# Patient Record
Sex: Male | Born: 1935
Health system: Southern US, Community
[De-identification: ages and names within clinical notes are randomized; demographics above are authoritative.]

## PROBLEM LIST (undated history)

## (undated) DIAGNOSIS — I2510 Atherosclerotic heart disease of native coronary artery without angina pectoris: Secondary | ICD-10-CM

## (undated) DIAGNOSIS — I639 Cerebral infarction, unspecified: Secondary | ICD-10-CM

## (undated) DIAGNOSIS — C61 Malignant neoplasm of prostate: Secondary | ICD-10-CM

## (undated) DIAGNOSIS — C801 Malignant (primary) neoplasm, unspecified: Secondary | ICD-10-CM

## (undated) DIAGNOSIS — I309 Acute pericarditis, unspecified: Secondary | ICD-10-CM

## (undated) DIAGNOSIS — I729 Aneurysm of unspecified site: Secondary | ICD-10-CM

## (undated) DIAGNOSIS — I10 Essential (primary) hypertension: Secondary | ICD-10-CM

## (undated) DIAGNOSIS — E7800 Pure hypercholesterolemia, unspecified: Secondary | ICD-10-CM

## (undated) DIAGNOSIS — I4821 Permanent atrial fibrillation: Secondary | ICD-10-CM

## (undated) DIAGNOSIS — I340 Nonrheumatic mitral (valve) insufficiency: Secondary | ICD-10-CM

## (undated) DIAGNOSIS — R7303 Prediabetes: Secondary | ICD-10-CM

## (undated) HISTORY — DX: Acute pericarditis, unspecified: I30.9

## (undated) HISTORY — DX: Nonrheumatic mitral (valve) insufficiency: I34.0

## (undated) HISTORY — DX: Cerebral infarction, unspecified: I63.9

## (undated) HISTORY — DX: Aneurysm of unspecified site: I72.9

## (undated) HISTORY — PX: SKIN BIOPSY: SHX1

## (undated) HISTORY — DX: Permanent atrial fibrillation: I48.21

## (undated) HISTORY — DX: Malignant neoplasm of prostate: C61

## (undated) HISTORY — DX: Atherosclerotic heart disease of native coronary artery without angina pectoris: I25.10

## (undated) HISTORY — PX: PROSTATE SURGERY: SHX751

---

## 2005-09-18 ENCOUNTER — Ambulatory Visit: Payer: Self-pay | Admitting: Gastroenterology

## 2005-11-09 ENCOUNTER — Ambulatory Visit: Payer: Self-pay | Admitting: Urology

## 2005-11-22 ENCOUNTER — Ambulatory Visit: Payer: Self-pay | Admitting: Radiation Oncology

## 2005-12-06 ENCOUNTER — Ambulatory Visit: Payer: Self-pay | Admitting: Radiation Oncology

## 2005-12-23 ENCOUNTER — Ambulatory Visit: Payer: Self-pay | Admitting: Radiation Oncology

## 2006-01-23 ENCOUNTER — Ambulatory Visit: Payer: Self-pay | Admitting: Radiation Oncology

## 2006-02-23 ENCOUNTER — Ambulatory Visit: Payer: Self-pay | Admitting: Radiation Oncology

## 2006-06-13 ENCOUNTER — Ambulatory Visit: Payer: Self-pay | Admitting: Radiation Oncology

## 2006-06-25 ENCOUNTER — Ambulatory Visit: Payer: Self-pay | Admitting: Radiation Oncology

## 2006-12-12 ENCOUNTER — Ambulatory Visit: Payer: Self-pay | Admitting: Radiation Oncology

## 2006-12-24 ENCOUNTER — Ambulatory Visit: Payer: Self-pay | Admitting: Radiation Oncology

## 2007-05-21 IMAGING — NM NUCLEAR MEDICINE WHOLE BODY BONE SCINTIGRAPHY
2 series · 6 of 6 positions shown · non-contrast
Comparison: none

REASON FOR EXAM: Prostate cancer
COMMENTS:

PROCEDURE:     NM  - NM BONE WB 3 HR [DATE]  [DATE]
RESULT:       [DATE] mCi Tc 99m MDP was administered.  No bony lesions are
identified.  Bilateral renal function excretion is present.

[Series 1: bone statics · 2.40mm/px · 2 acquisitions, 4 frames shown]
[im 1/2]
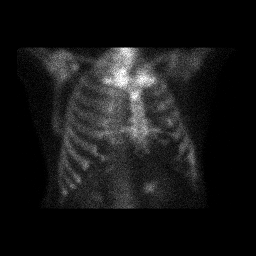
[im 1/2]
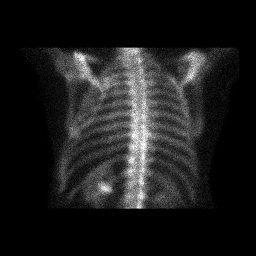
[im 2/2]
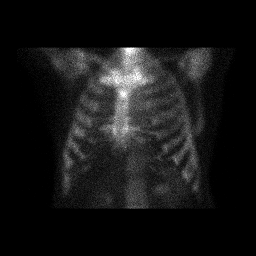
[im 2/2]
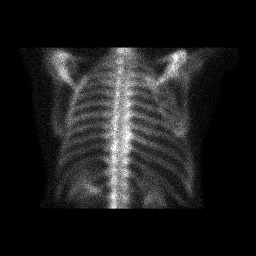

[Series 1: 3 hr wholebody · 2.40mm/px · 2 of 2 frames shown]
[frame 1/2]
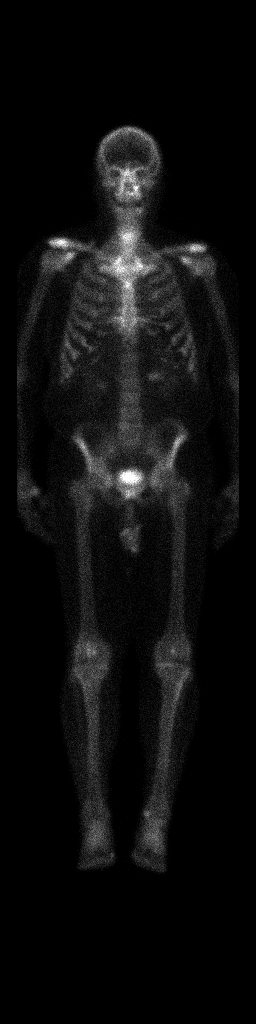
[frame 2/2]
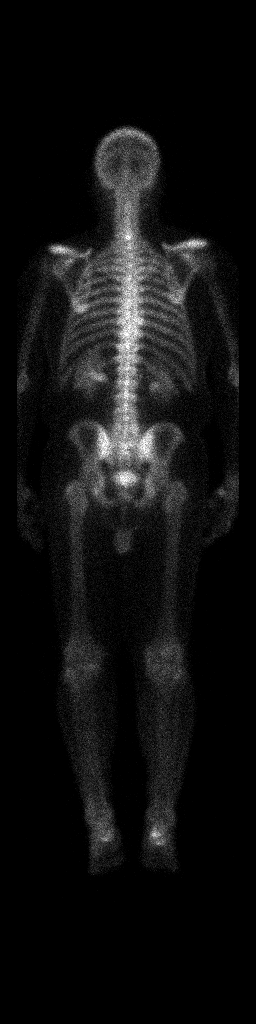

[6 of 6 positions shown; findings below may reference images not displayed]

IMPRESSION: Normal exam.  No focal abnormality is identified.
Specifically, there is no evidence of metastatic disease.

## 2007-06-12 ENCOUNTER — Ambulatory Visit: Payer: Self-pay | Admitting: Radiation Oncology

## 2007-06-17 IMAGING — CT CT GUIDANCE PLACEMENT RAD THERAPY FIELDS
1 series · 16 of 32 positions shown, 20 images · non-contrast
Comparison: none

[Series 2: tx planning · axial · 0.98mm/px · z∈[-80,+160]mm · 16 of 107 slices shown, 20 images]
[im 7/107  soft-tissue]
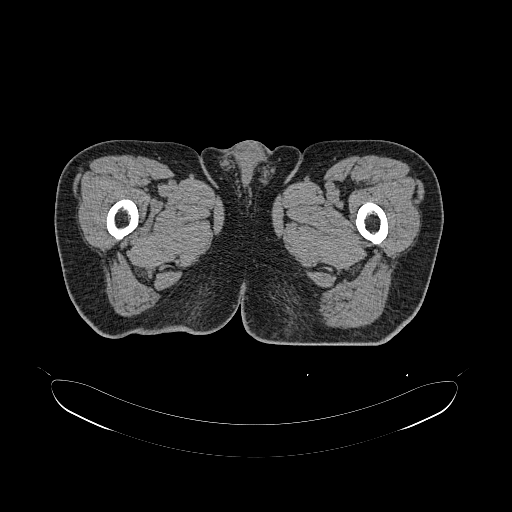
[im 7/107  bone]
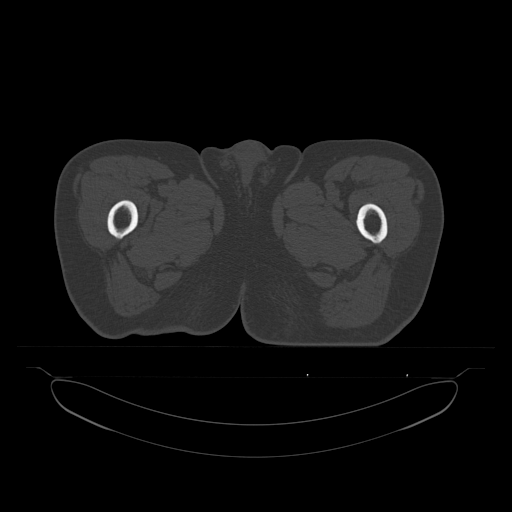
[im 14/107  soft-tissue]
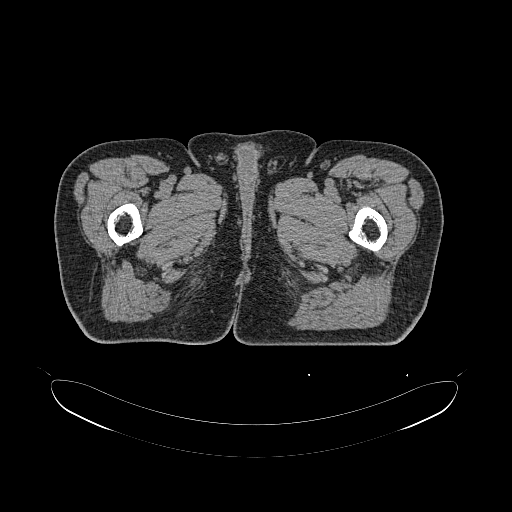
[im 21/107  soft-tissue]
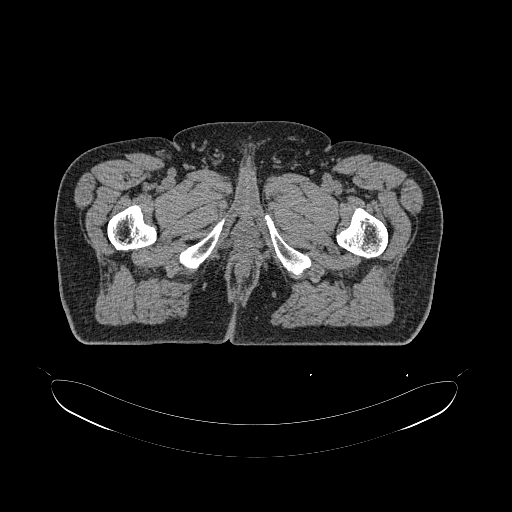
[im 28/107  soft-tissue]
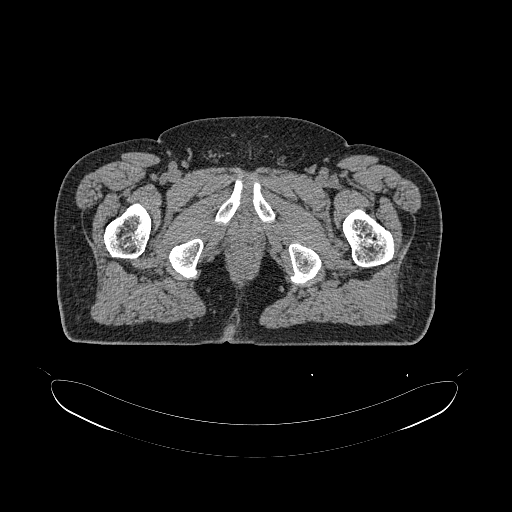
[im 35/107  soft-tissue]
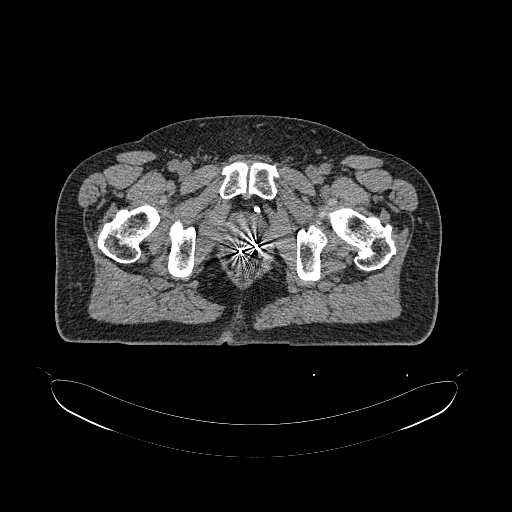
[im 42/107  soft-tissue]
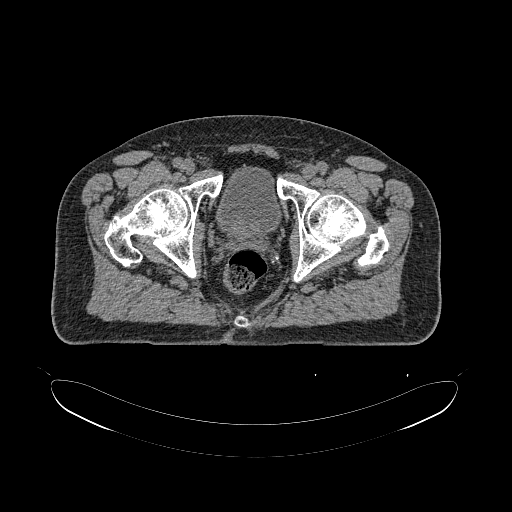
[im 48/107  soft-tissue]
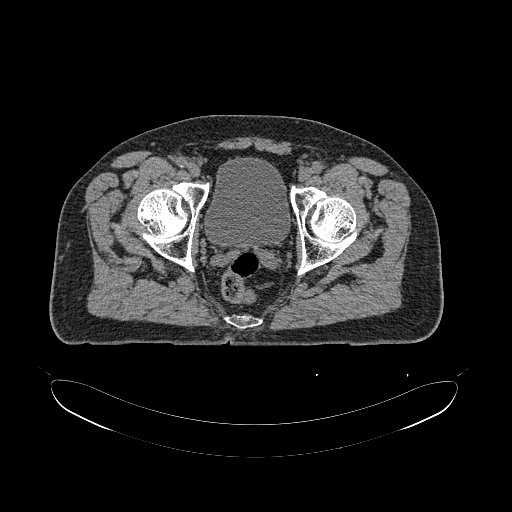
[im 59/107  soft-tissue]
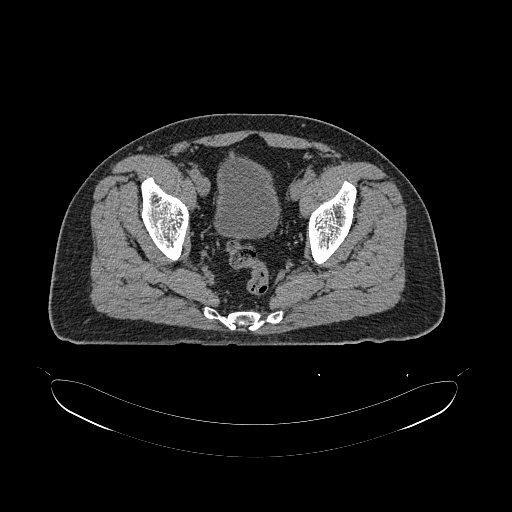
[im 65/107  soft-tissue]
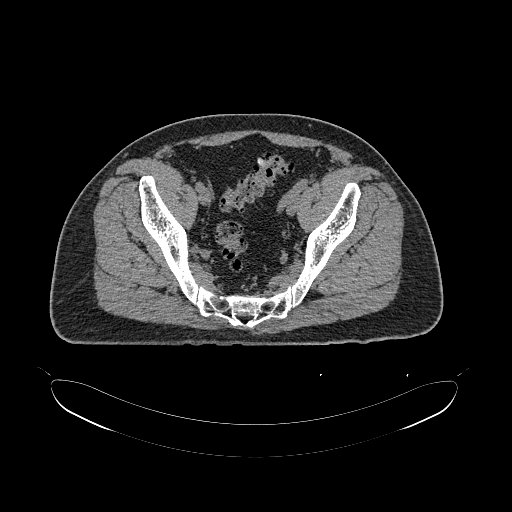
[im 65/107  bone]
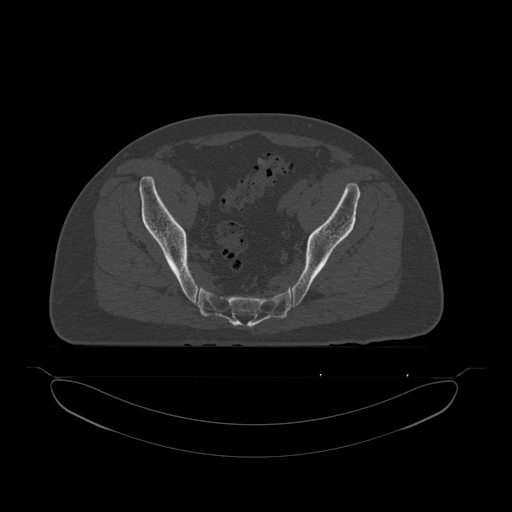
[im 72/107  soft-tissue]
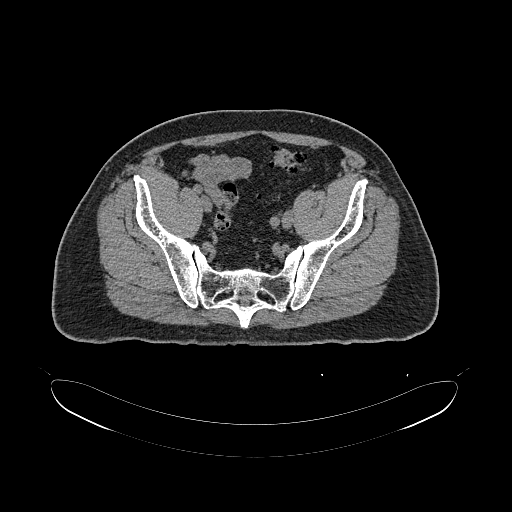
[im 79/107  soft-tissue]
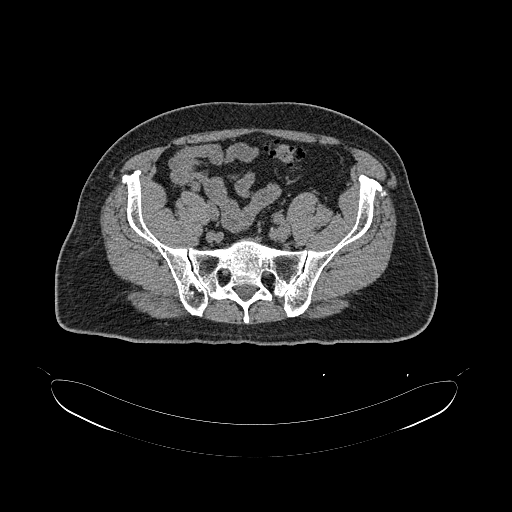
[im 86/107  soft-tissue]
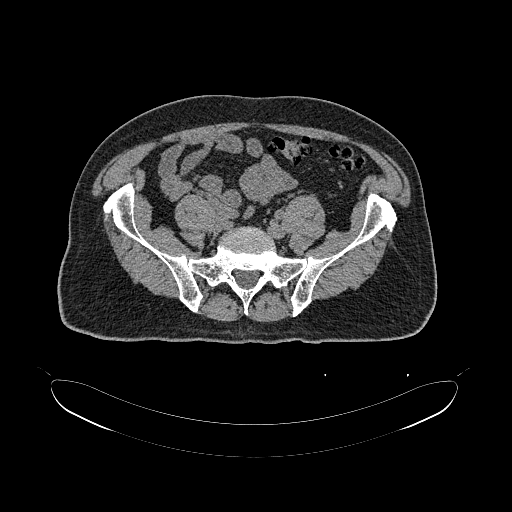
[im 93/107  soft-tissue]
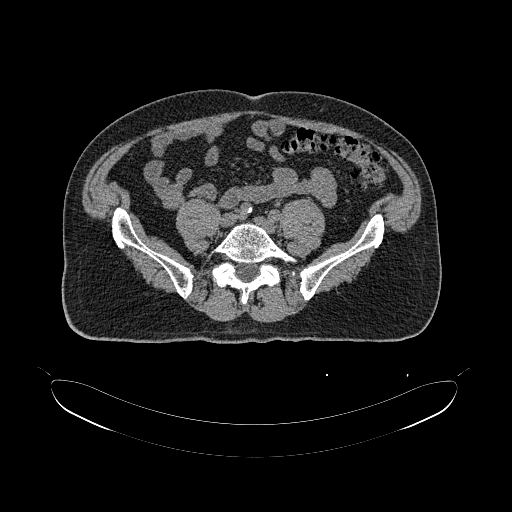
[im 93/107  lung]
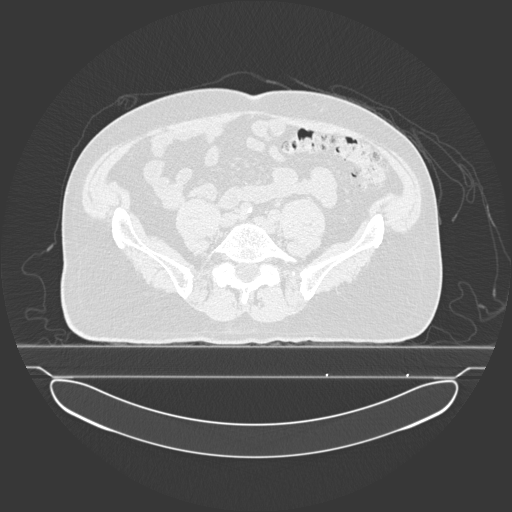
[im 96/107  lung]
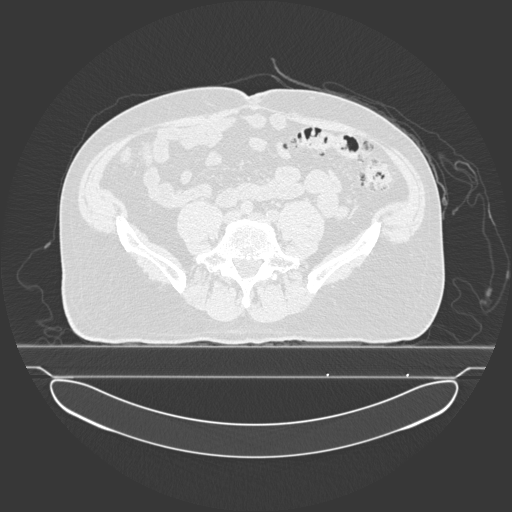
[im 100/107  soft-tissue]
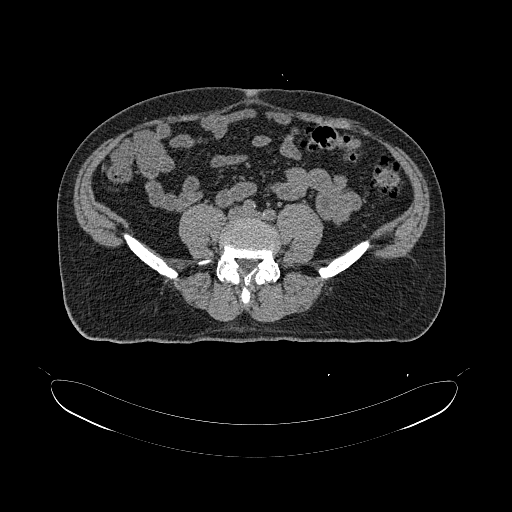
[im 100/107  lung]
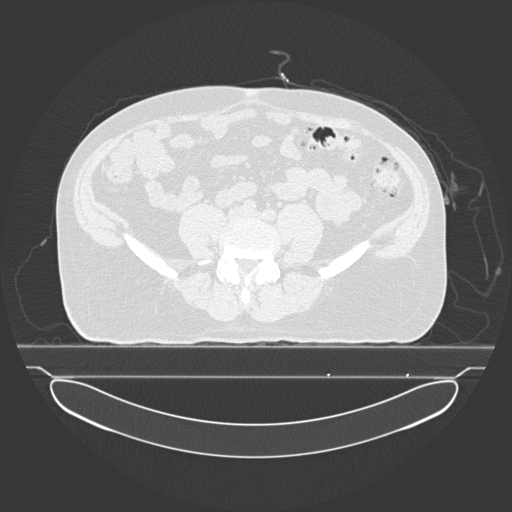
[im 103/107  lung]
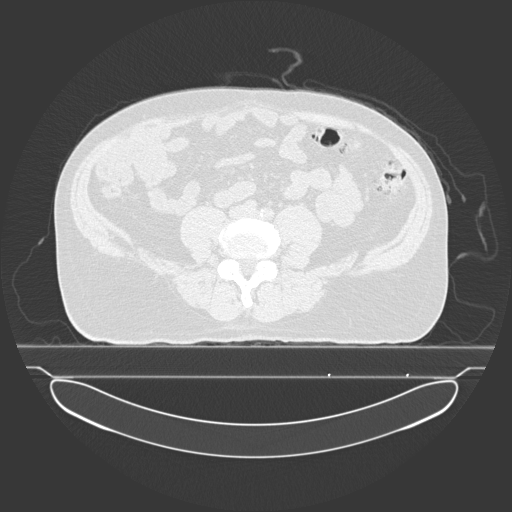

[16 of 32 positions shown; findings below may reference images not displayed]

IMAGES IMPORTED FROM THE [employer] WORKFLOW SYSTEM
NO DICTATION FOR STUDY

## 2007-06-26 ENCOUNTER — Ambulatory Visit: Payer: Self-pay | Admitting: Radiation Oncology

## 2007-11-24 ENCOUNTER — Ambulatory Visit: Payer: Self-pay | Admitting: Radiation Oncology

## 2008-06-25 ENCOUNTER — Ambulatory Visit: Payer: Self-pay | Admitting: Radiation Oncology

## 2008-07-02 ENCOUNTER — Ambulatory Visit: Payer: Self-pay | Admitting: Radiation Oncology

## 2008-07-26 ENCOUNTER — Ambulatory Visit: Payer: Self-pay | Admitting: Radiation Oncology

## 2009-04-27 ENCOUNTER — Ambulatory Visit: Payer: Self-pay | Admitting: Ophthalmology

## 2009-06-25 ENCOUNTER — Ambulatory Visit: Payer: Self-pay | Admitting: Radiation Oncology

## 2009-07-01 ENCOUNTER — Ambulatory Visit: Payer: Self-pay | Admitting: Radiation Oncology

## 2009-07-26 ENCOUNTER — Ambulatory Visit: Payer: Self-pay | Admitting: Radiation Oncology

## 2010-06-30 ENCOUNTER — Ambulatory Visit: Payer: Self-pay | Admitting: Radiation Oncology

## 2010-07-01 LAB — PSA

## 2010-07-26 ENCOUNTER — Ambulatory Visit: Payer: Self-pay | Admitting: Radiation Oncology

## 2012-07-09 DIAGNOSIS — N529 Male erectile dysfunction, unspecified: Secondary | ICD-10-CM | POA: Insufficient documentation

## 2012-07-14 DIAGNOSIS — Z8546 Personal history of malignant neoplasm of prostate: Secondary | ICD-10-CM | POA: Insufficient documentation

## 2014-04-21 DIAGNOSIS — R7303 Prediabetes: Secondary | ICD-10-CM | POA: Insufficient documentation

## 2014-04-21 DIAGNOSIS — E785 Hyperlipidemia, unspecified: Secondary | ICD-10-CM | POA: Insufficient documentation

## 2014-04-21 DIAGNOSIS — I10 Essential (primary) hypertension: Secondary | ICD-10-CM | POA: Insufficient documentation

## 2015-08-08 DIAGNOSIS — R3129 Other microscopic hematuria: Secondary | ICD-10-CM | POA: Diagnosis not present

## 2015-08-08 DIAGNOSIS — Z8546 Personal history of malignant neoplasm of prostate: Secondary | ICD-10-CM | POA: Diagnosis not present

## 2015-08-29 DIAGNOSIS — D485 Neoplasm of uncertain behavior of skin: Secondary | ICD-10-CM | POA: Diagnosis not present

## 2015-08-29 DIAGNOSIS — Z85828 Personal history of other malignant neoplasm of skin: Secondary | ICD-10-CM | POA: Diagnosis not present

## 2015-08-29 DIAGNOSIS — Z872 Personal history of diseases of the skin and subcutaneous tissue: Secondary | ICD-10-CM | POA: Diagnosis not present

## 2015-08-29 DIAGNOSIS — Z08 Encounter for follow-up examination after completed treatment for malignant neoplasm: Secondary | ICD-10-CM | POA: Diagnosis not present

## 2015-08-29 DIAGNOSIS — C44212 Basal cell carcinoma of skin of right ear and external auricular canal: Secondary | ICD-10-CM | POA: Diagnosis not present

## 2015-08-29 DIAGNOSIS — L570 Actinic keratosis: Secondary | ICD-10-CM | POA: Diagnosis not present

## 2015-08-29 DIAGNOSIS — Z1283 Encounter for screening for malignant neoplasm of skin: Secondary | ICD-10-CM | POA: Diagnosis not present

## 2015-09-06 DIAGNOSIS — Z8546 Personal history of malignant neoplasm of prostate: Secondary | ICD-10-CM | POA: Diagnosis not present

## 2015-09-06 DIAGNOSIS — N281 Cyst of kidney, acquired: Secondary | ICD-10-CM | POA: Diagnosis not present

## 2015-09-06 DIAGNOSIS — R3129 Other microscopic hematuria: Secondary | ICD-10-CM | POA: Diagnosis not present

## 2015-09-06 DIAGNOSIS — N35919 Unspecified urethral stricture, male, unspecified site: Secondary | ICD-10-CM | POA: Insufficient documentation

## 2015-09-06 DIAGNOSIS — R319 Hematuria, unspecified: Secondary | ICD-10-CM | POA: Diagnosis not present

## 2015-09-28 DIAGNOSIS — N509 Disorder of male genital organs, unspecified: Secondary | ICD-10-CM | POA: Diagnosis not present

## 2015-09-28 DIAGNOSIS — Z01818 Encounter for other preprocedural examination: Secondary | ICD-10-CM | POA: Diagnosis not present

## 2015-09-28 DIAGNOSIS — R319 Hematuria, unspecified: Secondary | ICD-10-CM | POA: Diagnosis not present

## 2015-10-05 DIAGNOSIS — R319 Hematuria, unspecified: Secondary | ICD-10-CM | POA: Diagnosis not present

## 2015-10-05 DIAGNOSIS — N359 Urethral stricture, unspecified: Secondary | ICD-10-CM | POA: Diagnosis not present

## 2015-10-05 DIAGNOSIS — N4889 Other specified disorders of penis: Secondary | ICD-10-CM | POA: Diagnosis not present

## 2015-10-05 DIAGNOSIS — I10 Essential (primary) hypertension: Secondary | ICD-10-CM | POA: Diagnosis not present

## 2015-10-05 DIAGNOSIS — N489 Disorder of penis, unspecified: Secondary | ICD-10-CM | POA: Diagnosis not present

## 2015-10-05 DIAGNOSIS — N358 Other urethral stricture: Secondary | ICD-10-CM | POA: Diagnosis not present

## 2015-10-05 DIAGNOSIS — R001 Bradycardia, unspecified: Secondary | ICD-10-CM | POA: Diagnosis not present

## 2015-10-05 DIAGNOSIS — R3121 Asymptomatic microscopic hematuria: Secondary | ICD-10-CM | POA: Diagnosis not present

## 2015-10-05 DIAGNOSIS — N4829 Other inflammatory disorders of penis: Secondary | ICD-10-CM | POA: Diagnosis not present

## 2015-10-05 DIAGNOSIS — Z880 Allergy status to penicillin: Secondary | ICD-10-CM | POA: Diagnosis not present

## 2015-10-05 DIAGNOSIS — E785 Hyperlipidemia, unspecified: Secondary | ICD-10-CM | POA: Diagnosis not present

## 2015-10-12 DIAGNOSIS — C44212 Basal cell carcinoma of skin of right ear and external auricular canal: Secondary | ICD-10-CM | POA: Diagnosis not present

## 2015-10-20 DIAGNOSIS — R319 Hematuria, unspecified: Secondary | ICD-10-CM | POA: Diagnosis not present

## 2015-10-20 DIAGNOSIS — Z9889 Other specified postprocedural states: Secondary | ICD-10-CM | POA: Diagnosis not present

## 2015-11-03 ENCOUNTER — Ambulatory Visit
Admission: EM | Admit: 2015-11-03 | Discharge: 2015-11-03 | Disposition: A | Discharge status: Home / Self Care | Payer: PPO | Attending: Family Medicine | Admitting: Family Medicine

## 2015-11-03 ENCOUNTER — Encounter: Payer: Self-pay | Admitting: Gynecology

## 2015-11-03 DIAGNOSIS — L089 Local infection of the skin and subcutaneous tissue, unspecified: Secondary | ICD-10-CM

## 2015-11-03 DIAGNOSIS — S90561A Insect bite (nonvenomous), right ankle, initial encounter: Secondary | ICD-10-CM | POA: Diagnosis not present

## 2015-11-03 DIAGNOSIS — W57XXXA Bitten or stung by nonvenomous insect and other nonvenomous arthropods, initial encounter: Principal | ICD-10-CM

## 2015-11-03 HISTORY — DX: Malignant (primary) neoplasm, unspecified: C80.1

## 2015-11-03 HISTORY — DX: Pure hypercholesterolemia, unspecified: E78.00

## 2015-11-03 HISTORY — DX: Essential (primary) hypertension: I10

## 2015-11-03 HISTORY — DX: Prediabetes: R73.03

## 2015-11-03 MED ORDER — DOXYCYCLINE HYCLATE 100 MG PO CAPS: 100.0000 mg | ORAL_CAPSULE | Freq: Two times a day (BID) | ORAL | Status: DC

## 2015-11-03 MED ORDER — MUPIROCIN 2 % EX OINT: 1.0000 "application " | TOPICAL_OINTMENT | Freq: Three times a day (TID) | CUTANEOUS | Status: DC

## 2015-11-03 NOTE — Discharge Instructions (Signed)
Tick Bite Information Ticks are insects that attach themselves to the skin and draw blood for food. There are various types of ticks. Common types include wood ticks and deer ticks. Most ticks live in shrubs and grassy areas. Ticks can climb onto your body when you make contact with leaves or grass where the tick is waiting. The most common places on the body for ticks to attach themselves are the scalp, neck, armpits, waist, and groin. Most tick bites are harmless, but sometimes ticks carry germs that cause diseases. These germs can be spread to a person during the tick's feeding process. The chance of a disease spreading through a tick bite depends on:   The type of tick.  Time of year.   How long the tick is attached.   Geographic location.  HOW CAN YOU PREVENT TICK BITES? Take these steps to help prevent tick bites when you are outdoors:  Wear protective clothing. Long sleeves and long pants are best.   Wear white clothes so you can see ticks more easily.  Tuck your pant legs into your socks.   If walking on a trail, stay in the middle of the trail to avoid brushing against bushes.  Avoid walking through areas with long grass.  Put insect repellent on all exposed skin and along boot tops, pant legs, and sleeve cuffs.   Check clothing, hair, and skin repeatedly and before going inside.   Brush off any ticks that are not attached.  Take a shower or bath as soon as possible after being outdoors.  WHAT IS THE PROPER WAY TO REMOVE A TICK? Ticks should be removed as soon as possible to help prevent diseases caused by tick bites. 1. If latex gloves are available, put them on before trying to remove a tick.  2. Using fine-point tweezers, grasp the tick as close to the skin as possible. You may also use curved forceps or a tick removal tool. Grasp the tick as close to its head as possible. Avoid grasping the tick on its body. 3. Pull gently with steady upward pressure until  the tick lets go. Do not twist the tick or jerk it suddenly. This may break off the tick's head or mouth parts. 4. Do not squeeze or crush the tick's body. This could force disease-carrying fluids from the tick into your body.  5. After the tick is removed, wash the bite area and your hands with soap and water or other disinfectant such as alcohol. 6. Apply a small amount of antiseptic cream or ointment to the bite site.  7. Wash and disinfect any instruments that were used.  Do not try to remove a tick by applying a hot match, petroleum jelly, or fingernail polish to the tick. These methods do not work and may increase the chances of disease being spread from the tick bite.  WHEN SHOULD YOU SEEK MEDICAL CARE? Contact your health care provider if you are unable to remove a tick from your skin or if a part of the tick breaks off and is stuck in the skin.  After a tick bite, you need to be aware of signs and symptoms that could be related to diseases spread by ticks. Contact your health care provider if you develop any of the following in the days or weeks after the tick bite:  Unexplained fever.  Rash. A circular rash that appears days or weeks after the tick bite may indicate the possibility of Lyme disease. The rash may resemble   a target with a bull's-eye and may occur at a different part of your body than the tick bite.  Redness and swelling in the area of the tick bite.   Tender, swollen lymph glands.   Diarrhea.   Weight loss.   Cough.   Fatigue.   Muscle, joint, or bone pain.   Abdominal pain.   Headache.   Lethargy or a change in your level of consciousness.  Difficulty walking or moving your legs.   Numbness in the legs.   Paralysis.  Shortness of breath.   Confusion.   Repeated vomiting.    This information is not intended to replace advice given to you by your health care provider. Make sure you discuss any questions you have with your health  care provider.   Document Released: 06/08/2000 Document Revised: 07/02/2014 Document Reviewed: 11/19/2012 Elsevier Interactive Patient Education 2016 [employer]  

## 2015-11-03 NOTE — ED Provider Notes (Signed)
CSN: [id number]     Arrival date & time 11/03/15  [id number] History   First MD Initiated Contact with Patient 11/03/15 0945     Chief Complaint  Patient presents with  . Insect Bite   (Consider location/radiation/quality/duration/timing/severity/associated sxs/prior Treatment) HPI  82 year old male presents with a rash that is blanchable on the medial surface of his left distal lower leg just above the ankle. The patient states that he played in a golfing tournament on Friday and Saturday of last week had a couple of shots in the woods. He also spends a lot of time in the woods around his house and around a bird feeder that has high grass. He states he found the tick which was very small about 4 days ago and removed it. Shortly afterwards he began to have the blanchable rash and a red welt were the tick was removed. States that he is almost certain that the tick was in place for at least 4 days. He's had no fever is afebrile today at 97.6. Did not use a bug spray states that he will be doing that from now on.   Past Medical History  Diagnosis Date  . Hypertension   . Hypercholesteremia   . Pre-diabetes   . Cancer [facility])     prostate   Past Surgical History  Procedure Laterality Date  . Prostate surgery    . Skin biopsy      basel cell   No family history on file. Social History  Substance Use Topics  . Smoking status: Never Smoker   . Smokeless tobacco: None  . Alcohol Use: Yes    Review of Systems  Constitutional: Negative for fever, chills, activity change and fatigue.  Skin: Positive for color change, rash and wound.  All other systems reviewed and are negative.   Allergies  Penicillins  Home Medications   Prior to Admission medications   Medication Sig Start Date End Date Taking? Authorizing Provider  hydrochlorothiazide (HYDRODIURIL) 25 MG tablet Take 25 mg by mouth daily.   Yes Historical Provider, MD  ibuprofen (ADVIL,MOTRIN) 200 MG tablet Take 200 mg by mouth every 6  (six) hours as needed.   Yes Historical Provider, MD  lisinopril (PRINIVIL,ZESTRIL) 20 MG tablet Take 20 mg by mouth daily.   Yes Historical Provider, MD  simvastatin (ZOCOR) 20 MG tablet Take 20 mg by mouth daily.   Yes Historical Provider, MD  doxycycline (VIBRAMYCIN) 100 MG capsule Take 1 capsule (100 mg total) by mouth 2 (two) times daily. 11/03/15   Lorin Picket, PA-C  mupirocin ointment (BACTROBAN) 2 % Apply 1 application topically 3 (three) times daily. 11/03/15   Lorin Picket, PA-C   Meds Ordered and Administered this Visit  Medications - No data to display  BP 128/79 mmHg  Pulse 80  Temp(Src) 97.6 F (36.4 C) (Oral)  Resp 16  Ht 5\' 8"  (1.727 m)  Wt 210 lb (95.255 kg)  BMI 31.94 kg/m2  SpO2 98% No data found.   Physical Exam  Constitutional: He is oriented to person, place, and time. He appears well-nourished. No distress.  HENT:  Head: Normocephalic and atraumatic.  Eyes: Conjunctivae are normal. Pupils are equal, round, and reactive to light.  Neck: Normal range of motion. Neck supple.  Musculoskeletal: Normal range of motion. He exhibits edema and tenderness.  Lymphadenopathy:    He has no cervical adenopathy.  Neurological: He is alert and oriented to person, place, and time.  Skin: Skin is warm and  dry. Rash noted. He is not diaphoretic. There is erythema.  Examination of the distal right leg medially just proximal to the ankle shows a small well with surrounding blanchable erythema. There is some mild swelling present. There is no drainage from the bite is some induration below measuring approximately 1/2-3/4 of a centimeter.  Psychiatric: He has a normal mood and affect. His behavior is normal. Judgment and thought content normal.  Nursing note and vitals reviewed.   ED Course  Procedures (including critical care time)  Labs Review Labs Reviewed - No data to display  Imaging Review No results found.   Visual Acuity Review  Right Eye Distance:    Left Eye Distance:   Bilateral Distance:    Right Eye Near:   Left Eye Near:    Bilateral Near:         MDM   1. Insect bite of ankle, right, infected, initial encounter    Discharge Medication List as of 11/03/2015 10:05 AM    START taking these medications   Details  doxycycline (VIBRAMYCIN) 100 MG capsule Take 1 capsule (100 mg total) by mouth 2 (two) times daily., Starting 11/03/2015, Until Discontinued, Normal    mupirocin ointment (BACTROBAN) 2 % Apply 1 application topically 3 (three) times daily., Starting 11/03/2015, Until Discontinued, Normal      Plan: 1. Test/x-ray results and diagnosis reviewed with patient 2. rx as per orders; risks, benefits, potential side effects reviewed with patient 3. Recommend supportive treatment with Compresses and Bactroban to the area 3 times a day to 4 times a day. Because we are uncertain of the exact amount time that the tick was feeding we will place him on doxycycline prophylactically. I have cautioned him on the use of bug spray whenever he is out of doors. Is particularly of crucial when he is golfing hour in the woods. He is having any further problems or concerns he should follow-up either with Dr. Elveria Rising dermatologist or his primary care physician. 4. F/u prn if symptoms worsen or don't improve     Lorin Picket, PA-C 11/03/15 1046

## 2015-11-03 NOTE — ED Notes (Signed)
Patient stated tick bite on right lower leg x 4 days ago. Per patient remove tick , but now with redness and rash around area.

## 2015-11-16 DIAGNOSIS — E7800 Pure hypercholesterolemia, unspecified: Secondary | ICD-10-CM | POA: Diagnosis not present

## 2015-11-16 DIAGNOSIS — I10 Essential (primary) hypertension: Secondary | ICD-10-CM | POA: Diagnosis not present

## 2015-11-16 DIAGNOSIS — R7303 Prediabetes: Secondary | ICD-10-CM | POA: Diagnosis not present

## 2015-12-07 ENCOUNTER — Ambulatory Visit (INDEPENDENT_AMBULATORY_CARE_PROVIDER_SITE_OTHER): Payer: PPO | Admitting: Family Medicine

## 2015-12-07 ENCOUNTER — Encounter: Payer: Self-pay | Admitting: Family Medicine

## 2015-12-07 VITALS — BP 124/80 | HR 72 | Resp 16 | Ht 68.0 in | Wt 217.0 lb

## 2015-12-07 DIAGNOSIS — R2681 Unsteadiness on feet: Secondary | ICD-10-CM | POA: Diagnosis not present

## 2015-12-07 DIAGNOSIS — D649 Anemia, unspecified: Secondary | ICD-10-CM | POA: Insufficient documentation

## 2015-12-07 DIAGNOSIS — Z8546 Personal history of malignant neoplasm of prostate: Secondary | ICD-10-CM | POA: Diagnosis not present

## 2015-12-07 DIAGNOSIS — I10 Essential (primary) hypertension: Secondary | ICD-10-CM

## 2015-12-07 DIAGNOSIS — E669 Obesity, unspecified: Secondary | ICD-10-CM

## 2015-12-07 DIAGNOSIS — Z23 Encounter for immunization: Secondary | ICD-10-CM | POA: Diagnosis not present

## 2015-12-07 DIAGNOSIS — E785 Hyperlipidemia, unspecified: Secondary | ICD-10-CM | POA: Diagnosis not present

## 2015-12-07 DIAGNOSIS — R3129 Other microscopic hematuria: Secondary | ICD-10-CM | POA: Diagnosis not present

## 2015-12-07 DIAGNOSIS — R7303 Prediabetes: Secondary | ICD-10-CM | POA: Diagnosis not present

## 2015-12-07 MED ORDER — ZOSTER VACCINE LIVE 19400 UNT/0.65ML ~~LOC~~ SUSR: 0.6500 mL | Freq: Once | SUBCUTANEOUS | Status: DC

## 2015-12-08 LAB — VITAMIN D 25 HYDROXY (VIT D DEFICIENCY, FRACTURES): VIT D 25 HYDROXY: 22.5 ng/mL — AB (ref 30.0–100.0)

## 2015-12-08 LAB — VITAMIN B12: Vitamin B-12: 340 pg/mL (ref 211–946)

## 2015-12-09 ENCOUNTER — Other Ambulatory Visit: Payer: Self-pay | Admitting: Family Medicine

## 2015-12-09 DIAGNOSIS — Z8546 Personal history of malignant neoplasm of prostate: Secondary | ICD-10-CM | POA: Insufficient documentation

## 2015-12-09 DIAGNOSIS — E559 Vitamin D deficiency, unspecified: Secondary | ICD-10-CM

## 2015-12-09 MED ORDER — ACETAMINOPHEN 500 MG PO TABS: 500.0000 mg | ORAL_TABLET | Freq: Four times a day (QID) | ORAL | Status: AC | PRN

## 2015-12-09 MED ORDER — VITAMIN D 50 MCG (2000 UT) PO CAPS: 1.0000 | ORAL_CAPSULE | Freq: Every day | ORAL | Status: AC

## 2015-12-09 NOTE — Progress Notes (Signed)
Date:  12/07/2015   Name:  Jonathan [name]   DOB:  06-07-1934   MRN:  [id number]  PCP:  No primary care provider on file.    Chief Complaint: Establish Care   History of Present Illness:  This is a 82 y.o. male seen for initial visit. Seen [place] for tick bite last month, rx'd with doxy/Bactroban. HTN on lisinopril/HCTZ, HLD on Zocor. Hx prediabetes, chronic normocytic anemia. Hx prostate ca and persistent microscopic hematuria, followed yearly by urology. S/p cataract surgery, followed yearly by ophtho. Sees derm q63m for BCC/SCC. Father died heart dz, mother old age. Tet imm < 5 yrs ago, never had zoster or pneumo imms. Colonoscopy 8 yrs ago ok. Has BLE edema that improves overnight.  Review of Systems:  Review of Systems  Constitutional: Negative for fever and fatigue.  Respiratory: Negative for cough and shortness of breath.   Cardiovascular: Negative for chest pain.  Endocrine: Negative for polyuria.  Genitourinary: Negative for difficulty urinating.  Neurological: Negative for syncope and light-headedness.    Patient Active Problem List   Diagnosis Date Noted  . Gait instability 12/07/2015  . Obesity (BMI 30.0-34.9) 12/07/2015  . Chronic anemia 12/07/2015  . 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 Microscopic hematuria 09/06/2015  . Benign hypertension 04/21/2014  . Hyperlipidemia 04/21/2014  . Prediabetes 04/21/2014  . H/O malignant neoplasm of prostate 07/14/2012  . ED (erectile dysfunction) of organic origin 07/09/2012    Prior to Admission medications   Medication Sig Start Date End Date Taking? Authorizing Provider  hydrochlorothiazide (HYDRODIURIL) 25 MG tablet Take 25 mg by mouth daily.   Yes Historical Provider, MD  ibuprofen (ADVIL,MOTRIN) 200 MG tablet Take 200 mg by mouth every 6 (six) hours as needed.   Yes Historical Provider, MD  lisinopril (PRINIVIL,ZESTRIL) 20 MG tablet Take 20 mg by mouth daily.   Yes Historical Provider, MD  simvastatin (ZOCOR) 20 MG tablet Take 20 mg  by mouth daily.   Yes Historical Provider, MD  Zoster Vaccine Live, PF, (ZOSTAVAX) 16109 UNT/0.65ML injection Inject 19,400 Units into the skin once. 12/07/15   Adline Potter, MD    Allergies  Allergen Reactions  . Penicillin V Potassium Other (See Comments)  . Penicillins Rash    Past Surgical History  Procedure Laterality Date  . Prostate surgery    . Skin biopsy      basel cell    Social History  Substance Use Topics  . Smoking status: Never Smoker   . Smokeless tobacco: None  . Alcohol Use: Yes    No family history on file.  Medication list has been reviewed and updated.  Physical Examination: BP 124/80 mmHg  Pulse 72  Resp 16  Ht 5\' 8"  (1.727 m)  Wt 217 lb (98.431 kg)  BMI 33.00 kg/m2  SpO2 97%  Physical Exam  Constitutional: He is oriented to person, place, and time. He appears well-developed and well-nourished.  HENT:  Right Ear: External ear normal.  Left Ear: External ear normal.  Nose: Nose normal.  Mouth/Throat: Oropharynx is clear and moist.  TM's clear  Eyes: Conjunctivae and EOM are normal. Pupils are equal, round, and reactive to light.  Neck: Neck supple. No thyromegaly present.  Cardiovascular: Normal rate, regular rhythm, normal heart sounds and intact distal pulses.   Pulmonary/Chest: Effort normal and breath sounds normal.  Abdominal: Soft. He exhibits no distension and no mass. There is no tenderness.  Moderate midline ventral hernia  Musculoskeletal:  1+ BLE edema  Lymphadenopathy:    He  has no cervical adenopathy.  Neurological: He is alert and oriented to person, place, and time.  Gait slightly wide-based  Skin: Skin is warm and dry.  Psychiatric: He has a normal mood and affect. His behavior is normal.  Nursing note and vitals reviewed.   Assessment and Plan:  1. Benign hypertension Well controlled on lisinopril/HCTZ  2. Prediabetes A1c 5.8% last month  3. Hyperlipidemia Well controlled on Zocor (LDL 91 last month)  4.  Gait instability Consider PT referral next visit - B12  5. Chronic anemia H/H 13.5/39.5 last month  6. Obesity (BMI 30.0-34.9) Exercise/weight loss discussed - Vitamin D (25 hydroxy)  7. Need for pneumococcal vaccination Prevnar given, Zostavax ordered  8. History of prostate cancer Followed by urology  9. Microscopic hematuria Followed by urology  Return in about 6 months (around 06/07/2016).  [name]. [facility]  12/09/2015

## 2016-04-11 DIAGNOSIS — D485 Neoplasm of uncertain behavior of skin: Secondary | ICD-10-CM | POA: Diagnosis not present

## 2016-04-11 DIAGNOSIS — Z1283 Encounter for screening for malignant neoplasm of skin: Secondary | ICD-10-CM | POA: Diagnosis not present

## 2016-04-11 DIAGNOSIS — L821 Other seborrheic keratosis: Secondary | ICD-10-CM | POA: Diagnosis not present

## 2016-04-11 DIAGNOSIS — Z872 Personal history of diseases of the skin and subcutaneous tissue: Secondary | ICD-10-CM | POA: Diagnosis not present

## 2016-04-11 DIAGNOSIS — Z08 Encounter for follow-up examination after completed treatment for malignant neoplasm: Secondary | ICD-10-CM | POA: Diagnosis not present

## 2016-04-11 DIAGNOSIS — Z85828 Personal history of other malignant neoplasm of skin: Secondary | ICD-10-CM | POA: Diagnosis not present

## 2016-04-11 DIAGNOSIS — C44311 Basal cell carcinoma of skin of nose: Secondary | ICD-10-CM | POA: Diagnosis not present

## 2016-04-11 DIAGNOSIS — L570 Actinic keratosis: Secondary | ICD-10-CM | POA: Diagnosis not present

## 2016-04-25 DIAGNOSIS — C44311 Basal cell carcinoma of skin of nose: Secondary | ICD-10-CM | POA: Diagnosis not present

## 2016-06-08 ENCOUNTER — Ambulatory Visit (INDEPENDENT_AMBULATORY_CARE_PROVIDER_SITE_OTHER): Payer: PPO | Admitting: Family Medicine

## 2016-06-08 ENCOUNTER — Encounter: Payer: Self-pay | Admitting: Family Medicine

## 2016-06-08 VITALS — BP 132/82 | HR 68 | Resp 16 | Ht 68.0 in | Wt 217.0 lb

## 2016-06-08 DIAGNOSIS — I10 Essential (primary) hypertension: Secondary | ICD-10-CM | POA: Diagnosis not present

## 2016-06-08 DIAGNOSIS — M1990 Unspecified osteoarthritis, unspecified site: Secondary | ICD-10-CM | POA: Insufficient documentation

## 2016-06-08 DIAGNOSIS — M170 Bilateral primary osteoarthritis of knee: Secondary | ICD-10-CM | POA: Diagnosis not present

## 2016-06-08 DIAGNOSIS — D649 Anemia, unspecified: Secondary | ICD-10-CM

## 2016-06-08 DIAGNOSIS — E559 Vitamin D deficiency, unspecified: Secondary | ICD-10-CM | POA: Diagnosis not present

## 2016-06-08 DIAGNOSIS — S8011XA Contusion of right lower leg, initial encounter: Secondary | ICD-10-CM

## 2016-06-08 DIAGNOSIS — R7303 Prediabetes: Secondary | ICD-10-CM

## 2016-06-08 DIAGNOSIS — E669 Obesity, unspecified: Secondary | ICD-10-CM | POA: Diagnosis not present

## 2016-06-08 DIAGNOSIS — E785 Hyperlipidemia, unspecified: Secondary | ICD-10-CM | POA: Diagnosis not present

## 2016-06-08 DIAGNOSIS — E66811 Obesity, class 1: Secondary | ICD-10-CM

## 2016-06-08 MED ORDER — HYDROCHLOROTHIAZIDE 25 MG PO TABS: 25.0000 mg | ORAL_TABLET | Freq: Every day | ORAL | 3 refills | Status: DC

## 2016-06-08 MED ORDER — LISINOPRIL 20 MG PO TABS: 20.0000 mg | ORAL_TABLET | Freq: Every day | ORAL | 3 refills | Status: DC

## 2016-06-08 NOTE — Progress Notes (Signed)
Date:  06/08/2016   Name:  Jonathan [name]   DOB:  03/08/1934   MRN:  [id number]  PCP:  No primary care provider on file.    Chief Complaint: Hypertension   History of Present Illness:  This is a 82 y.o. male seen for six month f/u. HTN well controlled on lisinopril/HCTZ, HLD on Zocor (LDL 98, no established CVD), prediabetes stable (last a1c 5.9%). Weight about the same, going to gym more. C/o intermittent B knee pain. Taking vit D supplement. One month ago hit R lower leg, hematoma with distal migration or ecchymosis, both improving.  Review of Systems:  Review of Systems  Constitutional: Negative for appetite change, fatigue, fever and unexpected weight change.  Respiratory: Negative for cough and shortness of breath.   Cardiovascular: Negative for chest pain and leg swelling.  Gastrointestinal: Negative for abdominal pain.  Neurological: Negative for dizziness, syncope and light-headedness.  Hematological: Negative for adenopathy.    Patient Active Problem List   Diagnosis Date Noted  . History of prostate cancer 12/09/2015  . Vitamin D deficiency 12/09/2015  . Gait instability 12/07/2015  . Obesity (BMI 30.0-34.9) 12/07/2015  . Chronic anemia 12/07/2015  . 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 Microscopic hematuria 09/06/2015  . Benign hypertension 04/21/2014  . Hyperlipidemia 04/21/2014  . Prediabetes 04/21/2014  . ED (erectile dysfunction) of organic origin 07/09/2012    Prior to Admission medications   Medication Sig Start Date End Date Taking? Authorizing Provider  acetaminophen (TYLENOL) 500 MG tablet Take 1 tablet (500 mg total) by mouth every 6 (six) hours as needed. 12/09/15  Yes Adline Potter, MD  Cholecalciferol (VITAMIN D) 2000 units CAPS Take 1 capsule (2,000 Units total) by mouth daily. 12/09/15  Yes Adline Potter, MD  hydrochlorothiazide (HYDRODIURIL) 25 MG tablet Take 25 mg by mouth daily.   Yes Historical Provider, MD  lisinopril (PRINIVIL,ZESTRIL) 20 MG tablet Take  20 mg by mouth daily.   Yes Historical Provider, MD  simvastatin (ZOCOR) 20 MG tablet Take 20 mg by mouth daily.   Yes Historical Provider, MD    Allergies  Allergen Reactions  . Penicillin V Potassium Other (See Comments)  . Penicillins Rash    Past Surgical History:  Procedure Laterality Date  . PROSTATE SURGERY    . SKIN BIOPSY     basel cell    Social History  Substance Use Topics  . Smoking status: Never Smoker  . Smokeless tobacco: Never Used  . Alcohol use Yes    History reviewed. No pertinent family history.  Medication list has been reviewed and updated.  Physical Examination: BP 132/82   Pulse 68   Resp 16   Ht 5\' 8"  (1.727 m)   Wt 217 lb (98.4 kg)   SpO2 98%   BMI 32.99 kg/m   Physical Exam  Constitutional: He is oriented to person, place, and time. He appears well-developed and well-nourished.  Cardiovascular: Normal rate, regular rhythm and normal heart sounds.   Pulmonary/Chest: Effort normal and breath sounds normal.  Musculoskeletal: He exhibits no edema.  Hematoma R medial lower leg, resolving  Neurological: He is alert and oriented to person, place, and time.  Skin: Skin is warm and dry.  Psychiatric: He has a normal mood and affect. His behavior is normal.  Nursing note and vitals reviewed.   Assessment and Plan:  1. Hematoma of leg, right, initial encounter Resolving  2. Benign hypertension Well controlled, refill lisinopril and HCTZ - Comprehensive Metabolic Panel (CMET)  3. Hyperlipidemia, unspecified hyperlipidemia  type On Zocor with unclear indication, cont for now - Lipid Profile  4. Vitamin D deficiency On supplement - Vitamin D (25 hydroxy)  5. Prediabetes - HgB A1c  6. Obesity (BMI 30.0-34.9) Exercise/weight loss discussed - TSH  7. Chronic anemia - CBC  8. Primary osteoarthritis of both knees Consider G/C supplement  Return in about 6 months (around 12/07/2016).  [name]. [facility]  06/08/2016

## 2016-06-09 LAB — CBC
Hematocrit: 41.2 % (ref 37.5–51.0)
Hemoglobin: 13.5 g/dL (ref 13.0–17.7)
MCH: 29.7 pg (ref 26.6–33.0)
MCHC: 32.8 g/dL (ref 31.5–35.7)
MCV: 91 fL (ref 79–97)
Platelets: 267 10*3/uL (ref 150–379)
RBC: 4.55 x10E6/uL (ref 4.14–5.80)
RDW: 14.4 % (ref 12.3–15.4)
WBC: 5.4 10*3/uL (ref 3.4–10.8)

## 2016-06-09 LAB — HEMOGLOBIN A1C
Est. average glucose Bld gHb Est-mCnc: 123 mg/dL
Hgb A1c MFr Bld: 5.9 % — ABNORMAL HIGH (ref 4.8–5.6)

## 2016-06-09 LAB — LIPID PANEL
CHOLESTEROL TOTAL: 202 mg/dL — AB (ref 100–199)
Chol/HDL Ratio: 3.4 ratio units (ref 0.0–5.0)
HDL: 60 mg/dL (ref >39)
LDL CALC: 115 mg/dL — AB (ref 0–99)
TRIGLYCERIDES: 137 mg/dL (ref 0–149)
VLDL CHOLESTEROL CAL: 27 mg/dL (ref 5–40)

## 2016-06-09 LAB — TSH: TSH: 1.7 u[IU]/mL (ref 0.450–4.500)

## 2016-06-09 LAB — COMPREHENSIVE METABOLIC PANEL
A/G RATIO: 1.9 (ref 1.2–2.2)
ALBUMIN: 4.5 g/dL (ref 3.5–4.7)
ALT: 17 IU/L (ref 0–44)
AST: 16 IU/L (ref 0–40)
Alkaline Phosphatase: 110 IU/L (ref 39–117)
BILIRUBIN TOTAL: 1 mg/dL (ref 0.0–1.2)
BUN / CREAT RATIO: 19 (ref 10–24)
BUN: 19 mg/dL (ref 8–27)
CHLORIDE: 98 mmol/L (ref 96–106)
CO2: 25 mmol/L (ref 18–29)
Calcium: 9.3 mg/dL (ref 8.6–10.2)
Creatinine, Ser: 1.02 mg/dL (ref 0.76–1.27)
GFR calc non Af Amer: 69 mL/min/{1.73_m2} (ref >59)
GFR, EST AFRICAN AMERICAN: 80 mL/min/{1.73_m2} (ref >59)
GLUCOSE: 110 mg/dL — AB (ref 65–99)
Globulin, Total: 2.4 g/dL (ref 1.5–4.5)
POTASSIUM: 4.9 mmol/L (ref 3.5–5.2)
Sodium: 140 mmol/L (ref 134–144)
TOTAL PROTEIN: 6.9 g/dL (ref 6.0–8.5)

## 2016-06-09 LAB — VITAMIN D 25 HYDROXY (VIT D DEFICIENCY, FRACTURES): VIT D 25 HYDROXY: 37.7 ng/mL (ref 30.0–100.0)

## 2016-06-11 ENCOUNTER — Other Ambulatory Visit: Payer: Self-pay | Admitting: Family Medicine

## 2016-06-11 MED ORDER — SIMVASTATIN 20 MG PO TABS: 20.0000 mg | ORAL_TABLET | Freq: Every day | ORAL | 3 refills | Status: DC

## 2016-08-28 DIAGNOSIS — R3129 Other microscopic hematuria: Secondary | ICD-10-CM | POA: Diagnosis not present

## 2016-08-28 DIAGNOSIS — Z8546 Personal history of malignant neoplasm of prostate: Secondary | ICD-10-CM | POA: Diagnosis not present

## 2016-10-24 DIAGNOSIS — L570 Actinic keratosis: Secondary | ICD-10-CM | POA: Diagnosis not present

## 2016-10-24 DIAGNOSIS — Z872 Personal history of diseases of the skin and subcutaneous tissue: Secondary | ICD-10-CM | POA: Diagnosis not present

## 2016-10-24 DIAGNOSIS — Z85828 Personal history of other malignant neoplasm of skin: Secondary | ICD-10-CM | POA: Diagnosis not present

## 2016-10-24 DIAGNOSIS — Z859 Personal history of malignant neoplasm, unspecified: Secondary | ICD-10-CM | POA: Diagnosis not present

## 2016-10-24 DIAGNOSIS — Z86018 Personal history of other benign neoplasm: Secondary | ICD-10-CM | POA: Diagnosis not present

## 2016-12-05 ENCOUNTER — Ambulatory Visit (INDEPENDENT_AMBULATORY_CARE_PROVIDER_SITE_OTHER): Payer: PPO | Admitting: Family Medicine

## 2016-12-05 ENCOUNTER — Encounter: Payer: Self-pay | Admitting: Family Medicine

## 2016-12-05 VITALS — BP 120/74 | HR 65 | Temp 98.0°F | Resp 17 | Ht 68.0 in | Wt 221.0 lb

## 2016-12-05 DIAGNOSIS — E559 Vitamin D deficiency, unspecified: Secondary | ICD-10-CM | POA: Diagnosis not present

## 2016-12-05 DIAGNOSIS — E785 Hyperlipidemia, unspecified: Secondary | ICD-10-CM

## 2016-12-05 DIAGNOSIS — I10 Essential (primary) hypertension: Secondary | ICD-10-CM | POA: Diagnosis not present

## 2016-12-05 DIAGNOSIS — S39012A Strain of muscle, fascia and tendon of lower back, initial encounter: Secondary | ICD-10-CM | POA: Diagnosis not present

## 2016-12-05 DIAGNOSIS — E669 Obesity, unspecified: Secondary | ICD-10-CM

## 2016-12-05 DIAGNOSIS — M170 Bilateral primary osteoarthritis of knee: Secondary | ICD-10-CM

## 2016-12-05 DIAGNOSIS — R7303 Prediabetes: Secondary | ICD-10-CM | POA: Diagnosis not present

## 2016-12-05 NOTE — Progress Notes (Signed)
Date:  12/05/2016   Name:  Jonathan [name]   DOB:  1933-11-04   MRN:  [id number]  PCP:  Adline Potter, MD    Chief Complaint: Hypertension   History of Present Illness:  This is a 83 y.o. male seen for six month f/u. Injured L back last week, still bothering. minimal radiation down L leg. OA stable with prn Tylenol. Taking vit D supp. Labs last visit showed stable prediabetes, normal vit D level, resolved anemia. Exercising regularly but not losing weight. Hematoma resolved. Some BLE edema during day but resolves overnight.  Review of Systems:  Review of Systems  Constitutional: Negative for chills and fever.  Respiratory: Negative for cough and shortness of breath.   Cardiovascular: Negative for chest pain and leg swelling.  Gastrointestinal: Negative for abdominal pain.  Endocrine: Negative for polydipsia and polyuria.  Genitourinary: Negative for difficulty urinating.  Neurological: Negative for syncope and light-headedness.    Patient Active Problem List   Diagnosis Date Noted  . Osteoarthritis 06/08/2016  . History of prostate cancer 12/09/2015  . Vitamin D deficiency 12/09/2015  . Gait instability 12/07/2015  . Obesity (BMI 30.0-34.9) 12/07/2015  . Chronic anemia 12/07/2015  . 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 Microscopic hematuria 09/06/2015  . Benign hypertension 04/21/2014  . Hyperlipidemia 04/21/2014  . Prediabetes 04/21/2014  . ED (erectile dysfunction) of organic origin 07/09/2012    Prior to Admission medications   Medication Sig Start Date End Date Taking? Authorizing Provider  acetaminophen (TYLENOL) 500 MG tablet Take 1 tablet (500 mg total) by mouth every 6 (six) hours as needed. 12/09/15  Yes Jaidon Ellery, Gwyndolyn Saxon, MD  Cholecalciferol (VITAMIN D) 2000 units CAPS Take 1 capsule (2,000 Units total) by mouth daily. 12/09/15  Yes Clee Pandit, Gwyndolyn Saxon, MD  hydrochlorothiazide (HYDRODIURIL) 25 MG tablet Take 1 tablet (25 mg total) by mouth daily. 06/08/16  Yes Farren Nelles, Gwyndolyn Saxon, MD   lisinopril (PRINIVIL,ZESTRIL) 20 MG tablet Take 1 tablet (20 mg total) by mouth daily. 06/08/16  Yes Sharda Keddy, Gwyndolyn Saxon, MD    Allergies  Allergen Reactions  . Penicillin V Potassium Other (See Comments)  . Penicillins Rash    Past Surgical History:  Procedure Laterality Date  . PROSTATE SURGERY    . SKIN BIOPSY     basel cell    Social History  Substance Use Topics  . Smoking status: Never Smoker  . Smokeless tobacco: Never Used  . Alcohol use Yes    No family history on file.  Medication list has been reviewed and updated.  Physical Examination: BP 120/74 (BP Location: Right Arm, Patient Position: Sitting, Cuff Size: Normal)   Pulse 65   Temp 98 F (36.7 C) (Oral)   Resp 17   Ht 5\' 8"  (1.727 m)   Wt 221 lb (100.2 kg)   SpO2 98%   BMI 33.60 kg/m   Physical Exam  Constitutional: He appears well-developed and well-nourished.  Cardiovascular: Normal rate, regular rhythm and normal heart sounds.   Pulmonary/Chest: Effort normal and breath sounds normal.  Musculoskeletal:  Trace BLE edema Negative B SLR  Neurological: He is alert.  Skin: Skin is warm and dry.  Psychiatric: He has a normal mood and affect. His behavior is normal.  Nursing note and vitals reviewed.   Assessment and Plan:  1. Benign hypertension Well controlled on lisinopril/HCTZ, consider combo pill next refill  2. Primary osteoarthritis of both knees Well controlled on Tylenol prn  3. Hyperlipidemia, unspecified hyperlipidemia type Recommend d/c as no established CVD  4. Prediabetes Stable,  repeat a1c next visit  5. Obesity (BMI 30.0-34.9) Weight up 4#, exercise/diet discussed, Med diet given  6. Vitamin D deficiency Well controlled on supplement  7. Lumbar strain, initial encounter Tylenol prn, call if sxs worsen/persist  Return in about 6 months (around 06/06/2017).  [name]. [facility]  12/05/2016

## 2016-12-05 NOTE — Patient Instructions (Signed)
Mediterranean Diet A Mediterranean diet refers to food and lifestyle choices that are based on the traditions of countries located on the [place]. This way of eating has been shown to help prevent certain conditions and improve outcomes for people who have chronic diseases, like kidney disease and heart disease. What are tips for following this plan? Lifestyle  Cook and eat meals together with your family, when possible.  Drink enough fluid to keep your urine clear or pale yellow.  Be physically active every day. This includes: ? Aerobic exercise like running or swimming. ? Leisure activities like gardening, walking, or housework.  Get 7-8 hours of sleep each night.  If recommended by your health care provider, drink red wine in moderation. This means 1 glass a day for nonpregnant women and 2 glasses a day for men. A glass of wine equals 5 oz (150 mL). Reading food labels  Check the serving size of packaged foods. For foods such as rice and pasta, the serving size refers to the amount of cooked product, not dry.  Check the total fat in packaged foods. Avoid foods that have saturated fat or trans fats.  Check the ingredients list for added sugars, such as corn syrup. Shopping  At the grocery store, buy most of your food from the areas near the walls of the store. This includes: ? Fresh fruits and vegetables (produce). ? Grains, beans, nuts, and seeds. Some of these may be available in unpackaged forms or large amounts (in bulk). ? Fresh seafood. ? Poultry and eggs. ? Low-fat dairy products.  Buy whole ingredients instead of prepackaged foods.  Buy fresh fruits and vegetables in-season from local farmers markets.  Buy frozen fruits and vegetables in resealable bags.  If you do not have access to quality fresh seafood, buy precooked frozen shrimp or canned fish, such as tuna, salmon, or sardines.  Buy small amounts of raw or cooked vegetables, salads, or olives from the  deli or salad bar at your store.  Stock your pantry so you always have certain foods on hand, such as olive oil, canned tuna, canned tomatoes, rice, pasta, and beans. Cooking  Cook foods with extra-virgin olive oil instead of using butter or other vegetable oils.  Have meat as a side dish, and have vegetables or grains as your main dish. This means having meat in small portions or adding small amounts of meat to foods like pasta or stew.  Use beans or vegetables instead of meat in common dishes like chili or lasagna.  Experiment with different cooking methods. Try roasting or broiling vegetables instead of steaming or sauteing them.  Add frozen vegetables to soups, stews, pasta, or rice.  Add nuts or seeds for added healthy fat at each meal. You can add these to yogurt, salads, or vegetable dishes.  Marinate fish or vegetables using olive oil, lemon juice, garlic, and fresh herbs. Meal planning  Plan to eat 1 vegetarian meal one day each week. Try to work up to 2 vegetarian meals, if possible.  Eat seafood 2 or more times a week.  Have healthy snacks readily available, such as: ? Vegetable sticks with hummus. ? Greek yogurt. ? Fruit and nut trail mix.  Eat balanced meals throughout the week. This includes: ? Fruit: 2-3 servings a day ? Vegetables: 4-5 servings a day ? Low-fat dairy: 2 servings a day ? Fish, poultry, or lean meat: 1 serving a day ? Beans and legumes: 2 or more servings a week ? Nuts   and seeds: 1-2 servings a day ? Whole grains: 6-8 servings a day ? Extra-virgin olive oil: 3-4 servings a day  Limit red meat and sweets to only a few servings a month What are my food choices?  Mediterranean diet ? Recommended ? Grains: Whole-grain pasta. Brown rice. Bulgar wheat. Polenta. Couscous. Whole-wheat bread. Oatmeal. Quinoa. ? Vegetables: Artichokes. Beets. Broccoli. Cabbage. Carrots. Eggplant. Green beans. Chard. Kale. Spinach. Onions. Leeks. Peas. Squash.  Tomatoes. Peppers. Radishes. ? Fruits: Apples. Apricots. Avocado. Berries. Bananas. Cherries. Dates. Figs. Grapes. Lemons. Melon. Oranges. Peaches. Plums. Pomegranate. ? Meats and other protein foods: Beans. Almonds. Sunflower seeds. Pine nuts. Peanuts. Cod. Salmon. Scallops. Shrimp. Tuna. Tilapia. Clams. Oysters. Eggs. ? Dairy: Low-fat milk. Cheese. Greek yogurt. ? Beverages: Water. Red wine. Herbal tea. ? Fats and oils: Extra virgin olive oil. Avocado oil. Grape seed oil. ? Sweets and desserts: Greek yogurt with honey. Baked apples. Poached pears. Trail mix. ? Seasoning and other foods: Basil. Cilantro. Coriander. Cumin. Mint. Parsley. Sage. Rosemary. Tarragon. Garlic. Oregano. Thyme. Pepper. Balsalmic vinegar. Tahini. Hummus. Tomato sauce. Olives. Mushrooms. ? Limit these ? Grains: Prepackaged pasta or rice dishes. Prepackaged cereal with added sugar. ? Vegetables: Deep fried potatoes (french fries). ? Fruits: Fruit canned in syrup. ? Meats and other protein foods: Beef. Pork. Lamb. Poultry with skin. Hot dogs. Bacon. ? Dairy: Ice cream. Sour cream. Whole milk. ? Beverages: Juice. Sugar-sweetened soft drinks. Beer. Liquor and spirits. ? Fats and oils: Butter. Canola oil. Vegetable oil. Beef fat (tallow). Lard. ? Sweets and desserts: Cookies. Cakes. Pies. Candy. ? Seasoning and other foods: Mayonnaise. Premade sauces and marinades. ? The items listed may not be a complete list. Talk with your dietitian about what dietary choices are right for you. Summary  The Mediterranean diet includes both food and lifestyle choices.  Eat a variety of fresh fruits and vegetables, beans, nuts, seeds, and whole grains.  Limit the amount of red meat and sweets that you eat.  Talk with your health care provider about whether it is safe for you to drink red wine in moderation. This means 1 glass a day for nonpregnant women and 2 glasses a day for men. A glass of wine equals 5 oz (150 mL). This information  is not intended to replace advice given to you by your health care provider. Make sure you discuss any questions you have with your health care provider. Document Released: 02/02/2016 Document Revised: 03/06/2016 Document Reviewed: 02/02/2016 Elsevier Interactive Patient Education  2018 Elsevier Inc.  

## 2017-04-15 DIAGNOSIS — L821 Other seborrheic keratosis: Secondary | ICD-10-CM | POA: Diagnosis not present

## 2017-04-15 DIAGNOSIS — L578 Other skin changes due to chronic exposure to nonionizing radiation: Secondary | ICD-10-CM | POA: Diagnosis not present

## 2017-04-15 DIAGNOSIS — Z859 Personal history of malignant neoplasm, unspecified: Secondary | ICD-10-CM | POA: Diagnosis not present

## 2017-04-15 DIAGNOSIS — Z86018 Personal history of other benign neoplasm: Secondary | ICD-10-CM | POA: Diagnosis not present

## 2017-04-15 DIAGNOSIS — D485 Neoplasm of uncertain behavior of skin: Secondary | ICD-10-CM | POA: Diagnosis not present

## 2017-04-15 DIAGNOSIS — L570 Actinic keratosis: Secondary | ICD-10-CM | POA: Diagnosis not present

## 2017-04-15 DIAGNOSIS — Z85828 Personal history of other malignant neoplasm of skin: Secondary | ICD-10-CM | POA: Diagnosis not present

## 2017-04-22 DIAGNOSIS — D485 Neoplasm of uncertain behavior of skin: Secondary | ICD-10-CM | POA: Diagnosis not present

## 2017-07-01 ENCOUNTER — Ambulatory Visit (INDEPENDENT_AMBULATORY_CARE_PROVIDER_SITE_OTHER): Payer: PPO | Admitting: Family Medicine

## 2017-07-01 ENCOUNTER — Encounter: Payer: Self-pay | Admitting: Family Medicine

## 2017-07-01 VITALS — BP 122/70 | HR 76 | Ht 68.0 in | Wt 213.0 lb

## 2017-07-01 DIAGNOSIS — I10 Essential (primary) hypertension: Secondary | ICD-10-CM

## 2017-07-01 DIAGNOSIS — I83891 Varicose veins of right lower extremities with other complications: Secondary | ICD-10-CM | POA: Diagnosis not present

## 2017-07-01 DIAGNOSIS — Z8546 Personal history of malignant neoplasm of prostate: Secondary | ICD-10-CM

## 2017-07-01 DIAGNOSIS — M170 Bilateral primary osteoarthritis of knee: Secondary | ICD-10-CM | POA: Diagnosis not present

## 2017-07-01 DIAGNOSIS — R2681 Unsteadiness on feet: Secondary | ICD-10-CM

## 2017-07-01 DIAGNOSIS — E669 Obesity, unspecified: Secondary | ICD-10-CM | POA: Diagnosis not present

## 2017-07-01 DIAGNOSIS — R7303 Prediabetes: Secondary | ICD-10-CM | POA: Diagnosis not present

## 2017-07-01 DIAGNOSIS — E785 Hyperlipidemia, unspecified: Secondary | ICD-10-CM

## 2017-07-01 DIAGNOSIS — E559 Vitamin D deficiency, unspecified: Secondary | ICD-10-CM

## 2017-07-01 DIAGNOSIS — I83893 Varicose veins of bilateral lower extremities with other complications: Secondary | ICD-10-CM | POA: Insufficient documentation

## 2017-07-01 MED ORDER — LISINOPRIL-HYDROCHLOROTHIAZIDE 20-25 MG PO TABS: 1.0000 | ORAL_TABLET | Freq: Every day | ORAL | 3 refills | Status: DC

## 2017-07-01 NOTE — Progress Notes (Signed)
Date:  07/01/2017   Name:  Jonathan [name]   DOB:  02/27/1934   MRN:  [id number]  PCP:  Adline Potter, MD    Chief Complaint: Follow-up (6 months follow up. Started taking glucaosamine for knees. Told my trainer to take everyday. )   History of Present Illness:  This is a 84 y.o. male seen for six month f/u. Exercising at gym 3 days/week, started G/C which seems to be helping knees. Wants lipids checked off statin. Lumbar strain resolved. Has intermittent BLE edema which improves overnight. Concerned about varicose veins R leg, has had treated in past.  Review of Systems:  Review of Systems  Constitutional: Negative for chills and fever.  Respiratory: Negative for cough and shortness of breath.   Cardiovascular: Negative for chest pain.  Endocrine: Negative for polydipsia and polyuria.  Genitourinary: Negative for difficulty urinating.  Neurological: Negative for syncope and light-headedness.    Patient Active Problem List   Diagnosis Date Noted  . Osteoarthritis 06/08/2016  . History of prostate cancer 12/09/2015  . Vitamin D deficiency 12/09/2015  . Gait instability 12/07/2015  . Obesity (BMI 30.0-34.9) 12/07/2015  . 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 Microscopic hematuria 09/06/2015  . Benign hypertension 04/21/2014  . Hyperlipidemia 04/21/2014  . Prediabetes 04/21/2014  . ED (erectile dysfunction) of organic origin 07/09/2012    Prior to Admission medications   Medication Sig Start Date End Date Taking? Authorizing Provider  acetaminophen (TYLENOL) 500 MG tablet Take 1 tablet (500 mg total) by mouth every 6 (six) hours as needed. 12/09/15  Yes Elden Brucato, Gwyndolyn Saxon, MD  Cholecalciferol (VITAMIN D) 2000 units CAPS Take 1 capsule (2,000 Units total) by mouth daily. 12/09/15  Yes Georga Stys, Gwyndolyn Saxon, MD  glucosamine-chondroitin 500-400 MG tablet Take 1 tablet by mouth 2 (two) times daily.   Yes [provider]  lisinopril-hydrochlorothiazide (PRINZIDE,ZESTORETIC) 20-25 MG tablet Take 1  tablet by mouth daily. 07/01/17   Adline Potter, MD    Allergies  Allergen Reactions  . Penicillin V Potassium Other (See Comments)  . Penicillins Rash    Past Surgical History:  Procedure Laterality Date  . PROSTATE SURGERY    . SKIN BIOPSY     basel cell    Social History   Tobacco Use  . Smoking status: Never Smoker  . Smokeless tobacco: Never Used  Substance Use Topics  . Alcohol use: Yes  . Drug use: Not on file    History reviewed. No pertinent family history.  Medication list has been reviewed and updated.  Physical Examination: BP 122/70   Pulse 76   Ht 5\' 8"  (1.727 m)   Wt 213 lb (96.6 kg)   BMI 32.39 kg/m   Physical Exam  Constitutional: He appears well-developed and well-nourished.  Cardiovascular: Normal rate, regular rhythm and normal heart sounds.  Superficial varicosities R medial knee  Pulmonary/Chest: Effort normal and breath sounds normal.  Musculoskeletal:  Trace BLE edema  Neurological: He is alert.  Skin: Skin is warm and dry.  Psychiatric: He has a normal mood and affect. His behavior is normal.  Nursing note and vitals reviewed.   Assessment and Plan:  1. Benign hypertension Well controlled, change to Prinzide - Comprehensive Metabolic Panel (CMET) - CBC  2. Prediabetes Exercise/weight loss discussed - HgB A1c  3. Primary osteoarthritis of both knees Improved on G/C, cont Tylenol prn  4. Hyperlipidemia, unspecified hyperlipidemia type Requests recheck off statin, no established CVD - Lipid Profile  5. Varicose veins of leg with edema, right Reassured likely  benign but pt to see vein center for eval  6. Gait instability - B12  7. Obesity (BMI 30.0-34.9) Weight down 8#, cont exercise/weight loss  8. Vitamin D deficiency On supplement - Vitamin D (25 hydroxy)  9. History of prostate cancer Followed yearly by urology  Return in about 1 year (around 07/01/2018).  [name]. [facility]  07/01/2017

## 2017-07-02 ENCOUNTER — Other Ambulatory Visit: Payer: Self-pay | Admitting: Family Medicine

## 2017-07-02 ENCOUNTER — Encounter: Payer: Self-pay | Admitting: Family Medicine

## 2017-07-02 DIAGNOSIS — E538 Deficiency of other specified B group vitamins: Secondary | ICD-10-CM | POA: Insufficient documentation

## 2017-07-02 LAB — CBC
HEMATOCRIT: 43.8 % (ref 37.5–51.0)
Hemoglobin: 14.4 g/dL (ref 13.0–17.7)
MCH: 31 pg (ref 26.6–33.0)
MCHC: 32.9 g/dL (ref 31.5–35.7)
MCV: 94 fL (ref 79–97)
Platelets: 280 10*3/uL (ref 150–379)
RBC: 4.65 x10E6/uL (ref 4.14–5.80)
RDW: 14.2 % (ref 12.3–15.4)
WBC: 5.8 10*3/uL (ref 3.4–10.8)

## 2017-07-02 LAB — COMPREHENSIVE METABOLIC PANEL
ALBUMIN: 4.7 g/dL (ref 3.5–4.7)
ALT: 15 IU/L (ref 0–44)
AST: 15 IU/L (ref 0–40)
Albumin/Globulin Ratio: 2 (ref 1.2–2.2)
Alkaline Phosphatase: 103 IU/L (ref 39–117)
BUN / CREAT RATIO: 12 (ref 10–24)
BUN: 12 mg/dL (ref 8–27)
Bilirubin Total: 0.7 mg/dL (ref 0.0–1.2)
CHLORIDE: 99 mmol/L (ref 96–106)
CO2: 26 mmol/L (ref 20–29)
Calcium: 9.4 mg/dL (ref 8.6–10.2)
Creatinine, Ser: 1.04 mg/dL (ref 0.76–1.27)
GFR calc non Af Amer: 67 mL/min/{1.73_m2} (ref >59)
GFR, EST AFRICAN AMERICAN: 77 mL/min/{1.73_m2} (ref >59)
GLOBULIN, TOTAL: 2.4 g/dL (ref 1.5–4.5)
GLUCOSE: 97 mg/dL (ref 65–99)
Potassium: 5 mmol/L (ref 3.5–5.2)
Sodium: 141 mmol/L (ref 134–144)
TOTAL PROTEIN: 7.1 g/dL (ref 6.0–8.5)

## 2017-07-02 LAB — LIPID PANEL
CHOLESTEROL TOTAL: 250 mg/dL — AB (ref 100–199)
Chol/HDL Ratio: 5 ratio (ref 0.0–5.0)
HDL: 50 mg/dL (ref >39)
LDL Calculated: 166 mg/dL — ABNORMAL HIGH (ref 0–99)
Triglycerides: 168 mg/dL — ABNORMAL HIGH (ref 0–149)
VLDL Cholesterol Cal: 34 mg/dL (ref 5–40)

## 2017-07-02 LAB — VITAMIN B12: VITAMIN B 12: 236 pg/mL (ref 232–1245)

## 2017-07-02 LAB — HEMOGLOBIN A1C
Est. average glucose Bld gHb Est-mCnc: 120 mg/dL
Hgb A1c MFr Bld: 5.8 % — ABNORMAL HIGH (ref 4.8–5.6)

## 2017-07-02 LAB — VITAMIN D 25 HYDROXY (VIT D DEFICIENCY, FRACTURES): VIT D 25 HYDROXY: 35.5 ng/mL (ref 30.0–100.0)

## 2017-07-02 MED ORDER — B-12 500 MCG PO TABS: 1.0000 | ORAL_TABLET | Freq: Every day | ORAL | Status: AC

## 2017-09-17 DIAGNOSIS — H26491 Other secondary cataract, right eye: Secondary | ICD-10-CM | POA: Diagnosis not present

## 2017-10-30 DIAGNOSIS — Z86018 Personal history of other benign neoplasm: Secondary | ICD-10-CM | POA: Diagnosis not present

## 2017-10-30 DIAGNOSIS — L821 Other seborrheic keratosis: Secondary | ICD-10-CM | POA: Diagnosis not present

## 2017-10-30 DIAGNOSIS — Z872 Personal history of diseases of the skin and subcutaneous tissue: Secondary | ICD-10-CM | POA: Diagnosis not present

## 2017-10-30 DIAGNOSIS — L570 Actinic keratosis: Secondary | ICD-10-CM | POA: Diagnosis not present

## 2017-10-30 DIAGNOSIS — Z85828 Personal history of other malignant neoplasm of skin: Secondary | ICD-10-CM | POA: Diagnosis not present

## 2017-10-30 DIAGNOSIS — Z859 Personal history of malignant neoplasm, unspecified: Secondary | ICD-10-CM | POA: Diagnosis not present

## 2017-10-30 DIAGNOSIS — L578 Other skin changes due to chronic exposure to nonionizing radiation: Secondary | ICD-10-CM | POA: Diagnosis not present

## 2017-11-25 ENCOUNTER — Ambulatory Visit: Payer: PPO | Admitting: Urology

## 2017-11-25 ENCOUNTER — Encounter: Payer: Self-pay | Admitting: Urology

## 2017-11-25 VITALS — BP 163/75 | HR 80 | Ht 68.0 in | Wt 220.0 lb

## 2017-11-25 DIAGNOSIS — Z8546 Personal history of malignant neoplasm of prostate: Secondary | ICD-10-CM | POA: Diagnosis not present

## 2017-11-25 DIAGNOSIS — R3129 Other microscopic hematuria: Secondary | ICD-10-CM | POA: Diagnosis not present

## 2017-11-25 LAB — URINALYSIS, COMPLETE
BILIRUBIN UA: NEGATIVE
GLUCOSE, UA: NEGATIVE
Ketones, UA: NEGATIVE
LEUKOCYTES UA: NEGATIVE
Nitrite, UA: NEGATIVE
Protein, UA: NEGATIVE
Specific Gravity, UA: 1.015 (ref 1.005–1.030)
UUROB: 0.2 mg/dL (ref 0.2–1.0)
pH, UA: 5 (ref 5.0–7.5)

## 2017-11-25 LAB — MICROSCOPIC EXAMINATION
BACTERIA UA: NONE SEEN
Epithelial Cells (non renal): NONE SEEN /hpf (ref 0–10)
WBC UA: NONE SEEN /HPF (ref 0–5)

## 2017-11-25 NOTE — Progress Notes (Signed)
11/25/2017 2:51 PM   Demaris Callander January 07, 1934 [id number]  Referring provider: Adline Potter, MD No address on file  Chief Complaint  Patient presents with  . Follow-up  . Prostate Cancer   Urologic problem list: - T1c adenocarcinoma the prostate, low risk; (diagnosed May 2007, treated with radiation-IMRT, PSA at time of diagnosis was 6.5  - Microhematuria; found to have a bulb stricture and underwent dilation.  84 year old male presents for   HPI: Annual follow-up.  I last saw him at [facility] in March 2018.  PSA at that visit was stable at 0.28.  He has no bothersome lower urinary tract symptoms.  Denies dysuria or gross hematuria.  Denies flank, abdominal, pelvic or scrotal pain.   PMH: Past Medical History:  Diagnosis Date  . Cancer [facility])    prostate  . Hypercholesteremia   . Hypertension   . Pre-diabetes     Surgical History: Past Surgical History:  Procedure Laterality Date  . PROSTATE SURGERY    . SKIN BIOPSY     basel cell    Home Medications:  Allergies as of 11/25/2017      Reactions   Penicillin V Potassium Other (See Comments)   Penicillins Rash      Medication List        Accurate as of 11/25/17  2:51 PM. Always use your most recent med list.          acetaminophen 500 MG tablet Commonly known as:  TYLENOL Take 1 tablet (500 mg total) by mouth every 6 (six) hours as needed.   aspirin EC 81 MG tablet Take by mouth.   B-12 500 MCG Tabs Take 1 tablet by mouth daily.   glucosamine-chondroitin 500-400 MG tablet Take 1 tablet by mouth 2 (two) times daily.   lisinopril-hydrochlorothiazide 20-25 MG tablet Commonly known as:  PRINZIDE,ZESTORETIC Take 1 tablet by mouth daily.   Vitamin D 2000 units Caps Take 1 capsule (2,000 Units total) by mouth daily.       Allergies:  Allergies  Allergen Reactions  . Penicillin V Potassium Other (See Comments)  . Penicillins Rash    Family History: History reviewed. No pertinent family  history.  Social History:  reports that he has never smoked. He has never used smokeless tobacco. He reports that he drinks alcohol. He reports that he does not use drugs.  ROS: UROLOGY Frequent Urination?: No Hard to postpone urination?: No Burning/pain with urination?: No Get up at night to urinate?: No Leakage of urine?: No Urine stream starts and stops?: No Trouble starting stream?: No Do you have to strain to urinate?: No Blood in urine?: No Urinary tract infection?: No Sexually transmitted disease?: No Injury to kidneys or bladder?: No Painful intercourse?: No Weak stream?: No Erection problems?: No Penile pain?: No  Gastrointestinal Nausea?: No Vomiting?: No Indigestion/heartburn?: No Diarrhea?: No Constipation?: No  Constitutional Fever: No Night sweats?: No Weight loss?: No Fatigue?: No  Skin Skin rash/lesions?: No Itching?: No  Eyes Blurred vision?: No Double vision?: No  Ears/Nose/Throat Sore throat?: No Sinus problems?: No  Hematologic/Lymphatic Swollen glands?: No Easy bruising?: No  Cardiovascular Leg swelling?: No Chest pain?: No  Respiratory Cough?: No Shortness of breath?: No  Endocrine Excessive thirst?: No  Musculoskeletal Back pain?: No Joint pain?: No  Neurological Headaches?: No Dizziness?: No  Psychologic Depression?: No Anxiety?: No  Physical Exam: BP (!) 163/75 (BP Location: Left Arm, Patient Position: Sitting, Cuff Size: Large)   Pulse 80   Ht 5\' 8"  (1.727  m)   Wt 220 lb (99.8 kg)   SpO2 99%   BMI 33.45 kg/m   Constitutional:  Alert and oriented, No acute distress. HEENT: [place] AT, moist mucus membranes.  Trachea midline, no masses. Cardiovascular: No clubbing, cyanosis, or edema. Respiratory: Normal respiratory effort, no increased work of breathing. GI: Abdomen is soft, nontender, nondistended, no abdominal masses GU: No CVA tenderness Lymph: No cervical or inguinal lymphadenopathy. Skin: No rashes,  bruises or suspicious lesions. Neurologic: Grossly intact, no focal deficits, moving all 4 extremities. Psychiatric: Normal mood and affect.  Laboratory Data:  Urinalysis Dipstick 1+ blood; microscopy negative   Assessment & Plan:   84 year old male with history of T1c low risk prostate cancer status post radiation therapy.  PSA was ordered and drawn today.  If stable he will continue annual follow-up.  Urinalysis today was clear.  Return in about 1 year (around 11/26/2018) for Recheck, PSA.   Abbie Sons, [place] [address], [place], [place] [address] [phone]

## 2017-11-26 ENCOUNTER — Telehealth: Payer: Self-pay

## 2017-11-26 LAB — PSA: Prostate Specific Ag, Serum: 0.2 ng/mL (ref 0.0–4.0)

## 2017-11-26 NOTE — Telephone Encounter (Signed)
Pt informed

## 2017-11-26 NOTE — Telephone Encounter (Signed)
-----   Message from Abbie Sons, MD sent at 11/26/2017 12:37 PM EDT ----- PSA stable at 0.2

## 2017-12-30 ENCOUNTER — Ambulatory Visit: Payer: PPO | Admitting: Family Medicine

## 2018-02-04 DIAGNOSIS — D485 Neoplasm of uncertain behavior of skin: Secondary | ICD-10-CM | POA: Diagnosis not present

## 2018-02-04 DIAGNOSIS — C44321 Squamous cell carcinoma of skin of nose: Secondary | ICD-10-CM | POA: Diagnosis not present

## 2018-02-05 DIAGNOSIS — E7800 Pure hypercholesterolemia, unspecified: Secondary | ICD-10-CM | POA: Diagnosis not present

## 2018-02-05 DIAGNOSIS — E669 Obesity, unspecified: Secondary | ICD-10-CM | POA: Diagnosis not present

## 2018-02-05 DIAGNOSIS — Z8546 Personal history of malignant neoplasm of prostate: Secondary | ICD-10-CM | POA: Diagnosis not present

## 2018-02-05 DIAGNOSIS — I10 Essential (primary) hypertension: Secondary | ICD-10-CM | POA: Diagnosis not present

## 2018-02-05 DIAGNOSIS — R7302 Impaired glucose tolerance (oral): Secondary | ICD-10-CM | POA: Diagnosis not present

## 2018-03-12 DIAGNOSIS — C44321 Squamous cell carcinoma of skin of nose: Secondary | ICD-10-CM | POA: Diagnosis not present

## 2018-05-05 DIAGNOSIS — L578 Other skin changes due to chronic exposure to nonionizing radiation: Secondary | ICD-10-CM | POA: Diagnosis not present

## 2018-05-05 DIAGNOSIS — Z859 Personal history of malignant neoplasm, unspecified: Secondary | ICD-10-CM | POA: Diagnosis not present

## 2018-05-05 DIAGNOSIS — L570 Actinic keratosis: Secondary | ICD-10-CM | POA: Diagnosis not present

## 2018-05-05 DIAGNOSIS — Z872 Personal history of diseases of the skin and subcutaneous tissue: Secondary | ICD-10-CM | POA: Diagnosis not present

## 2018-05-05 DIAGNOSIS — C4441 Basal cell carcinoma of skin of scalp and neck: Secondary | ICD-10-CM | POA: Diagnosis not present

## 2018-05-05 DIAGNOSIS — Z85828 Personal history of other malignant neoplasm of skin: Secondary | ICD-10-CM | POA: Diagnosis not present

## 2018-05-05 DIAGNOSIS — Z1283 Encounter for screening for malignant neoplasm of skin: Secondary | ICD-10-CM | POA: Diagnosis not present

## 2018-05-05 DIAGNOSIS — D485 Neoplasm of uncertain behavior of skin: Secondary | ICD-10-CM | POA: Diagnosis not present

## 2018-05-05 DIAGNOSIS — Z86018 Personal history of other benign neoplasm: Secondary | ICD-10-CM | POA: Diagnosis not present

## 2018-06-04 DIAGNOSIS — C4441 Basal cell carcinoma of skin of scalp and neck: Secondary | ICD-10-CM | POA: Diagnosis not present

## 2018-06-04 DIAGNOSIS — C4491 Basal cell carcinoma of skin, unspecified: Secondary | ICD-10-CM | POA: Diagnosis not present

## 2018-08-08 DIAGNOSIS — Z0000 Encounter for general adult medical examination without abnormal findings: Secondary | ICD-10-CM | POA: Diagnosis not present

## 2018-08-08 DIAGNOSIS — I10 Essential (primary) hypertension: Secondary | ICD-10-CM | POA: Diagnosis not present

## 2018-08-08 DIAGNOSIS — E119 Type 2 diabetes mellitus without complications: Secondary | ICD-10-CM | POA: Diagnosis not present

## 2018-08-08 DIAGNOSIS — E7800 Pure hypercholesterolemia, unspecified: Secondary | ICD-10-CM | POA: Diagnosis not present

## 2018-08-08 DIAGNOSIS — E669 Obesity, unspecified: Secondary | ICD-10-CM | POA: Diagnosis not present

## 2018-11-04 DIAGNOSIS — Z872 Personal history of diseases of the skin and subcutaneous tissue: Secondary | ICD-10-CM | POA: Diagnosis not present

## 2018-11-04 DIAGNOSIS — Z86018 Personal history of other benign neoplasm: Secondary | ICD-10-CM | POA: Diagnosis not present

## 2018-11-04 DIAGNOSIS — Z85828 Personal history of other malignant neoplasm of skin: Secondary | ICD-10-CM | POA: Diagnosis not present

## 2018-11-04 DIAGNOSIS — L578 Other skin changes due to chronic exposure to nonionizing radiation: Secondary | ICD-10-CM | POA: Diagnosis not present

## 2018-11-04 DIAGNOSIS — Z859 Personal history of malignant neoplasm, unspecified: Secondary | ICD-10-CM | POA: Diagnosis not present

## 2018-11-04 DIAGNOSIS — L570 Actinic keratosis: Secondary | ICD-10-CM | POA: Diagnosis not present

## 2018-12-01 ENCOUNTER — Ambulatory Visit: Payer: PPO | Admitting: Urology

## 2019-01-19 ENCOUNTER — Other Ambulatory Visit: Payer: PPO

## 2019-01-19 ENCOUNTER — Other Ambulatory Visit: Payer: Self-pay

## 2019-01-19 DIAGNOSIS — Z8546 Personal history of malignant neoplasm of prostate: Secondary | ICD-10-CM | POA: Diagnosis not present

## 2019-01-20 LAB — PSA: Prostate Specific Ag, Serum: 0.3 ng/mL (ref 0.0–4.0)

## 2019-01-22 ENCOUNTER — Other Ambulatory Visit: Payer: Self-pay

## 2019-01-22 ENCOUNTER — Ambulatory Visit: Payer: PPO | Admitting: Urology

## 2019-01-22 ENCOUNTER — Encounter: Payer: Self-pay | Admitting: Urology

## 2019-01-22 VITALS — BP 132/71 | HR 76 | Ht 68.0 in | Wt 200.6 lb

## 2019-01-22 DIAGNOSIS — R350 Frequency of micturition: Secondary | ICD-10-CM | POA: Diagnosis not present

## 2019-01-22 DIAGNOSIS — Z8546 Personal history of malignant neoplasm of prostate: Secondary | ICD-10-CM | POA: Diagnosis not present

## 2019-01-22 MED ORDER — TAMSULOSIN HCL 0.4 MG PO CAPS: 0.4000 mg | ORAL_CAPSULE | Freq: Every day | ORAL | 0 refills | Status: DC

## 2019-01-22 NOTE — Progress Notes (Signed)
01/22/2019 2:21 PM   Jonathan [name] 1933-09-06 [id number]  Referring provider: Sofie Hartigan, MD [name],  [place] [address]  Chief Complaint  Patient presents with  . Prostate Cancer    Urologic history: 1. T1c adenocarcinoma the prostate   -low risk; dx May 2007, tx radiation-IMRT, PSA 6.5  2. Microhematuria   -found to have a bulb stricture and underwent dilation.     HPI: 85 year old male presents for annual follow-up.  Since his visit last year he has had mild increased urinary frequency.  He voids with a good stream and denies hesitancy or decreased force and caliber of his urinary stream.  PSA performed earlier this week was stable at 0.3.  Denies dysuria or gross hematuria.  He has no flank, abdominal, pelvic or scrotal pain.  PMH: Past Medical History:  Diagnosis Date  . Cancer [facility])    prostate  . Hypercholesteremia   . Hypertension   . Pre-diabetes     Surgical History: Past Surgical History:  Procedure Laterality Date  . PROSTATE SURGERY    . SKIN BIOPSY     basel cell    Home Medications:  Allergies as of 01/22/2019      Reactions   Penicillin V Potassium Other (See Comments)   Penicillins Rash      Medication List       Accurate as of January 22, 2019  2:21 PM. If you have any questions, ask your nurse or doctor.        STOP taking these medications   aspirin EC 81 MG tablet Stopped by: Abbie Sons, MD   glucosamine-chondroitin 500-400 MG tablet Stopped by: Abbie Sons, MD     TAKE these medications   acetaminophen 500 MG tablet Commonly known as: TYLENOL Take 1 tablet (500 mg total) by mouth every 6 (six) hours as needed.   B-12 500 MCG Tabs Take 1 tablet by mouth daily.   lisinopril-hydrochlorothiazide 20-25 MG tablet Commonly known as: ZESTORETIC Take 1 tablet by mouth daily.   simvastatin 10 MG tablet Commonly known as: ZOCOR Take 10 mg by mouth daily.   Vitamin D 50 MCG (2000 UT) Caps Take 1  capsule (2,000 Units total) by mouth daily.       Allergies:  Allergies  Allergen Reactions  . Penicillin V Potassium Other (See Comments)  . Penicillins Rash    Family History: No family history on file.  Social History:  reports that he has never smoked. He has never used smokeless tobacco. He reports current alcohol use. He reports that he does not use drugs.  ROS: UROLOGY Frequent Urination?: No Hard to postpone urination?: No Burning/pain with urination?: No Get up at night to urinate?: No Leakage of urine?: No Urine stream starts and stops?: No Trouble starting stream?: No Do you have to strain to urinate?: No Blood in urine?: No Urinary tract infection?: No Sexually transmitted disease?: No Injury to kidneys or bladder?: No Painful intercourse?: No Weak stream?: No Erection problems?: No Penile pain?: No  Gastrointestinal Nausea?: No Vomiting?: No Indigestion/heartburn?: No Diarrhea?: No Constipation?: No  Constitutional Fever: No Night sweats?: No Weight loss?: No Fatigue?: No  Skin Skin rash/lesions?: No Itching?: No  Eyes Blurred vision?: No Double vision?: No  Ears/Nose/Throat Sore throat?: No Sinus problems?: No  Hematologic/Lymphatic Swollen glands?: No Easy bruising?: No  Cardiovascular Leg swelling?: No Chest pain?: No  Respiratory Cough?: No Shortness of breath?: No  Endocrine Excessive thirst?: No  Musculoskeletal Back pain?: No Joint pain?: No  Neurological Headaches?: No Dizziness?: No  Psychologic Depression?: No Anxiety?: No  Physical Exam: BP 132/71 (BP Location: Left Arm, Patient Position: Sitting, Cuff Size: Normal)   Pulse 76   Ht 5\' 8"  (1.727 m)   Wt 200 lb 9.6 oz (91 kg)   BMI 30.50 kg/m   Constitutional:  Alert and oriented, No acute distress. HEENT: [place] AT, moist mucus membranes.  Trachea midline, no masses. Cardiovascular: No clubbing, cyanosis, or edema. Respiratory: Normal respiratory  effort, no increased work of breathing. Skin: No rashes, bruises or suspicious lesions. Neurologic: Grossly intact, no focal deficits, moving all 4 extremities. Psychiatric: Normal mood and affect.   Assessment & Plan:    - History of prostate cancer status post radiation PSA remains low and stable.  - Urinary frequency He states his voiding symptoms are bothersome enough that he would take medication.  He was having similar symptoms during his radiation that was improved with tamsulosin.  Rx was sent to pharmacy.  He will call back if medication not effective or if symptoms worsen.  Continue annual follow-up.   Abbie Sons, [place] [address] [place], [place] [address] [phone]

## 2019-02-06 DIAGNOSIS — E7800 Pure hypercholesterolemia, unspecified: Secondary | ICD-10-CM | POA: Diagnosis not present

## 2019-02-06 DIAGNOSIS — E669 Obesity, unspecified: Secondary | ICD-10-CM | POA: Diagnosis not present

## 2019-02-06 DIAGNOSIS — E119 Type 2 diabetes mellitus without complications: Secondary | ICD-10-CM | POA: Diagnosis not present

## 2019-02-06 DIAGNOSIS — H6121 Impacted cerumen, right ear: Secondary | ICD-10-CM | POA: Diagnosis not present

## 2019-02-06 DIAGNOSIS — I10 Essential (primary) hypertension: Secondary | ICD-10-CM | POA: Diagnosis not present

## 2019-02-06 DIAGNOSIS — H60501 Unspecified acute noninfective otitis externa, right ear: Secondary | ICD-10-CM | POA: Diagnosis not present

## 2019-02-16 ENCOUNTER — Other Ambulatory Visit: Payer: Self-pay | Admitting: *Deleted

## 2019-02-16 MED ORDER — TAMSULOSIN HCL 0.4 MG PO CAPS: 0.4000 mg | ORAL_CAPSULE | Freq: Every day | ORAL | 0 refills | Status: DC

## 2019-03-19 DIAGNOSIS — H26491 Other secondary cataract, right eye: Secondary | ICD-10-CM | POA: Diagnosis not present

## 2019-05-11 ENCOUNTER — Other Ambulatory Visit: Payer: Self-pay | Admitting: Urology

## 2019-08-09 ENCOUNTER — Other Ambulatory Visit: Payer: Self-pay | Admitting: Urology

## 2019-08-14 DIAGNOSIS — E7800 Pure hypercholesterolemia, unspecified: Secondary | ICD-10-CM | POA: Diagnosis not present

## 2019-08-14 DIAGNOSIS — Z0000 Encounter for general adult medical examination without abnormal findings: Secondary | ICD-10-CM | POA: Diagnosis not present

## 2019-08-14 DIAGNOSIS — I10 Essential (primary) hypertension: Secondary | ICD-10-CM | POA: Diagnosis not present

## 2019-08-14 DIAGNOSIS — E119 Type 2 diabetes mellitus without complications: Secondary | ICD-10-CM | POA: Diagnosis not present

## 2019-08-14 DIAGNOSIS — E669 Obesity, unspecified: Secondary | ICD-10-CM | POA: Diagnosis not present

## 2019-11-03 ENCOUNTER — Other Ambulatory Visit: Payer: Self-pay | Admitting: Urology

## 2019-11-04 DIAGNOSIS — L578 Other skin changes due to chronic exposure to nonionizing radiation: Secondary | ICD-10-CM | POA: Diagnosis not present

## 2019-11-04 DIAGNOSIS — Z859 Personal history of malignant neoplasm, unspecified: Secondary | ICD-10-CM | POA: Diagnosis not present

## 2019-11-04 DIAGNOSIS — Z872 Personal history of diseases of the skin and subcutaneous tissue: Secondary | ICD-10-CM | POA: Diagnosis not present

## 2019-11-04 DIAGNOSIS — L570 Actinic keratosis: Secondary | ICD-10-CM | POA: Diagnosis not present

## 2019-11-04 DIAGNOSIS — Z86018 Personal history of other benign neoplasm: Secondary | ICD-10-CM | POA: Diagnosis not present

## 2019-11-04 DIAGNOSIS — L821 Other seborrheic keratosis: Secondary | ICD-10-CM | POA: Diagnosis not present

## 2019-11-04 DIAGNOSIS — Z85828 Personal history of other malignant neoplasm of skin: Secondary | ICD-10-CM | POA: Diagnosis not present

## 2020-01-11 ENCOUNTER — Other Ambulatory Visit: Payer: Self-pay | Admitting: Urology

## 2020-01-27 ENCOUNTER — Other Ambulatory Visit: Payer: Self-pay

## 2020-01-27 ENCOUNTER — Other Ambulatory Visit: Payer: PPO

## 2020-01-27 DIAGNOSIS — Z8546 Personal history of malignant neoplasm of prostate: Secondary | ICD-10-CM | POA: Diagnosis not present

## 2020-01-28 LAB — PSA: Prostate Specific Ag, Serum: 0.7 ng/mL (ref 0.0–4.0)

## 2020-01-29 ENCOUNTER — Other Ambulatory Visit: Payer: Self-pay

## 2020-01-29 ENCOUNTER — Ambulatory Visit (INDEPENDENT_AMBULATORY_CARE_PROVIDER_SITE_OTHER): Payer: PPO | Admitting: Urology

## 2020-01-29 ENCOUNTER — Encounter: Payer: Self-pay | Admitting: Urology

## 2020-01-29 VITALS — BP 134/74 | HR 67 | Ht 68.0 in | Wt 210.0 lb

## 2020-01-29 DIAGNOSIS — R399 Unspecified symptoms and signs involving the genitourinary system: Secondary | ICD-10-CM | POA: Diagnosis not present

## 2020-01-29 DIAGNOSIS — Z8546 Personal history of malignant neoplasm of prostate: Secondary | ICD-10-CM | POA: Diagnosis not present

## 2020-01-29 DIAGNOSIS — Z87448 Personal history of other diseases of urinary system: Secondary | ICD-10-CM

## 2020-01-29 NOTE — Progress Notes (Signed)
01/29/2020 10:11 AM   Jonathan [name] [birth year] [id number]  Referring provider: Sofie Hartigan, MD [place],  [place] [address]  Chief Complaint  Patient presents with   Prostate Cancer    Urologic history: 1. T1cadenocarcinoma the prostate              -low risk; dx May 2007, tx radiation-IMRT,PSA 6.5  2.Microhematuria             -found to have a bulb stricture and underwent dilation.   HPI: 86 y.o. male presents for annual follow-up.   No problems since last visit  No voiding complaints; nocturia x1  On tamsulosin but not sure if he is getting any benefit  Denies dysuria, gross hematuria  No flank, abdominal, pelvic pain  PSA 01/27/2020 0.7  PMH: Past Medical History:  Diagnosis Date   Cancer ([facility])    prostate   Hypercholesteremia    Hypertension    Pre-diabetes     Surgical History: Past Surgical History:  Procedure Laterality Date   PROSTATE SURGERY     SKIN BIOPSY     basel cell    Home Medications:  Allergies as of 01/29/2020      Reactions   Penicillin V Potassium Other (See Comments)   Penicillins Rash      Medication List       Accurate as of January 29, 2020 10:11 AM. If you have any questions, ask your nurse or doctor.        acetaminophen 500 MG tablet Commonly known as: TYLENOL Take 1 tablet (500 mg total) by mouth every 6 (six) hours as needed.   B-12 500 MCG Tabs Take 1 tablet by mouth daily.   hydrochlorothiazide 25 MG tablet Commonly known as: HYDRODIURIL Take 25 mg by mouth daily.   lisinopril 20 MG tablet Commonly known as: ZESTRIL Take 20 mg by mouth daily.   lisinopril-hydrochlorothiazide 20-25 MG tablet Commonly known as: ZESTORETIC Take 1 tablet by mouth daily.   simvastatin 10 MG tablet Commonly known as: ZOCOR Take 10 mg by mouth daily.   tamsulosin 0.4 MG Caps capsule Commonly known as: FLOMAX TAKE 1 CAPSULE BY MOUTH EVERY DAY   Vitamin D 50 MCG (2000 UT) Caps Take 1  capsule (2,000 Units total) by mouth daily.       Allergies:  Allergies  Allergen Reactions   Penicillin V Potassium Other (See Comments)   Penicillins Rash    Family History: History reviewed. No pertinent family history.  Social History:  reports that he has never smoked. He has never used smokeless tobacco. He reports current alcohol use. He reports that he does not use drugs.   Physical Exam: BP 134/74    Pulse 67    Ht 5\' 8"  (1.727 m)    Wt 210 lb (95.3 kg)    BMI 31.93 kg/m   Constitutional:  Alert and oriented, No acute distress. HEENT: [place] AT, moist mucus membranes.  Trachea midline, no masses. Cardiovascular: No clubbing, cyanosis, or edema. Respiratory: Normal respiratory effort, no increased work of breathing. Skin: No rashes, bruises or suspicious lesions. Neurologic: Grossly intact, no focal deficits, moving all 4 extremities. Psychiatric: Normal mood and affect.   Assessment & Plan:    1.  History of prostate cancer  Slight bump in PSA to 0.7  Lab visit PSA 6 months  Continue annual follow-up  2.  History urethral stricture  Asymptomatic and no evidence recurrence  3.  LUTS/nocturia  Typically  gets up once a night at 315 but able to go back to sleep  Discussed he can try stopping the tamsulosin and if no worsening in voiding symptoms stay off the medication   Abbie Sons, MD  [place] [address], [place], [place] [address] [phone]

## 2020-03-11 DIAGNOSIS — N1831 Chronic kidney disease, stage 3a: Secondary | ICD-10-CM | POA: Diagnosis not present

## 2020-03-11 DIAGNOSIS — E119 Type 2 diabetes mellitus without complications: Secondary | ICD-10-CM | POA: Diagnosis not present

## 2020-03-11 DIAGNOSIS — E7800 Pure hypercholesterolemia, unspecified: Secondary | ICD-10-CM | POA: Diagnosis not present

## 2020-03-11 DIAGNOSIS — E669 Obesity, unspecified: Secondary | ICD-10-CM | POA: Diagnosis not present

## 2020-03-11 DIAGNOSIS — I10 Essential (primary) hypertension: Secondary | ICD-10-CM | POA: Diagnosis not present

## 2020-04-01 DIAGNOSIS — R7989 Other specified abnormal findings of blood chemistry: Secondary | ICD-10-CM | POA: Diagnosis not present

## 2020-04-01 DIAGNOSIS — H26491 Other secondary cataract, right eye: Secondary | ICD-10-CM | POA: Diagnosis not present

## 2020-04-03 ENCOUNTER — Other Ambulatory Visit: Payer: Self-pay | Admitting: Urology

## 2020-06-27 ENCOUNTER — Other Ambulatory Visit: Payer: Self-pay | Admitting: Urology

## 2020-07-27 ENCOUNTER — Other Ambulatory Visit: Payer: Self-pay

## 2020-07-27 DIAGNOSIS — Z8546 Personal history of malignant neoplasm of prostate: Secondary | ICD-10-CM

## 2020-08-01 ENCOUNTER — Other Ambulatory Visit: Payer: PPO

## 2020-08-01 ENCOUNTER — Other Ambulatory Visit: Payer: Self-pay

## 2020-08-01 DIAGNOSIS — Z8546 Personal history of malignant neoplasm of prostate: Secondary | ICD-10-CM | POA: Diagnosis not present

## 2020-08-02 LAB — PSA: Prostate Specific Ag, Serum: 0.5 ng/mL (ref 0.0–4.0)

## 2020-08-04 ENCOUNTER — Encounter: Payer: Self-pay | Admitting: *Deleted

## 2020-09-21 DIAGNOSIS — I10 Essential (primary) hypertension: Secondary | ICD-10-CM | POA: Diagnosis not present

## 2020-09-21 DIAGNOSIS — E7800 Pure hypercholesterolemia, unspecified: Secondary | ICD-10-CM | POA: Diagnosis not present

## 2020-09-21 DIAGNOSIS — E669 Obesity, unspecified: Secondary | ICD-10-CM | POA: Diagnosis not present

## 2020-09-21 DIAGNOSIS — N1831 Chronic kidney disease, stage 3a: Secondary | ICD-10-CM | POA: Diagnosis not present

## 2020-09-21 DIAGNOSIS — Z0000 Encounter for general adult medical examination without abnormal findings: Secondary | ICD-10-CM | POA: Diagnosis not present

## 2020-09-21 DIAGNOSIS — E119 Type 2 diabetes mellitus without complications: Secondary | ICD-10-CM | POA: Diagnosis not present

## 2020-11-03 DIAGNOSIS — Z85828 Personal history of other malignant neoplasm of skin: Secondary | ICD-10-CM | POA: Diagnosis not present

## 2020-11-03 DIAGNOSIS — L578 Other skin changes due to chronic exposure to nonionizing radiation: Secondary | ICD-10-CM | POA: Diagnosis not present

## 2020-11-03 DIAGNOSIS — L570 Actinic keratosis: Secondary | ICD-10-CM | POA: Diagnosis not present

## 2020-11-03 DIAGNOSIS — Z872 Personal history of diseases of the skin and subcutaneous tissue: Secondary | ICD-10-CM | POA: Diagnosis not present

## 2020-11-03 DIAGNOSIS — Z86018 Personal history of other benign neoplasm: Secondary | ICD-10-CM | POA: Diagnosis not present

## 2020-11-03 DIAGNOSIS — Z859 Personal history of malignant neoplasm, unspecified: Secondary | ICD-10-CM | POA: Diagnosis not present

## 2021-01-02 DIAGNOSIS — Z20822 Contact with and (suspected) exposure to covid-19: Secondary | ICD-10-CM | POA: Diagnosis not present

## 2021-01-06 DIAGNOSIS — Z03818 Encounter for observation for suspected exposure to other biological agents ruled out: Secondary | ICD-10-CM | POA: Diagnosis not present

## 2021-01-23 DIAGNOSIS — J019 Acute sinusitis, unspecified: Secondary | ICD-10-CM | POA: Diagnosis not present

## 2021-01-30 ENCOUNTER — Other Ambulatory Visit: Payer: Self-pay

## 2021-01-30 DIAGNOSIS — Z8546 Personal history of malignant neoplasm of prostate: Secondary | ICD-10-CM

## 2021-01-31 ENCOUNTER — Other Ambulatory Visit: Payer: PPO

## 2021-01-31 ENCOUNTER — Other Ambulatory Visit: Payer: Self-pay

## 2021-01-31 DIAGNOSIS — Z8546 Personal history of malignant neoplasm of prostate: Secondary | ICD-10-CM | POA: Diagnosis not present

## 2021-02-01 ENCOUNTER — Ambulatory Visit: Payer: Self-pay | Admitting: Urology

## 2021-02-01 LAB — PSA: Prostate Specific Ag, Serum: 0.8 ng/mL (ref 0.0–4.0)

## 2021-02-03 ENCOUNTER — Ambulatory Visit (INDEPENDENT_AMBULATORY_CARE_PROVIDER_SITE_OTHER): Payer: PPO | Admitting: Urology

## 2021-02-03 ENCOUNTER — Other Ambulatory Visit: Payer: Self-pay

## 2021-02-03 VITALS — BP 121/65 | HR 80 | Ht 69.0 in | Wt 215.0 lb

## 2021-02-03 DIAGNOSIS — Z8546 Personal history of malignant neoplasm of prostate: Secondary | ICD-10-CM

## 2021-02-03 NOTE — Progress Notes (Signed)
02/03/2021 1:01 PM   Jonathan [name] [birth year] [id number]  Referring provider: Sofie Hartigan, MD [address],  [place] [address]  Chief Complaint  Patient presents with   Prostate Cancer    1year follow up    Urologic history: 1. T1c adenocarcinoma the prostate              -low risk; dx May 2007, tx radiation-IMRT, PSA 6.5   2. Microhematuria              -found to have a bulb stricture and underwent dilation.    HPI: 87 y.o. male presents for annual follow-up.  No significant change since last years visit Has had a lingering URI recently with 2 antibiotic courses; was COVID-negative Stable LUTS which are not bothersome, gets up once per night to void Discontinue tamsulosin Denies dysuria, gross hematuria No flank, abdominal or pelvic pain PSA 01/31/2021 slight increase at 0.8    PMH: Past Medical History:  Diagnosis Date   Cancer ([place])    prostate   Hypercholesteremia    Hypertension    Pre-diabetes     Surgical History: Past Surgical History:  Procedure Laterality Date   PROSTATE SURGERY     SKIN BIOPSY     basel cell    Home Medications:  Allergies as of 02/03/2021       Reactions   Penicillin V Potassium Other (See Comments)   Penicillins Rash        Medication List        Accurate as of February 03, 2021  1:01 PM. If you have any questions, ask your nurse or doctor.          STOP taking these medications    lisinopril-hydrochlorothiazide 20-25 MG tablet Commonly known as: ZESTORETIC Stopped by: Abbie Sons, MD   tamsulosin 0.4 MG Caps capsule Commonly known as: FLOMAX Stopped by: Abbie Sons, MD       TAKE these medications    acetaminophen 500 MG tablet Commonly known as: TYLENOL Take 1 tablet (500 mg total) by mouth every 6 (six) hours as needed.   B-12 500 MCG Tabs Take 1 tablet by mouth daily.   hydrochlorothiazide 25 MG tablet Commonly known as: HYDRODIURIL Take 25 mg by mouth daily.    lisinopril 20 MG tablet Commonly known as: ZESTRIL Take 20 mg by mouth daily.   simvastatin 10 MG tablet Commonly known as: ZOCOR Take 10 mg by mouth daily.   Vitamin D 50 MCG (2000 UT) Caps Take 1 capsule (2,000 Units total) by mouth daily.        Allergies:  Allergies  Allergen Reactions   Penicillin V Potassium Other (See Comments)   Penicillins Rash    Family History: No family history on file.  Social History:  reports that he has never smoked. He has never used smokeless tobacco. He reports current alcohol use. He reports that he does not use drugs.   Physical Exam: BP 121/65   Pulse 80   Ht '5\' 9"'$  (1.753 m)   Wt 215 lb (97.5 kg)   BMI 31.75 kg/m   Constitutional:  Alert and oriented, No acute distress. HEENT: [place] AT, moist mucus membranes.  Trachea midline, no masses. Cardiovascular: No clubbing, cyanosis, or edema. Respiratory: Normal respiratory effort, no increased work of breathing.   Assessment & Plan:    1.  History of prostate cancer Slight bump in PSA, lab visit for recheck 6 months Continue annual follow-up  Abbie Sons, [place] [address] [place], [place] [address] [phone]

## 2021-02-04 ENCOUNTER — Encounter: Payer: Self-pay | Admitting: Urology

## 2021-03-13 ENCOUNTER — Emergency Department
Admission: EM | Admit: 2021-03-13 | Discharge: 2021-03-13 | Disposition: A | Discharge status: Home / Self Care | Payer: PPO | Attending: Emergency Medicine | Admitting: Emergency Medicine

## 2021-03-13 ENCOUNTER — Other Ambulatory Visit: Payer: Self-pay

## 2021-03-13 ENCOUNTER — Encounter: Payer: Self-pay | Admitting: *Deleted

## 2021-03-13 DIAGNOSIS — S0990XA Unspecified injury of head, initial encounter: Secondary | ICD-10-CM | POA: Diagnosis not present

## 2021-03-13 DIAGNOSIS — Z8546 Personal history of malignant neoplasm of prostate: Secondary | ICD-10-CM | POA: Diagnosis not present

## 2021-03-13 DIAGNOSIS — I10 Essential (primary) hypertension: Secondary | ICD-10-CM | POA: Diagnosis not present

## 2021-03-13 DIAGNOSIS — W2104XA Struck by golf ball, initial encounter: Secondary | ICD-10-CM | POA: Insufficient documentation

## 2021-03-13 NOTE — ED Triage Notes (Signed)
Pt ambulatory to triage   pt was hit in the forehead with a golf ball.  No loc  no vomiting.  Small abrasion to forehead.   No blood thinners. No etoh  pt alert  speech clear

## 2021-03-13 NOTE — ED Provider Notes (Signed)
[facility] Emergency Department Provider Note ____________________________________________   Event Date/Time   First MD Initiated Contact with Patient 03/13/21 1620     (approximate)  I have reviewed the triage vital signs and the nursing notes.  HISTORY  Chief Complaint Head Injury   HPI Jonathan [name] is a 87 y.o. malewho presents to the ED for evaluation of accidental head injury.  Chart review indicates history of HTN, HLD and prostate cancer.  No thinners.  Patient reports accidentally being struck in the forehead with a golf ball this morning around 9 AM.  Reports he was playing golf with a seniors group when the group behind him hit a ball over a hill, striking him directly in the forehead.  He denies any falls to the ground, syncope or additional injury.  Denies any subsequent syncope or emesis.  Reports his forehead is stinging some, but denies any global headache, vision changes or weakness.  He reports he finished the game of golf, and after the game he went to the local walk-in clinic, but they directed him here for evaluation of head trauma.  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 Authorizing Provider  acetaminophen (TYLENOL) 500 MG tablet Take 1 tablet (500 mg total) by mouth every 6 (six) hours as needed. 12/09/15   Plonk, Gwyndolyn Saxon, MD  Cholecalciferol (VITAMIN D) 2000 units CAPS Take 1 capsule (2,000 Units total) by mouth daily. 12/09/15   Plonk, Gwyndolyn Saxon, MD  Cyanocobalamin (B-12) 500 MCG TABS Take 1 tablet by mouth daily. 07/02/17   Plonk, Gwyndolyn Saxon, MD  hydrochlorothiazide (HYDRODIURIL) 25 MG tablet Take 25 mg by mouth daily. 01/11/20   [provider]  lisinopril (ZESTRIL) 20 MG tablet Take 20 mg by mouth daily. 01/11/20   [provider]  simvastatin (ZOCOR) 10 MG tablet Take 10 mg by mouth daily.  12/17/18   [provider]    Allergies Penicillin v potassium and Penicillins  No family history on file.  Social History Social History   Tobacco Use   Smoking status: Never   Smokeless tobacco: Never  Vaping Use   Vaping Use: Never used  Substance Use Topics   Alcohol use: Yes   Drug use: Never    Review of Systems  Constitutional: No fever/chills Eyes: No visual changes. ENT: No sore throat. Cardiovascular: Denies chest pain. Respiratory: Denies shortness of breath. Gastrointestinal: No abdominal pain.  No nausea, no vomiting.  No diarrhea.  No constipation. Genitourinary: Negative for dysuria. Musculoskeletal: Negative for back pain. Skin: Negative for rash. Neurological: Negative for focal weakness or numbness.    ____________________________________________  PHYSICAL EXAM:  VITAL SIGNS: Vitals:   03/13/21 1551  BP: (!) 146/75  Pulse: 67  Resp: 18  Temp: 98.5 F (36.9 C)  SpO2: 99%     Constitutional: Alert and oriented. Well appearing and in no acute distress. Eyes: Conjunctivae are normal. PERRL. EOMI. Head: Abrasion to the midline center forehead without surrounding step-offs.  Small associated hematoma, about 2 cm in diameter.  No discrete laceration  to require repair. Nose: No congestion/rhinnorhea. Mouth/Throat: Mucous membranes are moist.  Oropharynx non-erythematous. Neck: No stridor. No cervical spine tenderness to palpation. Cardiovascular: Normal rate, regular rhythm. Grossly normal heart sounds.  Good peripheral circulation. Respiratory: Normal respiratory effort.  No retractions. Lungs CTAB. Gastrointestinal: Soft , nondistended, nontender to palpation. No CVA tenderness. Musculoskeletal: No lower extremity tenderness nor edema.  No joint effusions. No signs of acute trauma. Neurologic:  Normal speech and language. No gross focal neurologic deficits are appreciated. No gait instability noted. Cranial nerves II through XII intact 5/5 strength and sensation in all 4 extremities Skin:  Skin is warm, dry and intact. No rash noted. Psychiatric: Mood and affect are normal. Speech and behavior are normal.  ____________________________________________   LABS (all labs ordered are listed, but only abnormal results are displayed)  Labs Reviewed - No data to display ____________________________________________  12 Lead EKG   ____________________________________________  RADIOLOGY  ED MD interpretation:    Official radiology report(s): No results found.  ____________________________________________   PROCEDURES and INTERVENTIONS  Procedure(s) performed (including Critical Care):  Procedures  Medications - No data to display  ____________________________________________   MDM / ED COURSE   Quite healthy 87 year old male presents to the ED with minor head injury, requiring local wound care and outpatient management.  Normal vitals.  No indication for CT head, and I discussed with the patient who believes this is superfluous, which I think is reasonable.  He looks well and it has already been 7 or 8 hours since this injury.  I am reassured that he finished the game of golf.  I cleaned up the abrasion and apply some  Steri-Strips and we discussed wound care regimen at home.  We discussed return precautions.  Patient suitable for outpatient management.     ____________________________________________   FINAL CLINICAL IMPRESSION(S) / ED DIAGNOSES  Final diagnoses:  Minor head injury, initial encounter     ED Discharge Orders     None        Kreston Ahrendt   Note:  This document was prepared using Dragon voice recognition software and may include unintentional dictation errors.    Vladimir Crofts, MD 03/13/21 [phone]

## 2021-03-13 NOTE — Discharge Instructions (Signed)
Use Tylenol for pain and fevers.  Up to 1000 mg per dose, up to 4 times per day.  Do not take more than 4000 mg of Tylenol/acetaminophen within 24 hours.Marland Kitchen  Keep that area clean and dry.  You can shower and gently wash it with warm soap and water, but avoid submerging it in a bath or swimming for at least 1 week.  Gently pat dry, then apply a Neosporin or bacitracin antibiotic-type ointment.  Return to the ED with any pus coming from his wound, spreading red rash, severe headaches, vision changes or passing out.

## 2021-03-23 DIAGNOSIS — J019 Acute sinusitis, unspecified: Secondary | ICD-10-CM | POA: Diagnosis not present

## 2021-03-23 DIAGNOSIS — H9203 Otalgia, bilateral: Secondary | ICD-10-CM | POA: Diagnosis not present

## 2021-03-23 DIAGNOSIS — H9193 Unspecified hearing loss, bilateral: Secondary | ICD-10-CM | POA: Diagnosis not present

## 2021-03-23 DIAGNOSIS — J302 Other seasonal allergic rhinitis: Secondary | ICD-10-CM | POA: Diagnosis not present

## 2021-03-23 DIAGNOSIS — H6121 Impacted cerumen, right ear: Secondary | ICD-10-CM | POA: Diagnosis not present

## 2021-03-23 DIAGNOSIS — B9689 Other specified bacterial agents as the cause of diseases classified elsewhere: Secondary | ICD-10-CM | POA: Diagnosis not present

## 2021-03-23 DIAGNOSIS — H66003 Acute suppurative otitis media without spontaneous rupture of ear drum, bilateral: Secondary | ICD-10-CM | POA: Diagnosis not present

## 2021-03-28 DIAGNOSIS — L570 Actinic keratosis: Secondary | ICD-10-CM | POA: Diagnosis not present

## 2021-03-28 DIAGNOSIS — C44629 Squamous cell carcinoma of skin of left upper limb, including shoulder: Secondary | ICD-10-CM | POA: Diagnosis not present

## 2021-03-28 DIAGNOSIS — D485 Neoplasm of uncertain behavior of skin: Secondary | ICD-10-CM | POA: Diagnosis not present

## 2021-04-07 DIAGNOSIS — H26491 Other secondary cataract, right eye: Secondary | ICD-10-CM | POA: Diagnosis not present

## 2021-04-11 DIAGNOSIS — C44629 Squamous cell carcinoma of skin of left upper limb, including shoulder: Secondary | ICD-10-CM | POA: Diagnosis not present

## 2021-04-14 DIAGNOSIS — I10 Essential (primary) hypertension: Secondary | ICD-10-CM | POA: Diagnosis not present

## 2021-04-14 DIAGNOSIS — N1831 Chronic kidney disease, stage 3a: Secondary | ICD-10-CM | POA: Diagnosis not present

## 2021-04-14 DIAGNOSIS — E119 Type 2 diabetes mellitus without complications: Secondary | ICD-10-CM | POA: Diagnosis not present

## 2021-04-14 DIAGNOSIS — E7800 Pure hypercholesterolemia, unspecified: Secondary | ICD-10-CM | POA: Diagnosis not present

## 2021-04-14 DIAGNOSIS — E669 Obesity, unspecified: Secondary | ICD-10-CM | POA: Diagnosis not present

## 2021-04-19 DIAGNOSIS — H6983 Other specified disorders of Eustachian tube, bilateral: Secondary | ICD-10-CM | POA: Diagnosis not present

## 2021-04-19 DIAGNOSIS — H93293 Other abnormal auditory perceptions, bilateral: Secondary | ICD-10-CM | POA: Diagnosis not present

## 2021-06-12 DIAGNOSIS — H26491 Other secondary cataract, right eye: Secondary | ICD-10-CM | POA: Diagnosis not present

## 2021-06-23 DIAGNOSIS — J019 Acute sinusitis, unspecified: Secondary | ICD-10-CM | POA: Diagnosis not present

## 2021-08-08 ENCOUNTER — Other Ambulatory Visit: Payer: Self-pay

## 2021-08-08 ENCOUNTER — Other Ambulatory Visit: Payer: PPO

## 2021-08-08 DIAGNOSIS — Z8546 Personal history of malignant neoplasm of prostate: Secondary | ICD-10-CM

## 2021-08-09 LAB — PSA: Prostate Specific Ag, Serum: 0.8 ng/mL (ref 0.0–4.0)

## 2021-08-10 ENCOUNTER — Encounter: Payer: Self-pay | Admitting: *Deleted

## 2021-09-18 DIAGNOSIS — D044 Carcinoma in situ of skin of scalp and neck: Secondary | ICD-10-CM | POA: Diagnosis not present

## 2021-09-18 DIAGNOSIS — L820 Inflamed seborrheic keratosis: Secondary | ICD-10-CM | POA: Diagnosis not present

## 2021-09-18 DIAGNOSIS — D485 Neoplasm of uncertain behavior of skin: Secondary | ICD-10-CM | POA: Diagnosis not present

## 2021-10-24 DIAGNOSIS — C4442 Squamous cell carcinoma of skin of scalp and neck: Secondary | ICD-10-CM | POA: Diagnosis not present

## 2021-10-24 DIAGNOSIS — L988 Other specified disorders of the skin and subcutaneous tissue: Secondary | ICD-10-CM | POA: Diagnosis not present

## 2021-11-03 DIAGNOSIS — Z0000 Encounter for general adult medical examination without abnormal findings: Secondary | ICD-10-CM | POA: Diagnosis not present

## 2021-11-03 DIAGNOSIS — E669 Obesity, unspecified: Secondary | ICD-10-CM | POA: Diagnosis not present

## 2021-11-03 DIAGNOSIS — N1831 Chronic kidney disease, stage 3a: Secondary | ICD-10-CM | POA: Diagnosis not present

## 2021-11-03 DIAGNOSIS — I10 Essential (primary) hypertension: Secondary | ICD-10-CM | POA: Diagnosis not present

## 2021-11-03 DIAGNOSIS — E7800 Pure hypercholesterolemia, unspecified: Secondary | ICD-10-CM | POA: Diagnosis not present

## 2021-11-03 DIAGNOSIS — E119 Type 2 diabetes mellitus without complications: Secondary | ICD-10-CM | POA: Diagnosis not present

## 2021-11-07 DIAGNOSIS — C4442 Squamous cell carcinoma of skin of scalp and neck: Secondary | ICD-10-CM | POA: Diagnosis not present

## 2021-11-07 DIAGNOSIS — D044 Carcinoma in situ of skin of scalp and neck: Secondary | ICD-10-CM | POA: Diagnosis not present

## 2021-12-27 DIAGNOSIS — Z859 Personal history of malignant neoplasm, unspecified: Secondary | ICD-10-CM | POA: Diagnosis not present

## 2021-12-27 DIAGNOSIS — L578 Other skin changes due to chronic exposure to nonionizing radiation: Secondary | ICD-10-CM | POA: Diagnosis not present

## 2021-12-27 DIAGNOSIS — L570 Actinic keratosis: Secondary | ICD-10-CM | POA: Diagnosis not present

## 2021-12-27 DIAGNOSIS — Z872 Personal history of diseases of the skin and subcutaneous tissue: Secondary | ICD-10-CM | POA: Diagnosis not present

## 2021-12-27 DIAGNOSIS — Z86018 Personal history of other benign neoplasm: Secondary | ICD-10-CM | POA: Diagnosis not present

## 2021-12-27 DIAGNOSIS — Z85828 Personal history of other malignant neoplasm of skin: Secondary | ICD-10-CM | POA: Diagnosis not present

## 2022-01-16 DIAGNOSIS — L570 Actinic keratosis: Secondary | ICD-10-CM | POA: Diagnosis not present

## 2022-01-30 ENCOUNTER — Other Ambulatory Visit: Payer: PPO

## 2022-01-30 DIAGNOSIS — Z8546 Personal history of malignant neoplasm of prostate: Secondary | ICD-10-CM | POA: Diagnosis not present

## 2022-01-31 LAB — PSA: Prostate Specific Ag, Serum: 0.7 ng/mL (ref 0.0–4.0)

## 2022-02-02 ENCOUNTER — Ambulatory Visit: Payer: PPO | Admitting: Urology

## 2022-02-02 ENCOUNTER — Encounter: Payer: Self-pay | Admitting: Urology

## 2022-02-02 VITALS — BP 132/73 | HR 75 | Ht 70.0 in | Wt 220.0 lb

## 2022-02-02 DIAGNOSIS — Z8546 Personal history of malignant neoplasm of prostate: Secondary | ICD-10-CM | POA: Diagnosis not present

## 2022-02-02 NOTE — Progress Notes (Signed)
   02/02/2022 1:27 PM   Jonathan [name] 1933/07/18 [id number]  Referring provider: Sofie Hartigan, MD [place],  [place] [address]  Chief Complaint  Patient presents with   Prostate Cancer    Urologic history: 1. T1c adenocarcinoma the prostate              -low risk; dx May 2007, tx radiation-IMRT, PSA 6.5   2. Microhematuria              -found to have a bulb stricture and underwent dilation.    HPI: 88 y.o. male presents for annual follow-up.  Doing well since last visit No bothersome LUTS; occasional urgency but not bothersome enough that he desires medication Denies dysuria, gross hematuria Denies flank, abdominal or pelvic pain PSA 01/30/2022 slight increase at 0.7    PMH: Past Medical History:  Diagnosis Date   Cancer ([place])    prostate   Hypercholesteremia    Hypertension    Pre-diabetes     Surgical History: Past Surgical History:  Procedure Laterality Date   PROSTATE SURGERY     SKIN BIOPSY     basel cell    Home Medications:  Allergies as of 02/02/2022       Reactions   Penicillin V Potassium Other (See Comments)   Penicillins Rash        Medication List        Accurate as of February 02, 2022  1:27 PM. If you have any questions, ask your nurse or doctor.          acetaminophen 500 MG tablet Commonly known as: TYLENOL Take 1 tablet (500 mg total) by mouth every 6 (six) hours as needed.   B-12 500 MCG Tabs Take 1 tablet by mouth daily.   hydrochlorothiazide 25 MG tablet Commonly known as: HYDRODIURIL Take 25 mg by mouth daily.   lisinopril 20 MG tablet Commonly known as: ZESTRIL Take 20 mg by mouth daily.   simvastatin 10 MG tablet Commonly known as: ZOCOR Take 10 mg by mouth daily.   Vitamin D 50 MCG (2000 UT) Caps Take 1 capsule (2,000 Units total) by mouth daily.        Allergies:  Allergies  Allergen Reactions   Penicillin V Potassium Other (See Comments)   Penicillins Rash    Family  History: History reviewed. No pertinent family history.  Social History:  reports that he has never smoked. He has never used smokeless tobacco. He reports current alcohol use. He reports that he does not use drugs.   Physical Exam: BP 132/73   Pulse 75   Ht '5\' 10"'$  (1.778 m)   Wt 220 lb (99.8 kg)   BMI 31.57 kg/m   Constitutional:  Alert and oriented, No acute distress. HEENT: [place] AT, moist mucus membranes.  Trachea midline, no masses. Cardiovascular: No clubbing, cyanosis, or edema. Respiratory: Normal respiratory effort, no increased work of breathing.   Assessment & Plan:    1.  History of prostate cancer Stable PSA Desires to continue annual follow-up   Abbie Sons, MD  [place] [address] [place], [place] [address] [phone]

## 2022-05-11 ENCOUNTER — Telehealth: Payer: Self-pay | Admitting: *Deleted

## 2022-05-11 NOTE — Patient Outreach (Signed)
  Care Coordination   Initial Visit Note   05/11/2022 Name: Jonathan [name] MRN: [id number] DOB: [birth year]  Jonathan [name] is a 88 y.o. year old male who sees Feldpausch, Chrissie Noa, MD for primary care. I spoke with  Jonathan [name] by phone today.  What matters to the patients health and wellness today?  No health concerns voiced. RN discussed [place], RN, SW, and Pharmacist. Patient declined services.     Goals Addressed             This Visit's Progress    Patient Stated       Advised to follow up for annual wellness visits and vaccines        SDOH assessments and interventions completed:  Yes  SDOH Interventions Today    Flowsheet Row Most Recent Value  SDOH Interventions   Food Insecurity Interventions Intervention Not Indicated  Housing Interventions Intervention Not Indicated  Transportation Interventions Intervention Not Indicated        Care Coordination Interventions Activated:  Yes  Care Coordination Interventions:  Yes, provided  Care coordination program/ services discussed  Social determinants of health survey completed Annual wellness visit discussed and patient advised to contact provider office to schedule Patient advised to contact primary care provider office  if care coordination services needed in the future  Follow up plan: No further intervention required.   Encounter Outcome:  Pt. Visit Completed   [facility] [phone]

## 2022-06-08 DIAGNOSIS — N1831 Chronic kidney disease, stage 3a: Secondary | ICD-10-CM | POA: Diagnosis not present

## 2022-06-08 DIAGNOSIS — E7800 Pure hypercholesterolemia, unspecified: Secondary | ICD-10-CM | POA: Diagnosis not present

## 2022-06-08 DIAGNOSIS — I10 Essential (primary) hypertension: Secondary | ICD-10-CM | POA: Diagnosis not present

## 2022-06-08 DIAGNOSIS — E119 Type 2 diabetes mellitus without complications: Secondary | ICD-10-CM | POA: Diagnosis not present

## 2022-06-08 DIAGNOSIS — E669 Obesity, unspecified: Secondary | ICD-10-CM | POA: Diagnosis not present

## 2023-01-31 ENCOUNTER — Other Ambulatory Visit: Payer: Self-pay | Admitting: *Deleted

## 2023-01-31 DIAGNOSIS — Z8546 Personal history of malignant neoplasm of prostate: Secondary | ICD-10-CM

## 2023-02-05 ENCOUNTER — Other Ambulatory Visit: Payer: Medicare HMO

## 2023-02-05 DIAGNOSIS — Z8546 Personal history of malignant neoplasm of prostate: Secondary | ICD-10-CM

## 2023-02-08 ENCOUNTER — Encounter: Payer: Self-pay | Admitting: Urology

## 2023-02-08 ENCOUNTER — Ambulatory Visit: Payer: Medicare HMO | Admitting: Urology

## 2023-02-08 VITALS — BP 131/75 | HR 64 | Ht 70.0 in | Wt 215.0 lb

## 2023-02-08 DIAGNOSIS — Z8546 Personal history of malignant neoplasm of prostate: Secondary | ICD-10-CM | POA: Diagnosis not present

## 2023-02-08 NOTE — Progress Notes (Signed)
I, Jonathan [name], acting as a scribe for Jonathan Altes, MD., have documented all relevant documentation on the behalf of Jonathan Altes, MD, as directed by Jonathan Altes, MD while in the presence of Jonathan Altes, MD.  02/08/2023 1:10 PM   Estelle June 19-May-1934 [id number]  Referring provider: Marina Goodell, MD [address] [place],  [place] [address]  Chief Complaint  Patient presents with   Prostate Cancer   Urologic history: 1. T1c adenocarcinoma the prostate  low risk; dx May 2007, tx radiation-IMRT, PSA 6.5   2. Microhematuria  found to have a bulb stricture and underwent dilation.  HPI: Jonathan [name] is a 89 y.o. male presents for annual follow-up.   Doing well since last visit No bothersome LUTS Denies dysuria, gross hematuria Denies flank, abdominal or pelvic pain PSA 02/05/2023 stable at 0.8  PSA trend   Prostate Specific Ag, Serum  Latest Ref Rng 0.0 - 4.0 ng/mL  01/19/2019 0.3   01/27/2020 0.7   08/01/2020 0.5   01/31/2021 0.8   08/08/2021 0.8   01/30/2022 0.7   02/05/2023 0.8      PMH: Past Medical History:  Diagnosis Date   Cancer ([facility])    prostate   Hypercholesteremia    Hypertension    Pre-diabetes     Surgical History: Past Surgical History:  Procedure Laterality Date   PROSTATE SURGERY     SKIN BIOPSY     basel cell    Home Medications:  Allergies as of 02/08/2023       Reactions   Penicillin V Potassium Other (See Comments)   Penicillins Rash        Medication List        Accurate as of February 08, 2023  1:10 PM. If you have any questions, ask your nurse or doctor.          acetaminophen 500 MG tablet Commonly known as: TYLENOL Take 1 tablet (500 mg total) by mouth every 6 (six) hours as needed.   B-12 500 MCG Tabs Take 1 tablet by mouth daily.   hydrochlorothiazide 25 MG tablet Commonly known as: HYDRODIURIL Take 25 mg by mouth daily.   lisinopril 20 MG tablet Commonly known as: ZESTRIL Take 20 mg  by mouth daily.   simvastatin 10 MG tablet Commonly known as: ZOCOR Take 10 mg by mouth daily.   Vitamin D 50 MCG (2000 UT) Caps Take 1 capsule (2,000 Units total) by mouth daily.        Allergies:  Allergies  Allergen Reactions   Penicillin V Potassium Other (See Comments)   Penicillins Rash    Social History:  reports that he has never smoked. He has never used smokeless tobacco. He reports current alcohol use. He reports that he does not use drugs.   Physical Exam: BP 131/75 (BP Location: Left Arm, Patient Position: Sitting, Cuff Size: Large)   Pulse 64   Ht 5\' 10"  (1.778 m)   Wt 215 lb (97.5 kg)   BMI 30.85 kg/m   Constitutional:  Alert and oriented, No acute distress. HEENT: [place] AT Respiratory: Normal respiratory effort, no increased work of breathing. Psychiatric: Normal mood and affect.   Assessment & Plan:    1.  History of prostate cancer Stable PSA Desires to continue annual follow-up  I have reviewed the above documentation for accuracy and completeness, and I agree with the above.   Jonathan Altes, MD  [facility] [phone]  [address] [place], [place] [address] [phone]

## 2023-03-01 ENCOUNTER — Emergency Department: Payer: Medicare HMO

## 2023-03-01 ENCOUNTER — Observation Stay: Payer: Medicare HMO

## 2023-03-01 ENCOUNTER — Encounter: Payer: Self-pay | Admitting: Intensive Care

## 2023-03-01 ENCOUNTER — Other Ambulatory Visit: Payer: Self-pay

## 2023-03-01 ENCOUNTER — Observation Stay
Admission: EM | Admit: 2023-03-01 | Discharge: 2023-03-02 | Disposition: A | Discharge status: Home / Self Care | Payer: Medicare HMO | Attending: Osteopathic Medicine | Admitting: Osteopathic Medicine

## 2023-03-01 DIAGNOSIS — I6782 Cerebral ischemia: Secondary | ICD-10-CM | POA: Insufficient documentation

## 2023-03-01 DIAGNOSIS — Z85828 Personal history of other malignant neoplasm of skin: Secondary | ICD-10-CM | POA: Diagnosis not present

## 2023-03-01 DIAGNOSIS — R29818 Other symptoms and signs involving the nervous system: Secondary | ICD-10-CM | POA: Diagnosis present

## 2023-03-01 DIAGNOSIS — Z79899 Other long term (current) drug therapy: Secondary | ICD-10-CM | POA: Insufficient documentation

## 2023-03-01 DIAGNOSIS — E785 Hyperlipidemia, unspecified: Secondary | ICD-10-CM | POA: Diagnosis present

## 2023-03-01 DIAGNOSIS — G459 Transient cerebral ischemic attack, unspecified: Secondary | ICD-10-CM

## 2023-03-01 DIAGNOSIS — I6389 Other cerebral infarction: Secondary | ICD-10-CM | POA: Diagnosis not present

## 2023-03-01 DIAGNOSIS — E1122 Type 2 diabetes mellitus with diabetic chronic kidney disease: Secondary | ICD-10-CM | POA: Insufficient documentation

## 2023-03-01 DIAGNOSIS — E669 Obesity, unspecified: Secondary | ICD-10-CM | POA: Diagnosis present

## 2023-03-01 DIAGNOSIS — R2990 Unspecified symptoms and signs involving the nervous system: Secondary | ICD-10-CM | POA: Diagnosis not present

## 2023-03-01 DIAGNOSIS — N1831 Chronic kidney disease, stage 3a: Secondary | ICD-10-CM | POA: Diagnosis present

## 2023-03-01 DIAGNOSIS — I10 Essential (primary) hypertension: Secondary | ICD-10-CM | POA: Diagnosis present

## 2023-03-01 DIAGNOSIS — N179 Acute kidney failure, unspecified: Secondary | ICD-10-CM | POA: Diagnosis not present

## 2023-03-01 DIAGNOSIS — Z8546 Personal history of malignant neoplasm of prostate: Secondary | ICD-10-CM

## 2023-03-01 DIAGNOSIS — R7303 Prediabetes: Secondary | ICD-10-CM | POA: Diagnosis present

## 2023-03-01 DIAGNOSIS — I129 Hypertensive chronic kidney disease with stage 1 through stage 4 chronic kidney disease, or unspecified chronic kidney disease: Secondary | ICD-10-CM | POA: Diagnosis not present

## 2023-03-01 DIAGNOSIS — E66811 Obesity, class 1: Secondary | ICD-10-CM | POA: Diagnosis present

## 2023-03-01 DIAGNOSIS — I4891 Unspecified atrial fibrillation: Secondary | ICD-10-CM | POA: Diagnosis not present

## 2023-03-01 DIAGNOSIS — I639 Cerebral infarction, unspecified: Principal | ICD-10-CM

## 2023-03-01 LAB — CBC
HCT: 36.4 % — ABNORMAL LOW (ref 39.0–52.0)
Hemoglobin: 12.2 g/dL — ABNORMAL LOW (ref 13.0–17.0)
MCH: 31.4 pg (ref 26.0–34.0)
MCHC: 33.5 g/dL (ref 30.0–36.0)
MCV: 93.6 fL (ref 80.0–100.0)
Platelets: 232 10*3/uL (ref 150–400)
RBC: 3.89 MIL/uL — ABNORMAL LOW (ref 4.22–5.81)
RDW: 13.3 % (ref 11.5–15.5)
WBC: 6.2 10*3/uL (ref 4.0–10.5)
nRBC: 0 % (ref 0.0–0.2)

## 2023-03-01 LAB — COMPREHENSIVE METABOLIC PANEL
ALT: 14 U/L (ref 0–44)
AST: 16 U/L (ref 15–41)
Albumin: 4.2 g/dL (ref 3.5–5.0)
Alkaline Phosphatase: 99 U/L (ref 38–126)
Anion gap: 11 (ref 5–15)
BUN: 27 mg/dL — ABNORMAL HIGH (ref 8–23)
CO2: 25 mmol/L (ref 22–32)
Calcium: 8.9 mg/dL (ref 8.9–10.3)
Chloride: 102 mmol/L (ref 98–111)
Creatinine, Ser: 1.39 mg/dL — ABNORMAL HIGH (ref 0.61–1.24)
GFR, Estimated: 49 mL/min — ABNORMAL LOW (ref >60)
Glucose, Bld: 115 mg/dL — ABNORMAL HIGH (ref 70–99)
Potassium: 3.9 mmol/L (ref 3.5–5.1)
Sodium: 138 mmol/L (ref 135–145)
Total Bilirubin: 1.4 mg/dL — ABNORMAL HIGH (ref 0.3–1.2)
Total Protein: 7.3 g/dL (ref 6.5–8.1)

## 2023-03-01 LAB — PROTIME-INR
INR: 1.1 (ref 0.8–1.2)
Prothrombin Time: 14.2 s (ref 11.4–15.2)

## 2023-03-01 LAB — GLUCOSE, CAPILLARY: Glucose-Capillary: 104 mg/dL — ABNORMAL HIGH (ref 70–99)

## 2023-03-01 LAB — APTT: aPTT: 31 s (ref 24–36)

## 2023-03-01 LAB — ETHANOL: Alcohol, Ethyl (B): <10 mg/dL (ref <10)

## 2023-03-01 LAB — CBG MONITORING, ED
Glucose-Capillary: 109 mg/dL — ABNORMAL HIGH (ref 70–99)
Glucose-Capillary: 123 mg/dL — ABNORMAL HIGH (ref 70–99)

## 2023-03-01 LAB — HEMOGLOBIN A1C
Hgb A1c MFr Bld: 6 % — ABNORMAL HIGH (ref 4.8–5.6)
Mean Plasma Glucose: 125.5 mg/dL

## 2023-03-01 LAB — DIFFERENTIAL
Abs Immature Granulocytes: 0.04 10*3/uL (ref 0.00–0.07)
Basophils Absolute: 0.1 10*3/uL (ref 0.0–0.1)
Basophils Relative: 1 %
Eosinophils Absolute: 0.4 10*3/uL (ref 0.0–0.5)
Eosinophils Relative: 6 %
Immature Granulocytes: 1 %
Lymphocytes Relative: 20 %
Lymphs Abs: 1.2 10*3/uL (ref 0.7–4.0)
Monocytes Absolute: 0.6 10*3/uL (ref 0.1–1.0)
Monocytes Relative: 10 %
Neutro Abs: 3.8 10*3/uL (ref 1.7–7.7)
Neutrophils Relative %: 62 %

## 2023-03-01 MED ORDER — ACETAMINOPHEN 325 MG PO TABS: 650.0000 mg | ORAL_TABLET | ORAL | Status: DC | PRN

## 2023-03-01 MED ORDER — ACETAMINOPHEN 160 MG/5ML PO SOLN: 650.0000 mg | ORAL | Status: DC | PRN

## 2023-03-01 MED ORDER — INSULIN ASPART 100 UNIT/ML IJ SOLN: 0.0000 [IU] | Freq: Three times a day (TID) | INTRAMUSCULAR | Status: DC

## 2023-03-01 MED ORDER — ATORVASTATIN CALCIUM 20 MG PO TABS
40.0000 mg | ORAL_TABLET | Freq: Every day | ORAL | Status: DC
  Administered 2023-03-01 – 2023-03-02 (×2): 40 mg via ORAL
  Filled 2023-03-01 (×2): qty 2

## 2023-03-01 MED ORDER — SENNOSIDES-DOCUSATE SODIUM 8.6-50 MG PO TABS: 1.0000 | ORAL_TABLET | Freq: Every evening | ORAL | Status: DC | PRN

## 2023-03-01 MED ORDER — SODIUM CHLORIDE 0.9 % IV SOLN: INTRAVENOUS | Status: DC

## 2023-03-01 MED ORDER — ASPIRIN 300 MG RE SUPP: 300.0000 mg | Freq: Every day | RECTAL | Status: DC

## 2023-03-01 MED ORDER — GADOBUTROL 1 MMOL/ML IV SOLN
10.0000 mL | Freq: Once | INTRAVENOUS | Status: AC | PRN
  Administered 2023-03-01: 10 mL via INTRAVENOUS

## 2023-03-01 MED ORDER — ASPIRIN 325 MG PO TABS
325.0000 mg | ORAL_TABLET | Freq: Every day | ORAL | Status: DC
  Administered 2023-03-01 – 2023-03-02 (×2): 325 mg via ORAL
  Filled 2023-03-01 (×2): qty 1

## 2023-03-01 MED ORDER — ACETAMINOPHEN 650 MG RE SUPP: 650.0000 mg | RECTAL | Status: DC | PRN

## 2023-03-01 MED ORDER — STROKE: EARLY STAGES OF RECOVERY BOOK: Freq: Once | Status: AC

## 2023-03-01 MED ORDER — ENOXAPARIN SODIUM 60 MG/0.6ML IJ SOSY
0.5000 mg/kg | PREFILLED_SYRINGE | INTRAMUSCULAR | Status: DC
  Administered 2023-03-01: 50 mg via SUBCUTANEOUS
  Filled 2023-03-01: qty 0.6

## 2023-03-01 NOTE — Progress Notes (Signed)
   03/01/23 1511  Spiritual Encounters  Type of Visit Initial  Care provided to: Family  Referral source Code page  Reason for visit Trauma  OnCall Visit Yes  Spiritual Framework  Patient Stress Factors Not reviewed  Family Stress Factors Lack of knowledge  Interventions  Spiritual Care Interventions Made Established relationship of care and support;Compassionate presence;Reflective listening  Intervention Outcomes  Outcomes Connection to spiritual care  Spiritual Care Plan  Spiritual Care Issues Still Outstanding Chaplain will continue to follow  Advance Directives (For Healthcare)  Does Patient Have a Medical Advance Directive? No  Would patient like information on creating a medical advance directive? No - Patient declined  Mental Health Advance Directives  Does Patient Have a Mental Health Advance Directive? No  Would patient like information on creating a mental health advance directive? No - Patient declined   Patient came in with Code Stroke and was in CT. Spoke with the Patient Brother in-law and sit with him until patient was place in the room. Patient then was being access by the Medical Team. I let the patient brother know I will return to check on patient and if any other family members are coming.

## 2023-03-01 NOTE — Progress Notes (Signed)
Anticoagulation monitoring(Lovenox):  89yo  M ordered Lovenox 40 mg Q24h    Filed Weights   03/01/23 1510  Weight: 99.8 kg (220 lb)   BMI 32.49   Lab Results  Component Value Date   CREATININE 1.39 (H) 03/01/2023   CREATININE 1.04 07/01/2017   CREATININE 1.02 06/08/2016   Estimated Creatinine Clearance: 43.6 mL/min (A) (by C-G formula based on SCr of 1.39 mg/dL (H)). Hemoglobin & Hematocrit     Component Value Date/Time   HGB 12.2 (L) 03/01/2023 1503   HGB 14.4 07/01/2017 0951   HCT 36.4 (L) 03/01/2023 1503   HCT 43.8 07/01/2017 0951     Per Protocol for Patient with estCrcl > 30 ml/min and BMI > 30, will transition to Lovenox 0.5 mg/kg (50 mg)Q24h      Bari Mantis PharmD Clinical Pharmacist 03/01/2023

## 2023-03-01 NOTE — ED Notes (Signed)
This RN to bedside to introduce pt. Pt is caox4, in no acute distress and ambulatory. Pt stood and walked from ED bed to hospital bed. Pt advised he was playing golf when he noticed the droop in his face after he tried to drink something at the golf [place] and it came right out of his mouth.

## 2023-03-01 NOTE — ED Provider Notes (Signed)
[facility] Provider Note    Event Date/Time   First MD Initiated Contact with Patient 03/01/23 1515     (approximate)   History   Facial Droop   HPI  Jonathan [name] is a 89 y.o. male with a history of hypercholesterolemia, hypertension, prediabetes who presents with difficulty speaking, dizziness.  Patient reports he started golfing at approximately 945.  Around 11:00 he felt dizzy and had some difficulty speaking, on the way home he went to drink some water and the water spilled out the left side of his mouth.  Denies extremity weakness.  No history of the same.     Physical Exam   Triage Vital Signs: ED Triage Vitals  Encounter Vitals Group     BP 03/01/23 1509 (!) 165/84     Systolic BP Percentile --      Diastolic BP Percentile --      Pulse Rate 03/01/23 1509 78     Resp 03/01/23 1509 16     Temp 03/01/23 1509 98.6 F (37 C)     Temp Source 03/01/23 1509 Oral     SpO2 03/01/23 1509 100 %     Weight 03/01/23 1510 99.8 kg (220 lb)     Height 03/01/23 1510 1.753 m (5\' 9" )     Head Circumference --      Peak Flow --      Pain Score 03/01/23 1509 0     Pain Loc --      Pain Education --      Exclude from Growth Chart --     Most recent vital signs: Vitals:   03/01/23 1509  BP: (!) 165/84  Pulse: 78  Resp: 16  Temp: 98.6 F (37 C)  SpO2: 100%     General: Awake, no distress.  CV:  Good peripheral perfusion.  Resp:  Normal effort.  Abd:  No distention.  Other:  Left-sided facial droop noted, PERRLA, EOMI, normal strength in all extremities   ED Results / Procedures / Treatments   Labs (all labs ordered are listed, but only abnormal results are displayed) Labs Reviewed  CBC - Abnormal; Notable for the following components:      Result Value   RBC 3.89 (*)    Hemoglobin 12.2 (*)    HCT 36.4 (*)    All other components within normal limits  COMPREHENSIVE METABOLIC PANEL - Abnormal; Notable for the following components:    Glucose, Bld 115 (*)    BUN 27 (*)    Creatinine, Ser 1.39 (*)    Total Bilirubin 1.4 (*)    GFR, Estimated 49 (*)    All other components within normal limits  CBG MONITORING, ED - Abnormal; Notable for the following components:   Glucose-Capillary 123 (*)    All other components within normal limits  CBG MONITORING, ED - Abnormal; Notable for the following components:   Glucose-Capillary 109 (*)    All other components within normal limits  PROTIME-INR  APTT  DIFFERENTIAL  ETHANOL     EKG  ED ECG REPORT I, Jene Every, the attending physician, personally viewed and interpreted this ECG.  Date: 03/01/2023  Rhythm: normal sinus rhythm QRS Axis: normal Intervals: normal ST/T Wave abnormalities: normal Narrative Interpretation: no evidence of acute ischemia    RADIOLOGY CT without acute abnormality    PROCEDURES:  Critical Care performed: yes  CRITICAL CARE Performed by: Jene Every   Total critical care time: 30 minutes  Critical care  time was exclusive of separately billable procedures and treating other patients.  Critical care was necessary to treat or prevent imminent or life-threatening deterioration.  Critical care was time spent personally by me on the following activities: development of treatment plan with patient and/or surrogate as well as nursing, discussions with consultants, evaluation of patient's response to treatment, examination of patient, obtaining history from patient or surrogate, ordering and performing treatments and interventions, ordering and review of laboratory studies, ordering and review of radiographic studies, pulse oximetry and re-evaluation of patient's condition.   Procedures   MEDICATIONS ORDERED IN ED: Medications - No data to display   IMPRESSION / MDM / ASSESSMENT AND PLAN / ED COURSE  I reviewed the triage vital signs and the nursing notes. Patient's presentation is most consistent with acute presentation with  potential threat to life or bodily function.  Patient presents with dizziness, difficulty speaking, facial droop as noted above.  Time of onset approximately 11 AM although patient is not entirely clear.  Given that patient is within the window for tPA (albeit nearly at the end of the window), code stroke activated  Patient seen by Dr. Otelia Limes, no TPA recommended given NIHSS.  Lab work reviewed and is overall reassuring  Will discuss with the hospitalist for admission      FINAL CLINICAL IMPRESSION(S) / ED DIAGNOSES   Final diagnoses:  Cerebrovascular accident (CVA), unspecified mechanism (HCC)     Rx / DC Orders   ED Discharge Orders     None        Note:  This document was prepared using Dragon voice recognition software and may include unintentional dictation errors.   Jene Every, MD 03/01/23 1620

## 2023-03-01 NOTE — ED Triage Notes (Addendum)
Patient presents with left sided facial droop. Also reports he feels unsteady on feet when standing.   Patient was playing golf today when he stated he started having trouble speaking and noticed his balance was off. He states this started around 10:30am  LKW 10:30am per patient.

## 2023-03-01 NOTE — Progress Notes (Addendum)
Telestroke Note  1522: Code stroke cart activated at this time for patient who presents to the ED with complaints of left facial droop, dizziness, drooling, slurred speech, and difficulty finding words that occurred suddenly while playing golf. Per patient, his friend noted his gait was wobbly. LKW 1030. mRS 0.  1523: Dr. Otelia Limes paged at this time.   1524: Patient left for CT. Delay in CT due to broken scanner.  1530: Called and spoke to Dr. Otelia Limes at this time regarding code stroke alert.   1534: Dr. Otelia Limes in CT.   1535: Dr. Otelia Limes performing neuro assessment. Dr. Otelia Limes also viewed CT imaging results.   1542: Dismissed to log off stroke cart per Dr. Shelbie Hutching request.    Derrill Kay Telestroke RN

## 2023-03-01 NOTE — Progress Notes (Signed)
CODE STROKE- PHARMACY COMMUNICATION   Time CODE STROKE called/page received: 1529  Time response to CODE STROKE was made (in person or via phone): Immediately  Time Stroke Kit retrieved from Py[employer]is (only if needed): N/A, outside of window  Name of Provider/Nurse contacted: Dr. Otelia Limes  Past Medical History:  Diagnosis Date   Cancer [facility])    prostate   Hypercholesteremia    Hypertension    Pre-diabetes    Prior to Admission medications   Medication Sig Start Date End Date Taking? Authorizing Provider  acetaminophen (TYLENOL) 500 MG tablet Take 1 tablet (500 mg total) by mouth every 6 (six) hours as needed. 12/09/15   Plonk, Chrissie Noa, MD  Cholecalciferol (VITAMIN D) 2000 units CAPS Take 1 capsule (2,000 Units total) by mouth daily. 12/09/15   Plonk, Chrissie Noa, MD  Cyanocobalamin (B-12) 500 MCG TABS Take 1 tablet by mouth daily. 07/02/17   Plonk, Chrissie Noa, MD  hydrochlorothiazide (HYDRODIURIL) 25 MG tablet Take 25 mg by mouth daily. 01/11/20   [provider]  lisinopril (ZESTRIL) 20 MG tablet Take 20 mg by mouth daily. 01/11/20   [provider]  simvastatin (ZOCOR) 10 MG tablet Take 10 mg by mouth daily.  12/17/18   [provider]   Tressie Ellis 03/01/2023  4:34 PM

## 2023-03-01 NOTE — ED Notes (Signed)
Activated code stroke through [facility], 1521 per Dr. Cyril Loosen

## 2023-03-01 NOTE — Consult Note (Signed)
NEURO HOSPITALIST CONSULT NOTE   Requesting physician: Dr. Cyril Loosen  Reason for Consult: Acute onset of left facial droop, slurred speech and gait unsteadiness  History obtained from:  Patient and Chart     HPI:                                                                                                                                          Jonathan [name] is an 89 y.o. male with a PMHx of prostate cancer, recent resection/biopsy of cancerous skin lesion in the region of his left perioral skin/lip, hypercholesterolemia, HTN and pre-diabetes who presents to the ED from home via POV for assessment of acute onset of left facial droop, slurred speech and gait unsteadiness. LKN was 1030 when he was playing golf with friends. At that time he noted acute onset of gait unsteadiness. His friend noticed that something was wrong and asked if the patient was ok, then suggested that he stop at 9 holes and rest. While speaking, the patient thought he could perceive a change in the quality of his vocalizations at the level of his throat, but no slurring, comprehension deficit or trouble with word finding. Of note, no alcohol was consumed while golfing, just water. The patient does not report any overheating having occurred while outdoors. The patient then drove himself home and looked in the mirror, noting that overlying the swelling around his left upper lip from the recent biopsy, there seemed to be drooping. There also may have been some drooling on the left. He then drove himself to the ED for evaluation. Code Stroke was called after initial assessment in Triage.   Past Medical History:  Diagnosis Date   Cancer [facility])    prostate   Hypercholesteremia    Hypertension    Pre-diabetes     Past Surgical History:  Procedure Laterality Date   PROSTATE SURGERY     SKIN BIOPSY     basel cell    History reviewed. No pertinent family history.        Father with stroke Mother passed  away with stroke in her 14s    Social History:  reports that he has never smoked. He has never used smokeless tobacco. He reports current alcohol use. He reports that he does not use drugs.  Allergies  Allergen Reactions   Penicillin V Potassium Other (See Comments)   Penicillins Rash    HOME MEDICATIONS:  No current facility-administered medications on file prior to encounter.   Current Outpatient Medications on File Prior to Encounter  Medication Sig Dispense Refill   acetaminophen (TYLENOL) 500 MG tablet Take 1 tablet (500 mg total) by mouth every 6 (six) hours as needed. 30 tablet 0   Cholecalciferol (VITAMIN D) 2000 units CAPS Take 1 capsule (2,000 Units total) by mouth daily. 30 capsule    Cyanocobalamin (B-12) 500 MCG TABS Take 1 tablet by mouth daily. 150 tablet    hydrochlorothiazide (HYDRODIURIL) 25 MG tablet Take 25 mg by mouth daily.     lisinopril (ZESTRIL) 20 MG tablet Take 20 mg by mouth daily.     simvastatin (ZOCOR) 10 MG tablet Take 10 mg by mouth daily.      .   ROS:                                                                                                                                       Denies any limb weakness or limb numbness. No vision changes. Other ROS as per HPI>   Blood pressure (!) 165/84, pulse 78, temperature 98.6 F (37 C), temperature source Oral, resp. rate 16, height 5\' 9"  (1.753 m), weight 99.8 kg, SpO2 100%.   General Examination:                                                                                                       Physical Exam HEENT- [place]/AT. Swelling to left upper > lower lip with healing scabs from recent procedure.  Lungs- Respirations unlabored Extremities- Warm and well-perfused  Neurological Examination Mental Status: Awake and alert. Fully oriented. Thought content appropriate.  Speech fluent  with intact naming and comprehension. Able to follow all commands without difficulty. No dysarthria.  Cranial Nerves: II: Temporal visual fields intact with no extinction to DSS. PERRL. III,IV, VI: No ptosis. EOMI with saccadic visual pursuits noted. No nystagmus. V: Temp sensation equal bilaterally VII: Smile symmetric in the context of left lip swelling.  VIII: Hearing intact to voice IX,X: No hypophonia or hoarseness XI: Symmetric XII: Midline tongue extension Motor: RUE: 5/5 LUE: 5/5 RLE: 5/5 LLE: 5/5 No drift.  Sensory: FT intact x 4. No extinction to DSS. Deep Tendon Reflexes: 2+ and symmetric bilateral biceps, brachioradialis and patellae Plantars: Right: downgoingLeft: downgoing Cerebellar: No ataxia with FNF or H-S bilaterally Gait: Deferred  NIHSS: 0    Lab Results: Basic Metabolic Panel: No results for input(s): "NA", "K", "CL", "CO2", "GLUCOSE", "  BUN", "CREATININE", "CALCIUM", "MG", "PHOS" in the last 168 hours.  CBC: No results for input(s): "WBC", "NEUTROABS", "HGB", "HCT", "MCV", "PLT" in the last 168 hours.  Cardiac Enzymes: No results for input(s): "CKTOTAL", "CKMB", "CKMBINDEX", "TROPONINI" in the last 168 hours.  Lipid Panel: No results for input(s): "CHOL", "TRIG", "HDL", "CHOLHDL", "VLDL", "LDLCALC" in the last 168 hours.  Imaging: No results found.   Assessment: 89 year old male presenting to the ED with acute onset of left facial droop, slurred speech and gait unsteadiness  - Exam reveals no focal neurological deficit. NIHSS 0. The patient states that his symptoms are now nearly resolved.  - CT head: There is no acute intracranial hemorrhage, extra-axial fluid collection, or acute infarct. Parenchymal volume is within expected limits for age. The ventricles are normal in size. Gray-white differentiation is preserved. - EKG: Sinus rhythm with 1st degree A-V block; Right bundle branch block - Labs: - BUN and Cr elevated at 27 and 1.39, respectively.  Estimated GFR 49.  - WBC normal. Platelets normal. Hgb low at 12.2.  - Coags normal - Glucose 115 - EtOH < 10 - DDx for presentation includes transient neurological symptoms due to dehydration/prerenal AKI, and TIA.   Recommendations: 1. HgbA1c, fasting lipid panel 2. MRI/MRA of the brain without contrast 3. PT consult, OT consult, Speech consult 4. TTE 5. Carotid ultrasound 6. Start ASA 81 mg po qd  7. IVF. Monitor BUN and Cr for improvement.  8. Telemetry monitoring 9. Frequent neuro checks 10. NPO until passes stroke swallow screen 11. Modified permissive HTN protocol given advanced age. Treat SBP if > 180. After 24 hours, normalize BP by 20% per day to a goal of 120-140 12. Glycemic control  13. Orthostatics   Electronically signed: Dr. Caryl Pina 03/01/2023, 3:29 PM

## 2023-03-01 NOTE — ED Notes (Signed)
US tech at bedside

## 2023-03-01 NOTE — ED Notes (Signed)
Meal tray delivered.

## 2023-03-01 NOTE — Code Documentation (Signed)
Stroke Response Nurse Documentation Code Documentation  Jonathan [name] is a 89 y.o. male arriving to [facility] via [employer] on 9/6/224 with past medical hx of HTN, hypercholesteremia, pre-diabetes, cancer, recent basal cell removal procedure to left upper lip area. On No antithrombotic. Code stroke was activated by ED.   Patient from home where he was LKW at 1100 and now complaining of slurred speech, facial droop. Patient reports he was out playing golf with friends when he suddenly felt that his balance was off and had trouble speaking. He also endorsed he noticed worsening facial droop at this time. He drove himself home thinking symptoms would resolve and came to the ED when symptoms did not. LKW initially reported as 1030. Upon further discussion with provider, LKW was determined to be 1100 and code stroke was activated.   Stroke team at the bedside on patient arrival. Labs drawn and patient cleared for CT by Dr. Cyril [name]. Patient to CT with team. NIHSS 1, see documentation for details and code stroke times. Patient with left facial droop on exam. The following imaging was completed:  CT Head. Patient is not a candidate for IV Thrombolytic due to outside window, per MD. Patient is not a candidate for IR due to exam not consistent with LVO, per MD.   Care Plan: every 2 hour NIHSS and vital sings, swallow screen per protocol.   Bedside handoff with ED RN Jonathan Batten and Jonathan [name].    Jonathan [name]  Stroke Response RN

## 2023-03-01 NOTE — ED Notes (Addendum)
Patient to 1H; reports last known normal at 1030, had sudden onset of left facial droop, slurred speech and drooling. Reports slurred speech has resolved. MD Cyril Loosen made aware.

## 2023-03-01 NOTE — ED Notes (Signed)
Pt needing to use the restroom. Pt provided with a urinal. Pt ate 100% of his dinner.

## 2023-03-01 NOTE — H&P (Signed)
History and Physical    Patient: Jonathan [name] WJX:[id number] DOB: Jul 05, 1933 DOA: 03/01/2023 DOS: the patient was seen and examined on 03/01/2023 PCP: Marina Goodell, MD  Patient coming from: Home - lives with wife; NOK: Wife, Wells Moring, [phone]   Chief Complaint: Stroke-like symptoms  HPI: Jonathan [name] is a 89 y.o. male with medical history significant of prostate CA, HTN, HLD, and pre-diabetes presenting with stroke-like symptoms.  He reports that he was playing golf this AM and acutely developed some difficulty with speech.  He knew what he wanted to say but was slurring.  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  He went home and started eating some crackers and drinking and was drooling and couldn't keep them in his mouth so he came to the ER.  The speech symptoms have resolved but he is unsure about the facial droop.  He did feel unbalanced during the episode but not frankly weak on either side; this has also resolved.       ER Course:  Golfing this AM.  About 11, got dizzy with difficulty speaking.  Drank home, spilled water when trying to drink.  L facial droop.  Called as code stroke.  Neurology consulted, no TNK.  Head CT negative.  Needs stroke work-up.     Review of Systems: As mentioned in the history of present illness. All other systems reviewed and are negative. Past Medical History:  Diagnosis Date   Cancer [facility])    prostate   Hypercholesteremia    Hypertension    Pre-diabetes    Past Surgical History:  Procedure Laterality Date   PROSTATE SURGERY     SKIN BIOPSY     basel cell   Social History:  reports that he has never smoked. He has never used smokeless tobacco. He reports current alcohol use. He reports that he does not use drugs.  Allergies  Allergen Reactions   Penicillin V Potassium Other (See Comments)   Penicillins Rash    History reviewed. No pertinent  family history.  Prior to Admission medications   Medication Sig Start Date End Date Taking? Authorizing Provider  acetaminophen (TYLENOL) 500 MG tablet Take 1 tablet (500 mg total) by mouth every 6 (six) hours as needed. 12/09/15   Plonk, Chrissie Noa, MD  Cholecalciferol (VITAMIN D) 2000 units CAPS Take 1 capsule (2,000 Units total) by mouth daily. 12/09/15   Plonk, Chrissie Noa, MD  Cyanocobalamin (B-12) 500 MCG TABS Take 1 tablet by mouth daily. 07/02/17   Plonk, Chrissie Noa, MD  hydrochlorothiazide (HYDRODIURIL) 25 MG tablet Take 25 mg by mouth daily. 01/11/20   [provider]  lisinopril (ZESTRIL) 20 MG tablet Take 20 mg by mouth daily. 01/11/20   [provider]  simvastatin (ZOCOR) 10 MG tablet Take 10 mg by mouth daily.  12/17/18   [provider]    Physical Exam: Vitals:   03/01/23 1509 03/01/23 1510 03/01/23 1815  BP: (!) 165/84  130/74  Pulse: 78  69  Resp: 16  20  Temp: 98.6 F (37 C)  98.2 F (36.8 C)  TempSrc: Oral  Oral  SpO2: 100%  100%  Weight:  99.8 kg   Height:  5\' 9"  (1.753 m)    General:  Appears calm and comfortable and is in NAD Eyes:  PERRL, EOMI, normal lids, iris ENT:  grossly normal hearing, lips & tongue, mmm Neck:  no LAD, masses or thyromegaly Cardiovascular:  RRR, no m/r/g. No LE edema.  Respiratory:   CTA bilaterally with no wheezes/rales/rhonchi.  Normal respiratory effort. Abdomen:  soft, NT, ND Skin:  no rash or induration seen on limited exam Musculoskeletal:  grossly normal tone BUE/BLE, good ROM, no bony abnormality Psychiatric:  grossly normal mood and affect, speech fluent and appropriate, AOx3 Neurologic:  CN 2-12 grossly intact other than subtle L facial droop, moves all extremities in coordinated fashion, sensation intact   Radiological Exams on Admission: Independently reviewed - see discussion in A/P where applicable  CT HEAD CODE STROKE WO CONTRAST  Addendum Date: 03/01/2023   ADDENDUM REPORT: 03/01/2023 15:46 ADDENDUM:  Findings communicated to Dr Otelia Limes via Loretha Stapler at 3:40 pm. Electronically Signed   By: Lesia Hausen M.D.   On: 03/01/2023 15:46   Result Date: 03/01/2023 CLINICAL DATA:  Code stroke. EXAM: CT HEAD WITHOUT CONTRAST TECHNIQUE: Contiguous axial images were obtained from the base of the skull through the vertex without intravenous contrast. RADIATION DOSE REDUCTION: This exam was performed according to the departmental dose-optimization program which includes automated exposure control, adjustment of the mA and/or kV according to patient size and/or use of iterative reconstruction technique. COMPARISON:  None Available. FINDINGS: Brain: There is no acute intracranial hemorrhage, extra-axial fluid collection, or acute infarct Parenchymal volume is within expected limits for age. The ventricles are normal in size. Gray-white differentiation is preserved An empty sella is noted, nonspecific. The suprasellar region is normal. There is no mass lesion. There is no mass effect or midline shift. Vascular: No hyperdense vessel or unexpected calcification. Skull: Normal. Negative for fracture or focal lesion. Sinuses/Orbits: The imaged paranasal sinuses are clear. Bilateral lens implants are in place. The globes and orbits are otherwise unremarkable. Other: The mastoid air cells and middle ear cavities are clear. ASPECTS [facility] Stroke Program Early CT Score) - Ganglionic level infarction (caudate, lentiform nuclei, internal capsule, insula, M1-M3 cortex): 7 - Supraganglionic infarction (M4-M6 cortex): 3 Total score (0-10 with 10 being normal): 10 IMPRESSION: No acute intracranial pathology. Electronically Signed: By: Lesia Hausen M.D. On: 03/01/2023 15:41    EKG: Independently reviewed.  NSR with rate 72; RBBB with no evidence of acute ischemia   Labs on Admission: I have personally reviewed the available labs and imaging studies at the time of the admission.  Pertinent labs:    Glucose 115 BUN 27/Creatinine 1.39/GFR  49; stable from 01/25/23 at [facility] 1.4 Unremarkable CBC ETOH < 10 Recent PSA WNL   Assessment and Plan: Principal Problem:   Stroke-like symptoms Active Problems:   Benign hypertension   Hyperlipidemia   Prediabetes   Obesity (BMI 30.0-34.9)   History of prostate cancer   Chronic kidney disease, stage 3a (HCC)    Stroke-like symptoms -Concerning for TIA/CVA -Aspirin has been given to reduce stroke mortality and decrease morbidity -Will place in observation status for CVA/TIA evaluation -Telemetry monitoring -MRI/MRA -Carotid dopplers -Echo -Patient will need DAPT for 21 days when ABCD2 score is at least 4 and NIH score is 3 or less, and then can transition to monotherapy with a single antiplatelet agent.  Will defer to neurology for now. -Neurology consult -PT/OT/ST/Nutrition Consults  HTN -Allow permissive HTN for now -Treat BP only if >220/120, and then with goal of 15% reduction -Hold hydrochlorothiazide, lisinopril and plan to restart in 48-72 hours   HLD -8/2 lipid profile: 174/47/95/160 -Resume statin but will change simvastatin to Lipitor 40 mg daily   DM -Recent  A1c was 6.1, good control -No home meds -He should not need SSI at this time   Prostate CA -Recent normal PSA -Continues in remission  Stage 3a CKD -Appears to be stable at this time -Attempt to avoid nephrotoxic medications -Recheck BMP in AM   Obesity -Body mass index is 32.49 kg/m..  -Weight loss should be encouraged -Outpatient PCP/bariatric medicine f/u encouraged    Advance Care Planning:   Code Status: Full Code - Code status was discussed with the patient and/or family at the time of admission.  The patient would want to receive full resuscitative measures at this time.   Consults: Neurology; PT/OT/SLP; Nutrition  DVT Prophylaxis: Lovenox  Family Communication: None present; he declined to have me call his wife at the time of admission  Severity of Illness: The appropriate  patient status for this patient is OBSERVATION. Observation status is judged to be reasonable and necessary in order to provide the required intensity of service to ensure the patient's safety. The patient's presenting symptoms, physical exam findings, and initial radiographic and laboratory data in the context of their medical condition is felt to place them at decreased risk for further clinical deterioration. Furthermore, it is anticipated that the patient will be medically stable for discharge from the hospital within 2 midnights of admission.   Author: Jonah Blue, MD 03/01/2023 7:05 PM  For on call review [email or web address].

## 2023-03-02 ENCOUNTER — Observation Stay (HOSPITAL_BASED_OUTPATIENT_CLINIC_OR_DEPARTMENT_OTHER)
Admit: 2023-03-02 | Discharge: 2023-03-02 | Disposition: A | Discharge status: Home / Self Care | Payer: Medicare HMO | Attending: Internal Medicine | Admitting: Internal Medicine

## 2023-03-02 DIAGNOSIS — R2990 Unspecified symptoms and signs involving the nervous system: Secondary | ICD-10-CM

## 2023-03-02 DIAGNOSIS — I6389 Other cerebral infarction: Secondary | ICD-10-CM | POA: Diagnosis not present

## 2023-03-02 DIAGNOSIS — I639 Cerebral infarction, unspecified: Secondary | ICD-10-CM | POA: Diagnosis not present

## 2023-03-02 DIAGNOSIS — I4891 Unspecified atrial fibrillation: Secondary | ICD-10-CM | POA: Diagnosis not present

## 2023-03-02 LAB — CK: Total CK: 91 U/L (ref 49–397)

## 2023-03-02 LAB — LIPID PANEL
Cholesterol: 179 mg/dL (ref 0–200)
HDL: 46 mg/dL (ref >40)
LDL Cholesterol: 100 mg/dL — ABNORMAL HIGH (ref 0–99)
Total CHOL/HDL Ratio: 3.9 ratio
Triglycerides: 167 mg/dL — ABNORMAL HIGH (ref <150)
VLDL: 33 mg/dL (ref 0–40)

## 2023-03-02 LAB — ECHOCARDIOGRAM COMPLETE
AR max vel: 2.38 cm2
AV Area VTI: 2.5 cm2
AV Area mean vel: 2.37 cm2
AV Mean grad: 4 mmHg
AV Peak grad: 6.7 mmHg
Ao pk vel: 1.29 m/s
Area-P 1/2: 3.65 cm2
Height: 69 in
P 1/2 time: 528 ms
S' Lateral: 2.6 cm
Weight: 3520.31 oz

## 2023-03-02 LAB — GLUCOSE, CAPILLARY
Glucose-Capillary: 100 mg/dL — ABNORMAL HIGH (ref 70–99)
Glucose-Capillary: 87 mg/dL (ref 70–99)
Glucose-Capillary: 103 mg/dL — ABNORMAL HIGH (ref 70–99)

## 2023-03-02 MED ORDER — ATORVASTATIN CALCIUM 40 MG PO TABS: 40.0000 mg | ORAL_TABLET | Freq: Every day | ORAL | 0 refills | Status: DC

## 2023-03-02 MED ORDER — APIXABAN 5 MG PO TABS
5.0000 mg | ORAL_TABLET | Freq: Two times a day (BID) | ORAL | Status: DC
  Administered 2023-03-02: 5 mg via ORAL
  Filled 2023-03-02: qty 1

## 2023-03-02 MED ORDER — CLOPIDOGREL BISULFATE 75 MG PO TABS
75.0000 mg | ORAL_TABLET | Freq: Every day | ORAL | Status: DC
  Administered 2023-03-02: 75 mg via ORAL
  Filled 2023-03-02: qty 1

## 2023-03-02 MED ORDER — APIXABAN 5 MG PO TABS: 5.0000 mg | ORAL_TABLET | Freq: Two times a day (BID) | ORAL | 0 refills | Status: DC

## 2023-03-02 MED ORDER — APIXABAN 5 MG PO TABS: 5.0000 mg | ORAL_TABLET | Freq: Two times a day (BID) | ORAL | Status: DC

## 2023-03-02 MED ORDER — CARVEDILOL 3.125 MG PO TABS: 3.1250 mg | ORAL_TABLET | Freq: Two times a day (BID) | ORAL | 11 refills | Status: DC

## 2023-03-02 NOTE — Progress Notes (Signed)
SLP Cancellation Note  Patient Details Name: Jonathan [name] MRN: [id number] DOB: 1933/08/16   Cancelled treatment:       Reason Eval/Treat Not Completed: SLP screened, no needs identified, will sign off   Teneisha Gignac 03/02/2023, 10:09 AM

## 2023-03-02 NOTE — Care Management Obs Status (Signed)
MEDICARE OBSERVATION STATUS NOTIFICATION   Patient Details  Name: Jonathan [name] MRN: [id number] Date of Birth: 28-Sep-1933   Medicare Observation Status Notification Given:  Yes    Kyelle Urbas E Alya Smaltz, LCSW 03/02/2023, 3:41 PM

## 2023-03-02 NOTE — Evaluation (Signed)
Occupational Therapy Evaluation Patient Details Name: Jonathan [name] MRN: [id number] DOB: 08-21-1933 Today's Date: 03/02/2023   History of Present Illness Patient is a 89 year old male presenting with stroke-like symptoms while playing golf. History of prostate CA, HTN, HLD, and pre-diabetes. MRI of brain reports 9 mm acute ischemic infarct involving the superior right insula. Small focus of associated petechial blood products without frank hemorrhagic transformation or mass effect.   Clinical Impression   Jonathan [name] presents 1 day after experiencing stroke-like symptoms on the golf course. Pt has been IND in functional mobility: driving, doing yard work, playing golf (18 holes, using cart) 2x/week, ambulating without any assistive device and with no falls history. During today's evaluation, pt is pleasant and conversational, alert and oriented, and reports he is back to his baseline level. He is able to perform bed mobility, transfers, ambulation fluidly and w/o assistance. No focal weaknesses noted. Pt does not need any further OT services at this time. Provided educ re: the importance of seeking medical attention immediately following onset of any stroke-like symptoms, with pt verbalizing that he understands.       If plan is discharge home, recommend the following:      Functional Status Assessment  Patient has not had a recent decline in their functional status  Equipment Recommendations  None recommended by OT    Recommendations for Other Services       Precautions / Restrictions Precautions Precautions: None Precaution Comments: mod fall risk Restrictions Weight Bearing Restrictions: No      Mobility Bed Mobility Overal bed mobility: Modified Independent                  Transfers Overall transfer level: Modified independent                        Balance Overall balance assessment: Needs assistance Sitting-balance support: Feet supported Sitting  balance-Leahy Scale: Good     Standing balance support: No upper extremity supported, Single extremity supported Standing balance-Leahy Scale: Good Standing balance comment: Pt does use occasional support in standing (e.g., 1 hand on countertop), but overall standing balance is good. No falls history.                           ADL either performed or assessed with clinical judgement   ADL Overall ADL's : Modified independent;At baseline                                       General ADL Comments: Pt is able to perform grooming, dressing, toileting, showering INDly. Does need to have some stabilizing support for threading pant legs, but this is his norm.     Vision Patient Visual Report: No change from baseline       Perception         Praxis         Pertinent Vitals/Pain Pain Assessment Pain Assessment: No/denies pain     Extremity/Trunk Assessment Upper Extremity Assessment Upper Extremity Assessment: Overall WFL for tasks assessed   Lower Extremity Assessment Lower Extremity Assessment: Overall WFL for tasks assessed RLE Deficits / Details: 5/5 knee extension RLE Sensation: WNL LLE Deficits / Details: 5/5 knee extension LLE Sensation: WNL       Communication Communication Communication: No apparent difficulties   Cognition Arousal: Alert Behavior During Therapy: [facility]  for tasks assessed/performed Overall Cognitive Status: Within Functional Limits for tasks assessed                                       General Comments       Exercises Other Exercises Other Exercises: Educ re: importance of seeking medical attn at first sign of any stroke-like symptoms   Shoulder Instructions      Home Living Family/patient expects to be discharged to:: Private residence Living Arrangements: Spouse/significant other Available Help at Discharge: Family Type of Home: House Home Access: Stairs to enter Secretary/administrator of  Steps: 5 Entrance Stairs-Rails: Right;Left Home Layout: One level               Home Equipment: Grab bars - tub/shower;Grab bars - toilet          Prior Functioning/Environment Prior Level of Function : Independent/Modified Independent;Driving             Mobility Comments: enjoys playing golf twice a week. independent at baseline without assistive device ADLs Comments: independent        OT Problem List: Impaired balance (sitting and/or standing)      OT Treatment/Interventions:      OT Goals(Current goals can be found in the care plan section) Acute Rehab OT Goals Patient Stated Goal: to go golfing 2x/week OT Goal Formulation: With patient Time For Goal Achievement: 03/16/23 Potential to Achieve Goals: Good  OT Frequency:      Co-evaluation              AM-PAC OT "6 Clicks" Daily Activity     Outcome Measure Help from another person eating meals?: None Help from another person taking care of personal grooming?: None Help from another person toileting, which includes using toliet, bedpan, or urinal?: None Help from another person bathing (including washing, rinsing, drying)?: None Help from another person to put on and taking off regular upper body clothing?: None Help from another person to put on and taking off regular lower body clothing?: None 6 Click Score: 24   End of Session    Activity Tolerance: Patient tolerated treatment well Patient left: Other (comment) (standing in room, with speech therapist present)  OT Visit Diagnosis: Other (comment);Unsteadiness on feet (R26.81) (stroke-like symptoms)                Time: 1610-9604 OT Time Calculation (min): 17 min Charges:  OT General Charges $OT Visit: 1 Visit OT Evaluation $OT Eval Low Complexity: 1 Low Latina Craver, PhD, MS, OTR/L 03/02/23, 10:48 AM

## 2023-03-02 NOTE — Evaluation (Signed)
Physical Therapy Evaluation Patient Details Name: Enedino Corio MRN: [id number] DOB: [birth year] Today's Date: 03/02/2023  History of Present Illness  Patient is a 89 year old male presenting with stroke-like symptoms while playing golf. History of prostate CA, HTN, HLD, and pre-diabetes. MRI of brain reports 9 mm acute ischemic infarct involving the superior right insula. Small focus of associated petechial blood products without frank hemorrhagic transformation or mass effect.   Clinical Impression  Patient is agreeable to PT evaluation. He is independent with ambulation at baseline without assistive device and enjoys playing golf several times a week. He lives with his spouse in a single story home with 5 steps to enter.   No focal weakness is noted today. Patient is Mod I with  bed mobility and transfers. Supervision provided for hallway ambulation with mild anterior-posterior trunk sway with sudden stops. Intermittent use of hallway railing towards end of walking bout. The patient reports he feels back to baseline but does endorse balance issues that have been going on for a while. Recommend PT follow up while in the hospital to maximize independence and decrease risk for falls.       If plan is discharge home, recommend the following: Assist for transportation   Can travel by private vehicle        Equipment Recommendations None recommended by PT  Recommendations for Other Services       Functional Status Assessment Patient has had a recent decline in their functional status and demonstrates the ability to make significant improvements in function in a reasonable and predictable amount of time.     Precautions / Restrictions Precautions Precautions: Fall Restrictions Weight Bearing Restrictions: No      Mobility  Bed Mobility Overal bed mobility: Modified Independent             General bed mobility comments: head of bed elevated    Transfers Overall transfer  level: Modified independent                      Ambulation/Gait Ambulation/Gait assistance: Supervision Gait Distance (Feet): 175 Feet Assistive device:  (intermittent use of hallway railing) Gait Pattern/deviations: Step-through pattern Gait velocity: decreased     General Gait Details: mild anterior-posterior trunk sway with suddent stops. no gross loss of balance. intermittent use of hallway railing support.  Stairs            Wheelchair Mobility     Tilt Bed    Modified Rankin (Stroke Patients Only)       Balance Overall balance assessment: Needs assistance Sitting-balance support: Feet supported Sitting balance-Leahy Scale: Good     Standing balance support: No upper extremity supported Standing balance-Leahy Scale: Fair               High level balance activites: Sudden stops, Turns, Head turns High Level Balance Comments: mild anterior-posterior trunk sway with sudden stops. with dual tasking of head turns while ambulating, patient prefers to come to complete stop. he reports balance has been declining for some time now             Pertinent Vitals/Pain Pain Assessment Pain Assessment: No/denies pain    Home Living Family/patient expects to be discharged to:: Private residence Living Arrangements: Spouse/significant other Available Help at Discharge: Family Type of Home: House Home Access: Stairs to enter Entrance Stairs-Rails: Doctor, general practice of Steps: 5   Home Layout: One level Home Equipment: Grab bars - tub/shower;Grab bars - toilet  Prior Function Prior Level of Function : Independent/Modified Independent;Driving             Mobility Comments: enjoys playing golf twice a week. independent at baseline without assistive device ADLs Comments: independent     Extremity/Trunk Assessment   Upper Extremity Assessment Upper Extremity Assessment: Overall WFL for tasks assessed    Lower Extremity  Assessment Lower Extremity Assessment: RLE deficits/detail;LLE deficits/detail RLE Deficits / Details: 5/5 knee extension RLE Sensation: WNL LLE Deficits / Details: 5/5 knee extension LLE Sensation: WNL       Communication   Communication Communication: No apparent difficulties  Cognition Arousal: Alert Behavior During Therapy: WFL for tasks assessed/performed Overall Cognitive Status: Within Functional Limits for tasks assessed                                          General Comments      Exercises     Assessment/Plan    PT Assessment Patient needs continued PT services  PT Problem List Decreased balance;Decreased mobility;Decreased activity tolerance       PT Treatment Interventions DME instruction;Gait training;Stair training;Functional mobility training;Therapeutic activities;Therapeutic exercise;Balance training;Neuromuscular re-education;Patient/family education;Cognitive remediation    PT Goals (Current goals can be found in the Care Plan section)  Acute Rehab PT Goals Patient Stated Goal: to be discharged today PT Goal Formulation: With patient Time For Goal Achievement: 03/16/23 Potential to Achieve Goals: Good Additional Goals Additional Goal #1: Patient is able to maintain balance without support, accepts moderate challenge and shift weight during functional tasks without UE support to minimize risk for falls, Mod I    Frequency Min 1X/week     Co-evaluation               AM-PAC PT "6 Clicks" Mobility  Outcome Measure Help needed turning from your back to your side while in a flat bed without using bedrails?: None Help needed moving from lying on your back to sitting on the side of a flat bed without using bedrails?: None Help needed moving to and from a bed to a chair (including a wheelchair)?: None Help needed standing up from a chair using your arms (e.g., wheelchair or bedside chair)?: None Help needed to walk in hospital  room?: A Little Help needed climbing 3-5 steps with a railing? : A Little 6 Click Score: 22    End of Session   Activity Tolerance: Patient tolerated treatment well Patient left: in bed;with call bell/phone within reach;with family/visitor present (nephew present) Nurse Communication: Mobility status PT Visit Diagnosis: Unsteadiness on feet (R26.81);Muscle weakness (generalized) (M62.81)    Time: 1191-4782 PT Time Calculation (min) (ACUTE ONLY): 20 min   Charges:   PT Evaluation $PT Eval Low Complexity: 1 Low   PT General Charges $$ ACUTE PT VISIT: 1 Visit         Donna Bernard, PT, MPT  Ina Homes 03/02/2023, 9:04 AM

## 2023-03-02 NOTE — Progress Notes (Signed)
*  PRELIMINARY RESULTS* Echocardiogram 2D Echocardiogram has been performed.  Laddie Aquas 03/02/2023, 12:38 PM

## 2023-03-02 NOTE — Hospital Course (Addendum)
Jonathan [name] is a 89 y.o. male with medical history significant of prostate CA, HTN, HLD, and pre-diabetes presenting with stroke-like symptoms.  He reports that he was playing golf this AM and acutely developed some difficulty with speech.  He knew what he wanted to say but was slurring.  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  He went home and started eating some crackers and drinking and was drooling and couldn't keep them in his mouth so he came to the ER.  The speech symptoms have resolved but he is unsure about the facial droop.  He did feel unbalanced during the episode but not frankly weak on either side; this has also resolved.    09/06: to ED. Code stroke called. No TNK. CT head neg. Admitted to hospitalist service. MRI brain demonstrating 9 mm acute ischemic infarct involving the superior right insula. 09/07: Echo no concerns.   Consultants:  neurology  Procedures: none      ASSESSMENT & PLAN:   Principal Problem:   Stroke-like symptoms Active Problems:   Benign hypertension   Hyperlipidemia   Prediabetes   Obesity (BMI 30.0-34.9)   History of prostate cancer   Chronic kidney disease, stage 3a (HCC)  CVA w/ MRI demonstrating 9 mm acute ischemic infarct involving the superior right insula. Telemetry --> no concerns MRI/MRA --> see full report below, (+) "9 mm acute ischemic infarct involving the superior right insula.Small focus of associated petechial blood products without frank hemorrhagic transformation or mass effect. Underlying age-related cerebral atrophy, chronic small vessel ischemic dz. MRA head  and MRA neck negative for large vessel occlusion / correctable stenosis" Carotid dopplers --> (+)atherosclerosis w/o significant stenosis  Echo --> no concerns  DAPT for 21 days when ABCD2 score is at least 4 and NIH score is 3 or less, and then can transition to monotherapy with a  single antiplatelet agent.  Will defer to neurology for now. Neurology consult --> *** PT/OT/ST/Nutrition Consults --> no concerns  A1C --> see below Lipids --> see below    HTN Allow permissive HTN for now Treat BP only if >220/120, and then with goal of 15% reduction Hold hydrochlorothiazide, lisinopril and plan to restart in 48-72 hours   HLD 8/2 lipid profile: 174/47/95/160 Resume statin but will change simvastatin to Lipitor 40 mg daily   DM Recent A1c was 6.1, good control No home meds He should not need SSI at this time   Stage 3a CKD Appears to be stable at this time avoid nephrotoxic medications Monitor BMP    Obesity Body mass index is 32.49 kg/m.Marland Kitchen  Encourage weight loss to mitigate effects / risk factors associated w/ DM2, HLD, HTN,TIA  Hx Prostate CA Recent normal PSA Continues in remission Follow outpatient     DVT prophylaxis: lovenox  IV fluids: stopped continuous IV fluids  Nutrition: cardiac/carb diet  Central lines / invasive devices: none  Code Status: FULL CODE ACP documentation reviewed: 03/02/23 none on file in [facility]  Current Admission Status: observation   LOS: 0 TOC needs: *** Barriers to discharge / significant pending items: ***

## 2023-03-02 NOTE — Plan of Care (Signed)
  Problem: Education: Goal: Ability to describe self-care measures that may prevent or decrease complications (Diabetes Survival Skills Education) will improve Outcome: Completed/Met Goal: Individualized Educational Video(s) Outcome: Completed/Met   Problem: Coping: Goal: Ability to adjust to condition or change in health will improve Outcome: Completed/Met   Problem: Fluid Volume: Goal: Ability to maintain a balanced intake and output will improve Outcome: Completed/Met   Problem: Health Behavior/Discharge Planning: Goal: Ability to identify and utilize available resources and services will improve Outcome: Completed/Met Goal: Ability to manage health-related needs will improve Outcome: Completed/Met   Problem: Metabolic: Goal: Ability to maintain appropriate glucose levels will improve Outcome: Completed/Met   Problem: Nutritional: Goal: Maintenance of adequate nutrition will improve Outcome: Completed/Met Goal: Progress toward achieving an optimal weight will improve Outcome: Completed/Met   Problem: Skin Integrity: Goal: Risk for impaired skin integrity will decrease Outcome: Completed/Met   Problem: Tissue Perfusion: Goal: Adequacy of tissue perfusion will improve Outcome: Completed/Met   Problem: Education: Goal: Knowledge of disease or condition will improve Outcome: Completed/Met Goal: Knowledge of secondary prevention will improve (MUST DOCUMENT ALL) Outcome: Completed/Met Goal: Knowledge of patient specific risk factors will improve Loraine Leriche N/A or DELETE if not current risk factor) Outcome: Completed/Met   Problem: Ischemic Stroke/TIA Tissue Perfusion: Goal: Complications of ischemic stroke/TIA will be minimized Outcome: Completed/Met   Problem: Coping: Goal: Will verbalize positive feelings about self Outcome: Completed/Met Goal: Will identify appropriate support needs Outcome: Completed/Met   Problem: Health Behavior/Discharge Planning: Goal: Ability  to manage health-related needs will improve Outcome: Completed/Met Goal: Goals will be collaboratively established with patient/family Outcome: Completed/Met   Problem: Self-Care: Goal: Ability to participate in self-care as condition permits will improve Outcome: Completed/Met Goal: Verbalization of feelings and concerns over difficulty with self-care will improve Outcome: Completed/Met Goal: Ability to communicate needs accurately will improve Outcome: Completed/Met   Problem: Nutrition: Goal: Risk of aspiration will decrease Outcome: Completed/Met Goal: Dietary intake will improve Outcome: Completed/Met   Problem: Education: Goal: Knowledge of General Education information will improve Description: Including pain rating scale, medication(s)/side effects and non-pharmacologic comfort measures Outcome: Completed/Met   Problem: Education: Goal: Knowledge of disease or condition will improve Outcome: Completed/Met Goal: Knowledge of secondary prevention will improve (MUST DOCUMENT ALL) Outcome: Completed/Met Goal: Knowledge of patient specific risk factors will improve Loraine Leriche N/A or DELETE if not current risk factor) Outcome: Completed/Met   Problem: Ischemic Stroke/TIA Tissue Perfusion: Goal: Complications of ischemic stroke/TIA will be minimized Outcome: Completed/Met   Problem: Coping: Goal: Will identify appropriate support needs Outcome: Completed/Met   Problem: Health Behavior/Discharge Planning: Goal: Ability to manage health-related needs will improve Outcome: Completed/Met Goal: Goals will be collaboratively established with patient/family Outcome: Completed/Met   Problem: Self-Care: Goal: Ability to participate in self-care as condition permits will improve Outcome: Completed/Met Goal: Ability to communicate needs accurately will improve Outcome: Completed/Met   Problem: Nutrition: Goal: Risk of aspiration will decrease Outcome: Completed/Met

## 2023-03-02 NOTE — TOC Initial Note (Signed)
Transition of Care [facility]) - Initial/Assessment Note    Patient Details  Name: Jonathan [name] MRN: [id number] Date of Birth: [birth year]  [facility]) CM/SW Contact:    Liliana Cline, LCSW Phone Number: 03/02/2023, 3:42 PM  Clinical Narrative:                 Met with patient at bedside. Patient is from home with his wife. Patient drives. PCP is Dr. Maryjane Hurter. [facility]  Explained PT rec for OPPT. Patient declines, stating he does not feel he needs it after working with Therapy this morning. Patient states he plans to go to the gym independently.  Patient denies additional needs.   Expected Discharge Plan: Home/Self Care Barriers to Discharge: Barriers Resolved   Patient Goals and CMS Choice   CMS Medicare.gov Compare Post Acute Care list provided to:: Patient Choice offered to / list presented to : Patient      Expected Discharge Plan and Services       Living arrangements for the past 2 months: Single Family Home                                      Prior Living Arrangements/Services Living arrangements for the past 2 months: Single Family Home Lives with:: Spouse Patient language and need for interpreter reviewed:: Yes Do you feel safe going back to the place where you live?: Yes      Need for Family Participation in Patient Care: Yes (Comment) Care giver support system in place?: Yes (comment) Current home services: DME Criminal Activity/Legal Involvement Pertinent to Current Situation/Hospitalization: No - Comment as needed  Activities of Daily Living Home Assistive Devices/Equipment: None ADL Screening (condition at time of admission) Patient's cognitive ability adequate to safely complete daily activities?: Yes Is the patient deaf or have difficulty hearing?: No Does the patient have difficulty seeing, even when wearing glasses/contacts?: No Does the patient have difficulty concentrating, remembering, or making decisions?:  No Patient able to express need for assistance with ADLs?: No Does the patient have difficulty dressing or bathing?: No Independently performs ADLs?: Yes (appropriate for developmental age) Does the patient have difficulty walking or climbing stairs?: No Weakness of Legs: None Weakness of Arms/Hands: None  Permission Sought/Granted                  Emotional Assessment       Orientation: : Oriented to Self, Oriented to Place, Oriented to  Time, Oriented to Situation Alcohol / Substance Use: Not Applicable Psych Involvement: No (comment)  Admission diagnosis:  Stroke-like symptoms [R29.90] Cerebrovascular accident (CVA), unspecified mechanism (HCC) [I63.9] Patient Active Problem List   Diagnosis Date Noted   Stroke-like symptoms 03/01/2023   Chronic kidney disease, stage 3a (HCC) 03/01/2023   Urinary frequency 01/22/2019   B12 deficiency 07/02/2017   Varicose veins of leg with edema, right 07/01/2017   Osteoarthritis 06/08/2016   History of prostate cancer 12/09/2015   Vitamin D deficiency 12/09/2015   Gait instability 12/07/2015   Obesity (BMI 30.0-34.9) 12/07/2015   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 [place]  [address] Phone: [phone] Fax: [phone]  [id number] [place] [place] - [address] [place] [address] Phone: [phone] Fax: [phone]     Social Determinants of Health (SDOH) Social History: SDOH Screenings   Food Insecurity: No Food Insecurity (03/02/2023)  Housing: Low Risk  (03/02/2023)  Transportation Needs: No Transportation Needs (03/02/2023)  Utilities: Not At Risk (03/02/2023)  Financial Resource Strain: Low Risk   (01/25/2023)   Received from [facility]  Tobacco Use: Low Risk  (03/01/2023)  Recent Concern: Tobacco Use - Medium Risk (01/25/2023)   Received from [facility]   SDOH Interventions:     Readmission Risk Interventions     No data to display

## 2023-03-02 NOTE — Progress Notes (Signed)
Subjective: States that his symptoms are completely resolved. MRI revealed a small acute lacunar stroke in the right insula.   Objective: Current vital signs: BP (!) 143/72 (BP Location: Left Arm)   Pulse (!) 57   Temp 98 F (36.7 C) (Oral)   Resp 16   Ht 5\' 9"  (1.753 m)   Wt 99.8 kg   SpO2 100%   BMI 32.49 kg/m  Vital signs in last 24 hours: Temp:  [97.5 F (36.4 C)-98.6 F (37 C)] 98 F (36.7 C) (09/07 1306) Pulse Rate:  [56-78] 57 (09/07 1306) Resp:  [16-20] 16 (09/07 1306) BP: (120-165)/(62-84) 143/72 (09/07 1306) SpO2:  [95 %-100 %] 100 % (09/07 1306) Weight:  [99.8 kg] 99.8 kg (09/06 2034)  Intake/Output from previous day: 09/06 0701 - 09/07 0700 In: 408.9 [I.V.:408.9] Out: 600 [Urine:600] Intake/Output this shift: Total I/O In: 120 [P.O.:120] Out: -  Nutritional status:  Diet Order             Diet heart healthy/carb modified Room service appropriate? Yes; Fluid consistency: Thin  Diet effective now                   Physical Exam HEENT- [place]/AT   Lungs- Respirations unlabored Extremities- Warm and well-perfused  Neurological Examination Mental Status: Awake and alert. Fully oriented. Thought content appropriate.  Speech fluent without evidence of aphasia.  Able to follow all commands without difficulty. Cranial Nerves: II: Pupils equal.  III,IV, VI: EOMI. Saccadic quality of visual pursuits is noted.  VII: Smile symmetric, taking into account left lip swelling VIII: Hearing intact to voice IX,X: No hypophonia or hoarseness XI: Symmetric Motor: RUE: 5/5 LUE: 5/5 RLE: 5/5 LLE: 5/5 Cerebellar: No ataxia with FNF bilaterally Gait: Deferred   Lab Results: Results for orders placed or performed during the hospital encounter of 03/01/23 (from the past 48 hour(s))  Protime-INR     Status: None   Collection Time: 03/01/23  3:03 PM  Result Value Ref Range   Prothrombin Time 14.2 11.4 - 15.2 seconds   INR 1.1 0.8 - 1.2    Comment: (NOTE) INR  goal varies based on device and disease states. Performed at [facility], [address] [place], [place] [address]   APTT     Status: None   Collection Time: 03/01/23  3:03 PM  Result Value Ref Range   aPTT 31 24 - 36 seconds    Comment: Performed at [facility], [address] [place], [place] [address]  CBC     Status: Abnormal   Collection Time: 03/01/23  3:03 PM  Result Value Ref Range   WBC 6.2 4.0 - 10.5 K/uL   RBC 3.89 (L) 4.22 - 5.81 MIL/uL   Hemoglobin 12.2 (L) 13.0 - 17.0 g/dL   HCT 09.8 (L) 11.9 - 14.7 %   MCV 93.6 80.0 - 100.0 fL   MCH 31.4 26.0 - 34.0 pg   MCHC 33.5 30.0 - 36.0 g/dL   RDW 82.9 56.2 - 13.0 %   Platelets 232 150 - 400 K/uL   nRBC 0.0 0.0 - 0.2 %    Comment: Performed at [facility], [address] [place], [place] [address]  Differential     Status: None   Collection Time: 03/01/23  3:03 PM  Result Value Ref Range   Neutrophils Relative % 62 %   Neutro Abs 3.8 1.7 - 7.7 K/uL   Lymphocytes Relative 20 %   Lymphs Abs 1.2 0.7 -  4.0 K/uL   Monocytes Relative 10 %   Monocytes Absolute 0.6 0.1 - 1.0 K/uL   Eosinophils Relative 6 %   Eosinophils Absolute 0.4 0.0 - 0.5 K/uL   Basophils Relative 1 %   Basophils Absolute 0.1 0.0 - 0.1 K/uL   Immature Granulocytes 1 %   Abs Immature Granulocytes 0.04 0.00 - 0.07 K/uL    Comment: Performed at [facility], [address] [place], [place] [address]  Comprehensive metabolic panel     Status: Abnormal   Collection Time: 03/01/23  3:03 PM  Result Value Ref Range   Sodium 138 135 - 145 mmol/L   Potassium 3.9 3.5 - 5.1 mmol/L   Chloride 102 98 - 111 mmol/L   CO2 25 22 - 32 mmol/L   Glucose, Bld 115 (H) 70 - 99 mg/dL    Comment: Glucose reference range applies only to samples taken after fasting for at least 8 hours.   BUN 27 (H) 8 - 23 mg/dL   Creatinine, Ser 3.15 (H) 0.61 - 1.24 mg/dL   Calcium 8.9 8.9 - 17.6 mg/dL   Total Protein 7.3 6.5 - 8.1 g/dL    Albumin 4.2 3.5 - 5.0 g/dL   AST 16 15 - 41 U/L   ALT 14 0 - 44 U/L   Alkaline Phosphatase 99 38 - 126 U/L   Total Bilirubin 1.4 (H) 0.3 - 1.2 mg/dL   GFR, Estimated 49 (L) >60 mL/min    Comment: (NOTE) Calculated using the CKD-EPI Creatinine Equation (2021)    Anion gap 11 5 - 15    Comment: Performed at [facility], [address] [place], [place] [address]  Ethanol     Status: None   Collection Time: 03/01/23  3:03 PM  Result Value Ref Range   Alcohol, Ethyl (B) <10 <10 mg/dL    Comment: (NOTE) Lowest detectable limit for serum alcohol is 10 mg/dL.  For medical purposes only. Performed at [facility], [address] [place], [place] [address]   Hemoglobin A1c     Status: Abnormal   Collection Time: 03/01/23  3:03 PM  Result Value Ref Range   Hgb A1c MFr Bld 6.0 (H) 4.8 - 5.6 %    Comment: (NOTE) Pre diabetes:          5.7%-6.4%  Diabetes:              >6.4%  Glycemic control for   <7.0% adults with diabetes    Mean Plasma Glucose 125.5 mg/dL    Comment: Performed at [facility], [address], [place], [place] [address]  CBG monitoring, ED     Status: Abnormal   Collection Time: 03/01/23  3:08 PM  Result Value Ref Range   Glucose-Capillary 123 (H) 70 - 99 mg/dL    Comment: Glucose reference range applies only to samples taken after fasting for at least 8 hours.  CBG monitoring, ED     Status: Abnormal   Collection Time: 03/01/23  3:27 PM  Result Value Ref Range   Glucose-Capillary 109 (H) 70 - 99 mg/dL    Comment: Glucose reference range applies only to samples taken after fasting for at least 8 hours.  Glucose, capillary     Status: Abnormal   Collection Time: 03/01/23 10:16 PM  Result Value Ref Range   Glucose-Capillary 104 (H) 70 - 99 mg/dL    Comment: Glucose reference range applies only to samples taken after fasting for at least  8 hours.  Lipid panel     Status: Abnormal   Collection Time: 03/02/23  4:20 AM  Result Value  Ref Range   Cholesterol 179 0 - 200 mg/dL   Triglycerides 536 (H) <150 mg/dL   HDL 46 >64 mg/dL   Total CHOL/HDL Ratio 3.9 RATIO   VLDL 33 0 - 40 mg/dL   LDL Cholesterol 403 (H) 0 - 99 mg/dL    Comment:        Total Cholesterol/HDL:CHD Risk Coronary Heart Disease Risk Table                     Men   Women  1/2 Average Risk   3.4   3.3  Average Risk       5.0   4.4  2 X Average Risk   9.6   7.1  3 X Average Risk  23.4   11.0        Use the calculated Patient Ratio above and the CHD Risk Table to determine the patient's CHD Risk.        ATP III CLASSIFICATION (LDL):  <100     mg/dL   Optimal  474-259  mg/dL   Near or Above                    Optimal  130-159  mg/dL   Borderline  563-875  mg/dL   High  >643     mg/dL   Very High Performed at [facility], [address] [place], [place] [address]   Glucose, capillary     Status: Abnormal   Collection Time: 03/02/23  7:36 AM  Result Value Ref Range   Glucose-Capillary 100 (H) 70 - 99 mg/dL    Comment: Glucose reference range applies only to samples taken after fasting for at least 8 hours.  Glucose, capillary     Status: None   Collection Time: 03/02/23 12:58 PM  Result Value Ref Range   Glucose-Capillary 87 70 - 99 mg/dL    Comment: Glucose reference range applies only to samples taken after fasting for at least 8 hours.    No results found for this or any previous visit (from the past 240 hour(s)).  Lipid Panel Recent Labs    03/02/23 0420  CHOL 179  TRIG 167*  HDL 46  CHOLHDL 3.9  VLDL 33  LDLCALC 884*    Studies/Results: ECHOCARDIOGRAM COMPLETE  Result Date: 03/02/2023    ECHOCARDIOGRAM REPORT   Patient Name:   ETHANJACOB POLIS Date of Exam: 03/02/2023 Medical Rec #:  [id number]      Height:       69.0 in Accession #:    [id number]     Weight:       220.0 lb Date of Birth:  02-07-1934      BSA:          2.151 m Patient Age:    89 years       BP:           165/84 mmHg Patient Gender: M              HR:            65 bpm. Exam Location:  [facility] Procedure: 2D Echo, Cardiac Doppler and Color Doppler Indications:     TIA G45.9  History:         Patient has no prior history of Echocardiogram examinations.  TIA; Risk Factors:Former Smoker.  Sonographer:     Dondra Prader RVT RCS Referring Phys:  [id number] JENNIFER YATES Diagnosing Phys: Julien Nordmann MD IMPRESSIONS  1. Left ventricular ejection fraction, by estimation, is 60 to 65%. The left ventricle has normal function. The left ventricle has no regional wall motion abnormalities. There is mild left ventricular hypertrophy. Left ventricular diastolic parameters are indeterminate.  2. Right ventricular systolic function is normal. The right ventricular size is normal. There is mildly elevated pulmonary artery systolic pressure. The estimated right ventricular systolic pressure is 39.1 mmHg.  3. Left atrial size was moderately dilated.  4. The mitral valve is normal in structure. Moderate mitral valve regurgitation. No evidence of mitral stenosis.  5. Tricuspid valve regurgitation is mild to moderate.  6. The aortic valve is tricuspid. There is mild calcification of the aortic valve. Aortic valve regurgitation is mild. Aortic valve sclerosis is present, with no evidence of aortic valve stenosis.  7. The inferior vena cava is normal in size with greater than 50% respiratory variability, suggesting right atrial pressure of 3 mmHg. FINDINGS  Left Ventricle: Left ventricular ejection fraction, by estimation, is 60 to 65%. The left ventricle has normal function. The left ventricle has no regional wall motion abnormalities. The left ventricular internal cavity size was normal in size. There is  mild left ventricular hypertrophy. Left ventricular diastolic parameters are indeterminate. Right Ventricle: The right ventricular size is normal. No increase in right ventricular wall thickness. Right ventricular systolic function is normal. There is mildly elevated pulmonary  artery systolic pressure. The tricuspid regurgitant velocity is 2.92  m/s, and with an assumed right atrial pressure of 5 mmHg, the estimated right ventricular systolic pressure is 39.1 mmHg. Left Atrium: Left atrial size was moderately dilated. Right Atrium: Right atrial size was normal in size. Pericardium: There is no evidence of pericardial effusion. Mitral Valve: The mitral valve is normal in structure. Moderate mitral valve regurgitation. No evidence of mitral valve stenosis. Tricuspid Valve: The tricuspid valve is normal in structure. Tricuspid valve regurgitation is mild to moderate. No evidence of tricuspid stenosis. Aortic Valve: The aortic valve is tricuspid. There is mild calcification of the aortic valve. Aortic valve regurgitation is mild. Aortic regurgitation PHT measures 528 msec. Aortic valve sclerosis is present, with no evidence of aortic valve stenosis. Aortic valve mean gradient measures 4.0 mmHg. Aortic valve peak gradient measures 6.7 mmHg. Aortic valve area, by VTI measures 2.50 cm. Pulmonic Valve: The pulmonic valve was normal in structure. Pulmonic valve regurgitation is not visualized. No evidence of pulmonic stenosis. Aorta: The aortic root is normal in size and structure. Venous: The inferior vena cava is normal in size with greater than 50% respiratory variability, suggesting right atrial pressure of 3 mmHg. IAS/Shunts: No atrial level shunt detected by color flow Doppler.  LEFT VENTRICLE PLAX 2D LVIDd:         4.70 cm   Diastology LVIDs:         2.60 cm   LV e' medial:    7.51 cm/s LV PW:         1.30 cm   LV E/e' medial:  12.1 LV IVS:        1.30 cm   LV e' lateral:   10.30 cm/s LVOT diam:     2.10 cm   LV E/e' lateral: 8.9 LV SV:         72 LV SV Index:   33 LVOT Area:     3.46 cm  RIGHT VENTRICLE             IVC RV Basal diam:  3.60 cm     IVC diam: 2.30 cm RV S prime:     16.40 cm/s TAPSE (M-mode): 2.8 cm LEFT ATRIUM             Index        RIGHT ATRIUM           Index LA diam:         4.70 cm 2.18 cm/m   RA Area:     26.30 cm LA Vol (A2C):   80.3 ml 37.32 ml/m  RA Volume:   82.40 ml  38.30 ml/m LA Vol (A4C):   82.3 ml 38.25 ml/m LA Biplane Vol: 84.8 ml 39.41 ml/m  AORTIC VALVE                    PULMONIC VALVE AV Area (Vmax):    2.38 cm     PV Vmax:       0.83 m/s AV Area (Vmean):   2.37 cm     PV Peak grad:  2.8 mmHg AV Area (VTI):     2.50 cm AV Vmax:           129.00 cm/s AV Vmean:          87.400 cm/s AV VTI:            0.287 m AV Peak Grad:      6.7 mmHg AV Mean Grad:      4.0 mmHg LVOT Vmax:         88.60 cm/s LVOT Vmean:        59.700 cm/s LVOT VTI:          0.207 m LVOT/AV VTI ratio: 0.72 AI PHT:            528 msec  AORTA Ao Root diam: 3.30 cm Ao Asc diam:  3.70 cm MITRAL VALVE               TRICUSPID VALVE MV Area (PHT): 3.65 cm    TV Peak grad:   34.1 mmHg MV Decel Time: 208 msec    TV Vmax:        2.92 m/s MV E velocity: 91.20 cm/s  TR Peak grad:   34.1 mmHg MV A velocity: 65.50 cm/s  TR Vmax:        292.00 cm/s MV E/A ratio:  1.39                            SHUNTS                            Systemic VTI:  0.21 m                            Systemic Diam: 2.10 cm Julien Nordmann MD Electronically signed by Julien Nordmann MD Signature Date/Time: 03/02/2023/1:29:47 PM    Final    MR BRAIN WO CONTRAST  Result Date: 03/01/2023 CLINICAL DATA:  Initial evaluation for neuro deficit, stroke. EXAM: MRI HEAD WITHOUT CONTRAST MRA HEAD WITHOUT CONTRAST MRA NECK WITHOUT AND WITH CONTRAST TECHNIQUE: Multiplanar, multi-echo pulse sequences of the brain and surrounding structures were acquired without intravenous contrast. Angiographic images of the Circle of Willis were acquired using MRA technique without intravenous contrast. Angiographic  images of the neck were acquired using MRA technique without and with intravenous contrast. Carotid stenosis measurements (when applicable) are obtained utilizing NASCET criteria, using the distal internal carotid diameter as the denominator.  CONTRAST:  10mL GADAVIST GADOBUTROL 1 MMOL/ML IV SOLN COMPARISON:  Comparison made with CT from earlier the same day. FINDINGS: MRI HEAD FINDINGS Brain: Diffuse prominence of the CSF containing spaces compatible generalized cerebral atrophy. Mild chronic microvascular ischemic disease for age noted involving the supratentorial cerebral white matter. 9 mm acute ischemic infarcts seen involving the superior right insula (series 5, image 26). Small focus of associated petechial blood products without frank hemorrhagic transformation or mass effect (series 10, image 35). No other evidence for acute or subacute ischemia. Gray-white matter differentiation otherwise maintained. No other acute or chronic intracranial blood products. No mass lesion, midline shift or mass effect. No hydrocephalus or extra-axial fluid collection. Pituitary gland and suprasellar region within normal limits. Vascular: Major intracranial vascular flow voids are maintained. Skull and upper cervical spine: Cranial junction normal. Bone marrow signal intensity within normal limits. No scalp soft tissue abnormality. Sinuses/Orbits: Prior bilateral ocular lens replacement. Paranasal sinuses are clear. No mastoid effusion. Other: None. MRA HEAD FINDINGS Anterior circulation: Both internal carotid arteries are patent to the termini without stenosis or other abnormality. A1 segments, anterior artery complex common anterior cerebral arteries patent without stenosis. No M1 stenosis or occlusion. No proximal MCA branch occlusion or high-grade stenosis. Distal MCA branches perfused and symmetric. Posterior circulation: Both V4 segments patent without stenosis. Right PICA patent. Left PICA origin not seen. Basilar patent without stenosis. Superior cerebellar and posterior cerebral arteries widely patent bilaterally. Anatomic variants: None significant.  No aneurysm. MRA NECK FINDINGS Aortic arch: Visualized aortic arch within normal limits for caliber with  standard branch pattern. No stenosis about the origin the great vessels. Right carotid system: Right common and internal carotid arteries are patent without dissection. Mild for age atheromatous irregularity about the right carotid bulb without hemodynamically significant stenosis. Left carotid system: Left common and internal carotid arteries are patent without evidence for dissection. Minimal atheromatous irregularity about the left carotid bulb for age without hemodynamically significant stenosis. Vertebral arteries: Both vertebral arteries arise from the subclavian arteries. No significant proximal subclavian artery stenosis. Vertebral arteries patent without stenosis or evidence for dissection. Other: None. IMPRESSION: MRI HEAD IMPRESSION: 1. 9 mm acute ischemic infarct involving the superior right insula. Small focus of associated petechial blood products without frank hemorrhagic transformation or mass effect. 2. Underlying age-related cerebral atrophy with mild chronic small vessel ischemic disease. MRA HEAD IMPRESSION: Negative intracranial MRA for large vessel occlusion or other emergent finding. No hemodynamically significant or correctable stenosis. MRA NECK IMPRESSION: 1. Negative MRA of the neck for large vessel occlusion or other emergent finding. 2. Mild for age atheromatous irregularity about the carotid bifurcations without hemodynamically significant stenosis. Electronically Signed   By: Rise Mu M.D.   On: 03/01/2023 20:41   MR ANGIO HEAD WO CONTRAST  Result Date: 03/01/2023 CLINICAL DATA:  Initial evaluation for neuro deficit, stroke. EXAM: MRI HEAD WITHOUT CONTRAST MRA HEAD WITHOUT CONTRAST MRA NECK WITHOUT AND WITH CONTRAST TECHNIQUE: Multiplanar, multi-echo pulse sequences of the brain and surrounding structures were acquired without intravenous contrast. Angiographic images of the Circle of Willis were acquired using MRA technique without intravenous contrast. Angiographic  images of the neck were acquired using MRA technique without and with intravenous contrast. Carotid stenosis measurements (when applicable) are obtained utilizing NASCET criteria, using the distal internal  carotid diameter as the denominator. CONTRAST:  10mL GADAVIST GADOBUTROL 1 MMOL/ML IV SOLN COMPARISON:  Comparison made with CT from earlier the same day. FINDINGS: MRI HEAD FINDINGS Brain: Diffuse prominence of the CSF containing spaces compatible generalized cerebral atrophy. Mild chronic microvascular ischemic disease for age noted involving the supratentorial cerebral white matter. 9 mm acute ischemic infarcts seen involving the superior right insula (series 5, image 26). Small focus of associated petechial blood products without frank hemorrhagic transformation or mass effect (series 10, image 35). No other evidence for acute or subacute ischemia. Gray-white matter differentiation otherwise maintained. No other acute or chronic intracranial blood products. No mass lesion, midline shift or mass effect. No hydrocephalus or extra-axial fluid collection. Pituitary gland and suprasellar region within normal limits. Vascular: Major intracranial vascular flow voids are maintained. Skull and upper cervical spine: Cranial junction normal. Bone marrow signal intensity within normal limits. No scalp soft tissue abnormality. Sinuses/Orbits: Prior bilateral ocular lens replacement. Paranasal sinuses are clear. No mastoid effusion. Other: None. MRA HEAD FINDINGS Anterior circulation: Both internal carotid arteries are patent to the termini without stenosis or other abnormality. A1 segments, anterior artery complex common anterior cerebral arteries patent without stenosis. No M1 stenosis or occlusion. No proximal MCA branch occlusion or high-grade stenosis. Distal MCA branches perfused and symmetric. Posterior circulation: Both V4 segments patent without stenosis. Right PICA patent. Left PICA origin not seen. Basilar patent  without stenosis. Superior cerebellar and posterior cerebral arteries widely patent bilaterally. Anatomic variants: None significant.  No aneurysm. MRA NECK FINDINGS Aortic arch: Visualized aortic arch within normal limits for caliber with standard branch pattern. No stenosis about the origin the great vessels. Right carotid system: Right common and internal carotid arteries are patent without dissection. Mild for age atheromatous irregularity about the right carotid bulb without hemodynamically significant stenosis. Left carotid system: Left common and internal carotid arteries are patent without evidence for dissection. Minimal atheromatous irregularity about the left carotid bulb for age without hemodynamically significant stenosis. Vertebral arteries: Both vertebral arteries arise from the subclavian arteries. No significant proximal subclavian artery stenosis. Vertebral arteries patent without stenosis or evidence for dissection. Other: None. IMPRESSION: MRI HEAD IMPRESSION: 1. 9 mm acute ischemic infarct involving the superior right insula. Small focus of associated petechial blood products without frank hemorrhagic transformation or mass effect. 2. Underlying age-related cerebral atrophy with mild chronic small vessel ischemic disease. MRA HEAD IMPRESSION: Negative intracranial MRA for large vessel occlusion or other emergent finding. No hemodynamically significant or correctable stenosis. MRA NECK IMPRESSION: 1. Negative MRA of the neck for large vessel occlusion or other emergent finding. 2. Mild for age atheromatous irregularity about the carotid bifurcations without hemodynamically significant stenosis. Electronically Signed   By: Rise Mu M.D.   On: 03/01/2023 20:41   MR ANGIO NECK W WO CONTRAST  Result Date: 03/01/2023 CLINICAL DATA:  Initial evaluation for neuro deficit, stroke. EXAM: MRI HEAD WITHOUT CONTRAST MRA HEAD WITHOUT CONTRAST MRA NECK WITHOUT AND WITH CONTRAST TECHNIQUE:  Multiplanar, multi-echo pulse sequences of the brain and surrounding structures were acquired without intravenous contrast. Angiographic images of the Circle of Willis were acquired using MRA technique without intravenous contrast. Angiographic images of the neck were acquired using MRA technique without and with intravenous contrast. Carotid stenosis measurements (when applicable) are obtained utilizing NASCET criteria, using the distal internal carotid diameter as the denominator. CONTRAST:  10mL GADAVIST GADOBUTROL 1 MMOL/ML IV SOLN COMPARISON:  Comparison made with CT from earlier the same day. FINDINGS: MRI HEAD  FINDINGS Brain: Diffuse prominence of the CSF containing spaces compatible generalized cerebral atrophy. Mild chronic microvascular ischemic disease for age noted involving the supratentorial cerebral white matter. 9 mm acute ischemic infarcts seen involving the superior right insula (series 5, image 26). Small focus of associated petechial blood products without frank hemorrhagic transformation or mass effect (series 10, image 35). No other evidence for acute or subacute ischemia. Gray-white matter differentiation otherwise maintained. No other acute or chronic intracranial blood products. No mass lesion, midline shift or mass effect. No hydrocephalus or extra-axial fluid collection. Pituitary gland and suprasellar region within normal limits. Vascular: Major intracranial vascular flow voids are maintained. Skull and upper cervical spine: Cranial junction normal. Bone marrow signal intensity within normal limits. No scalp soft tissue abnormality. Sinuses/Orbits: Prior bilateral ocular lens replacement. Paranasal sinuses are clear. No mastoid effusion. Other: None. MRA HEAD FINDINGS Anterior circulation: Both internal carotid arteries are patent to the termini without stenosis or other abnormality. A1 segments, anterior artery complex common anterior cerebral arteries patent without stenosis. No M1  stenosis or occlusion. No proximal MCA branch occlusion or high-grade stenosis. Distal MCA branches perfused and symmetric. Posterior circulation: Both V4 segments patent without stenosis. Right PICA patent. Left PICA origin not seen. Basilar patent without stenosis. Superior cerebellar and posterior cerebral arteries widely patent bilaterally. Anatomic variants: None significant.  No aneurysm. MRA NECK FINDINGS Aortic arch: Visualized aortic arch within normal limits for caliber with standard branch pattern. No stenosis about the origin the great vessels. Right carotid system: Right common and internal carotid arteries are patent without dissection. Mild for age atheromatous irregularity about the right carotid bulb without hemodynamically significant stenosis. Left carotid system: Left common and internal carotid arteries are patent without evidence for dissection. Minimal atheromatous irregularity about the left carotid bulb for age without hemodynamically significant stenosis. Vertebral arteries: Both vertebral arteries arise from the subclavian arteries. No significant proximal subclavian artery stenosis. Vertebral arteries patent without stenosis or evidence for dissection. Other: None. IMPRESSION: MRI HEAD IMPRESSION: 1. 9 mm acute ischemic infarct involving the superior right insula. Small focus of associated petechial blood products without frank hemorrhagic transformation or mass effect. 2. Underlying age-related cerebral atrophy with mild chronic small vessel ischemic disease. MRA HEAD IMPRESSION: Negative intracranial MRA for large vessel occlusion or other emergent finding. No hemodynamically significant or correctable stenosis. MRA NECK IMPRESSION: 1. Negative MRA of the neck for large vessel occlusion or other emergent finding. 2. Mild for age atheromatous irregularity about the carotid bifurcations without hemodynamically significant stenosis. Electronically Signed   By: Rise Mu M.D.    On: 03/01/2023 20:41   US Carotid Bilateral (at [facility] and [facility] only)  Result Date: 03/01/2023 CLINICAL DATA:  Transient ischemic attack EXAM: BILATERAL CAROTID DUPLEX ULTRASOUND TECHNIQUE: Wallace Cullens scale imaging, color Doppler and duplex ultrasound were performed of bilateral carotid and vertebral arteries in the neck. COMPARISON:  None Available. FINDINGS: Criteria: Quantification of carotid stenosis is based on velocity parameters that correlate the residual internal carotid diameter with NASCET-based stenosis levels, using the diameter of the distal internal carotid lumen as the denominator for stenosis measurement. The following velocity measurements were obtained: RIGHT ICA: 121/30 cm/sec CCA: 82/15 cm/sec SYSTOLIC ICA/CCA RATIO:  1.5 ECA: 127 cm/sec LEFT ICA: 101/33 cm/sec CCA: 76/17 cm/sec SYSTOLIC ICA/CCA RATIO:  1.3 ECA: 105 cm/sec RIGHT CAROTID ARTERY: There is moderate smooth noncalcified atherosclerotic plaque seen within the right carotid bulb. RIGHT VERTEBRAL ARTERY:  Antegrade LEFT CAROTID ARTERY: Mild smooth noncalcified atherosclerotic plaque within  the left carotid bulb. LEFT VERTEBRAL ARTERY:  Antegrade The arterial waveform is regular. IMPRESSION: 1. Moderate right and mild left carotid bulb atherosclerotic plaque. No hemodynamically significant stenosis. Electronically Signed   By: Helyn Numbers M.D.   On: 03/01/2023 19:35   CT HEAD CODE STROKE WO CONTRAST  Addendum Date: 03/01/2023   ADDENDUM REPORT: 03/01/2023 15:46 ADDENDUM: Findings communicated to Dr Otelia Limes via Loretha Stapler at 3:40 pm. Electronically Signed   By: Lesia Hausen M.D.   On: 03/01/2023 15:46   Result Date: 03/01/2023 CLINICAL DATA:  Code stroke. EXAM: CT HEAD WITHOUT CONTRAST TECHNIQUE: Contiguous axial images were obtained from the base of the skull through the vertex without intravenous contrast. RADIATION DOSE REDUCTION: This exam was performed according to the departmental dose-optimization program which includes automated  exposure control, adjustment of the mA and/or kV according to patient size and/or use of iterative reconstruction technique. COMPARISON:  None Available. FINDINGS: Brain: There is no acute intracranial hemorrhage, extra-axial fluid collection, or acute infarct Parenchymal volume is within expected limits for age. The ventricles are normal in size. Gray-white differentiation is preserved An empty sella is noted, nonspecific. The suprasellar region is normal. There is no mass lesion. There is no mass effect or midline shift. Vascular: No hyperdense vessel or unexpected calcification. Skull: Normal. Negative for fracture or focal lesion. Sinuses/Orbits: The imaged paranasal sinuses are clear. Bilateral lens implants are in place. The globes and orbits are otherwise unremarkable. Other: The mastoid air cells and middle ear cavities are clear. ASPECTS [facility] Stroke Program Early CT Score) - Ganglionic level infarction (caudate, lentiform nuclei, internal capsule, insula, M1-M3 cortex): 7 - Supraganglionic infarction (M4-M6 cortex): 3 Total score (0-10 with 10 being normal): 10 IMPRESSION: No acute intracranial pathology. Electronically Signed: By: Lesia Hausen M.D. On: 03/01/2023 15:41    Medications: Scheduled:  aspirin  300 mg Rectal Daily   Or   aspirin  325 mg Oral Daily   atorvastatin  40 mg Oral Daily   clopidogrel  75 mg Oral Daily   enoxaparin (LOVENOX) injection  0.5 mg/kg Subcutaneous Q24H   insulin aspart  0-15 Units Subcutaneous TID WC    Assessment: 89 year old male who presented to the ED yesterday with acute onset of left facial droop, slurred speech and gait unsteadiness.  - Exam today is nonfocal. Subjectively, the patient is back to baseline.   - Neuroimaging: - MRI brain: 9 mm acute ischemic infarct involving the superior right insula. Small focus of associated petechial blood products without frank hemorrhagic transformation or mass effect. Underlying age-related cerebral atrophy with  mild chronic small vessel ischemic disease. - MRA head: Negative intracranial MRA for large vessel occlusion or other emergent finding. No hemodynamically significant or correctable stenosis. - MRA neck: Negative MRA of the neck for large vessel occlusion or other emergent finding. Mild for age atheromatous irregularity about the carotid bifurcations without hemodynamically significant stenosis. - TTE: LVEF 60 to 65%. The left ventricle has normal function with no regional wall motion abnormalities. There is mild left ventricular hypertrophy. No mural thrombus or valvular vegetation mentioned in the report.  - Carotid ultrasound: Moderate right and mild left carotid bulb atherosclerotic plaque. No hemodynamically significant stenosis. - EKG: Sinus rhythm with 1st degree A-V block; Right bundle branch block - Labs: BUN and Cr were elevated at 27 and 1.39 on admission, respectively. This may have been secondary to volume depletion.  - Overall impression: Small acute cortically-based ischemic stroke involving the right insula .  Recommendations: - Started on DAPT: ASA 81 mg po every day and Plavix 75 mg po every day. Continue DAPT for 21 days, then discontinue Plavix and continue ASA monotherapy thereafter.    - Atorvastatin 40 mg po every day.  - Will need outpatient Neurology follow up at the [facility] clinic - No restrictions on golfing, but will need to stay well-hydrated.  - Neurohospitalist service will sign off. Please call if there are additional questions.      LOS: 0 days   @Electronically  signed: Dr. Caryl Pina 03/02/2023  2:25 PM

## 2023-03-02 NOTE — Progress Notes (Signed)
Got a call from CCMD, his ECG rhythms shows Afib,no any symptoms noted. MD made aware.EKG done.Plan of care ongoing.

## 2023-03-02 NOTE — Discharge Summary (Signed)
Physician Discharge Summary   Patient: Jonathan [name] MRN: [id number]  DOB: [birth year]   Admit:     Date of Admission: 03/01/2023 Admitted from: home   Discharge: Date of discharge: 03/02/23 Disposition: Home Condition at discharge: good  CODE STATUS: FULL CODE     Discharge Physician: Sunnie Nielsen, DO [facility]     PCP: Marina Goodell, MD  Recommendations for Outpatient Follow-up:  Follow up with PCP Marina Goodell, MD in 1-2 weeks Please obtain labs/tests: CBC, BMP in 204 weeks, CMP/lipids in 6 weeks.  Please follow up on the following pending results: none PCP AND OTHER OUTPATIENT PROVIDERS: SEE BELOW FOR SPECIFIC DISCHARGE INSTRUCTIONS PRINTED FOR PATIENT IN ADDITION TO GENERIC AVS PATIENT INFO     Discharge Instructions     Ambulatory referral to Cardiology   Complete by: As directed    Wife sees dr Kirke Corin   Diet - low sodium heart healthy   Complete by: As directed    Increase activity slowly   Complete by: As directed          Discharge Diagnoses: Principal Problem:   Stroke-like symptoms Active Problems:   Benign hypertension   Hyperlipidemia   Prediabetes   Obesity (BMI 30.0-34.9)   History of prostate cancer   Chronic kidney disease, stage 3a [facility])       Hospital Course:  Jonathan [name] is a 89 y.o. male with medical history significant of prostate CA, HTN, HLD, and pre-diabetes presenting with stroke-like symptoms.  He reports that he was playing golf this AM and acutely developed some difficulty with speech.  He knew what he wanted to say but was slurring.  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  He went home and started eating some crackers and drinking and was drooling and couldn't keep them in his mouth so he came to the ER.  The speech symptoms have resolved but he is unsure about the facial droop.  He did feel unbalanced during  the episode but not frankly weak on either side; this has also resolved.    09/06: to ED. Code stroke called. No TNK. CT head neg. Admitted to hospitalist service. MRI brain demonstrating 9 mm acute ischemic infarct involving the superior right insula. 09/07: Echo no concerns. (+)Afib on telemetry and confirmed by EKG, starting on Eliquis and Coreg though rate has been normal, also increased atorvastatin. Pt requests to follow up w/ Dr Kirke Corin for cardiology as his wife also sees him.   Consultants:  neurology  Procedures: none      ASSESSMENT & PLAN:    CVA w/ MRI demonstrating 9 mm acute ischemic infarct involving the superior right insula. New diagnosis atrial fibrillation  Telemetry --> (+)Affib MRI/MRA --> see full report below, (+) "9 mm acute ischemic infarct involving the superior right insula.Small focus of associated petechial blood products without frank hemorrhagic transformation or mass effect. Underlying age-related cerebral atrophy, chronic small vessel ischemic dz. MRA head  and MRA neck negative for large vessel occlusion / correctable stenosis" Carotid dopplers --> (+)atherosclerosis w/o significant stenosis  Echo --> no concerns  Neurology consult --> see notes  PT/OT/ST/Nutrition Consults --> no concerns  A1C --> see below Lipids --> see below   Atrial fibrillation Eliquis Coreg Cardiology referral    HTN Allow permissive HTN for now Hold hydrochlorothiazide Can restart lisinopril Starting coreg for  afib rate control    HLD 8/2 lipid profile: 174/47/95/160 Resume statin but will change simvastatin to Lipitor 40 mg daily   DM Recent A1c was 6.1, good control No home meds He should not need SSI at this time   Stage 3a CKD Appears to be stable at this time avoid nephrotoxic medications Monitor BMP    Obesity Body mass index is 32.49 kg/m.Marland Kitchen  Encourage weight loss to mitigate effects / risk factors associated w/ DM2, HLD, HTN,TIA  Hx Prostate  CA Recent normal PSA Continues in remission Follow outpatient        Discharge Instructions  Allergies as of 03/02/2023       Reactions   Penicillin V Potassium Other (See Comments)   Penicillins Rash        Medication List     STOP taking these medications    hydrochlorothiazide 25 MG tablet Commonly known as: HYDRODIURIL   simvastatin 10 MG tablet Commonly known as: ZOCOR       TAKE these medications    acetaminophen 500 MG tablet Commonly known as: TYLENOL Take 1 tablet (500 mg total) by mouth every 6 (six) hours as needed.   apixaban 5 MG Tabs tablet Commonly known as: ELIQUIS Take 1 tablet (5 mg total) by mouth 2 (two) times daily.   atorvastatin 40 MG tablet Commonly known as: Lipitor Take 1 tablet (40 mg total) by mouth daily.   B-12 500 MCG Tabs Take 1 tablet by mouth daily.   carvedilol 3.125 MG tablet Commonly known as: Coreg Take 1 tablet (3.125 mg total) by mouth 2 (two) times daily.   lisinopril 20 MG tablet Commonly known as: ZESTRIL Take 20 mg by mouth daily.   Vitamin D 50 MCG (2000 UT) Caps Take 1 capsule (2,000 Units total) by mouth daily.          Allergies  Allergen Reactions   Penicillin V Potassium Other (See Comments)   Penicillins Rash     Subjective: pt feeling great and eager for d/c home, no new weakness, no palpitations    Discharge Exam: BP 135/83 (BP Location: Left Arm)   Pulse 88   Temp 98.2 F (36.8 C)   Resp 17   Ht 5\' 9"  (1.753 m)   Wt 99.8 kg   SpO2 94%   BMI 32.49 kg/m  General: Pt is alert, awake, not in acute distress Cardiovascular: Rate WNL, irreg/irreg on exam in pm, no rubs, no gallops Respiratory: CTA bilaterally, no wheezing, no rhonchi Abdominal: Soft, NT, ND, bowel sounds + Extremities: no edema, no cyanosis     The results of significant diagnostics from this hospitalization (including imaging, microbiology, ancillary and laboratory) are listed below for reference.      Microbiology: No results found for this or any previous visit (from the past 240 hour(s)).   Labs: BNP (last 3 results) No results for input(s): "BNP" in the last 8760 hours. Basic Metabolic Panel: Recent Labs  Lab 03/01/23 1503  NA 138  K 3.9  CL 102  CO2 25  GLUCOSE 115*  BUN 27*  CREATININE 1.39*  CALCIUM 8.9   Liver Function Tests: Recent Labs  Lab 03/01/23 1503  AST 16  ALT 14  ALKPHOS 99  BILITOT 1.4*  PROT 7.3  ALBUMIN 4.2   No results for input(s): "LIPASE", "AMYLASE" in the last 168 hours. No results for input(s): "AMMONIA" in the last 168 hours. CBC: Recent Labs  Lab 03/01/23 1503  WBC 6.2  NEUTROABS  3.8  HGB 12.2*  HCT 36.4*  MCV 93.6  PLT 232   Cardiac Enzymes: Recent Labs  Lab 03/02/23 1559  CKTOTAL 91   BNP: Invalid input(s): "POCBNP" CBG: Recent Labs  Lab 03/01/23 1527 03/01/23 2216 03/02/23 0736 03/02/23 1258 03/02/23 1652  GLUCAP 109* 104* 100* 87 103*   D-Dimer No results for input(s): "DDIMER" in the last 72 hours. Hgb A1c Recent Labs    03/01/23 1503  HGBA1C 6.0*   Lipid Profile Recent Labs    03/02/23 0420  CHOL 179  HDL 46  LDLCALC 100*  TRIG 167*  CHOLHDL 3.9   Thyroid function studies No results for input(s): "TSH", "T4TOTAL", "T3FREE", "THYROIDAB" in the last 72 hours.  Invalid input(s): "FREET3" Anemia work up No results for input(s): "VITAMINB12", "FOLATE", "FERRITIN", "TIBC", "IRON", "RETICCTPCT" in the last 72 hours. Urinalysis    Component Value Date/Time   APPEARANCEUR Clear 11/25/2017 1406   GLUCOSEU Negative 11/25/2017 1406   BILIRUBINUR Negative 11/25/2017 1406   PROTEINUR Negative 11/25/2017 1406   NITRITE Negative 11/25/2017 1406   LEUKOCYTESUR Negative 11/25/2017 1406   Sepsis Labs Recent Labs  Lab 03/01/23 1503  WBC 6.2   Microbiology No results found for this or any previous visit (from the past 240 hour(s)). Imaging ECHOCARDIOGRAM COMPLETE  Result Date: 03/02/2023     ECHOCARDIOGRAM REPORT   Patient Name:   TOMAS HEATON Date of Exam: 03/02/2023 Medical Rec #:  [id number]      Height:       69.0 in Accession #:    [id number]     Weight:       220.0 lb Date of Birth:  1934-05-26      BSA:          2.151 m Patient Age:    89 years       BP:           165/84 mmHg Patient Gender: M              HR:           65 bpm. Exam Location:  [facility] Procedure: 2D Echo, Cardiac Doppler and Color Doppler Indications:     TIA G45.9  History:         Patient has no prior history of Echocardiogram examinations.                  TIA; Risk Factors:Former Smoker.  Sonographer:     Dondra Prader RVT RCS Referring Phys:  [id number] JENNIFER YATES Diagnosing Phys: Julien Nordmann MD IMPRESSIONS  1. Left ventricular ejection fraction, by estimation, is 60 to 65%. The left ventricle has normal function. The left ventricle has no regional wall motion abnormalities. There is mild left ventricular hypertrophy. Left ventricular diastolic parameters are indeterminate.  2. Right ventricular systolic function is normal. The right ventricular size is normal. There is mildly elevated pulmonary artery systolic pressure. The estimated right ventricular systolic pressure is 39.1 mmHg.  3. Left atrial size was moderately dilated.  4. The mitral valve is normal in structure. Moderate mitral valve regurgitation. No evidence of mitral stenosis.  5. Tricuspid valve regurgitation is mild to moderate.  6. The aortic valve is tricuspid. There is mild calcification of the aortic valve. Aortic valve regurgitation is mild. Aortic valve sclerosis is present, with no evidence of aortic valve stenosis.  7. The inferior vena cava is normal in size with greater than 50% respiratory variability, suggesting right atrial pressure of 3 mmHg. FINDINGS  Left Ventricle: Left ventricular ejection fraction, by estimation, is 60 to 65%. The left ventricle has normal function. The left ventricle has no regional wall motion abnormalities. The left ventricular  internal cavity size was normal in size. There is  mild left ventricular hypertrophy. Left ventricular diastolic parameters are indeterminate. Right Ventricle: The right ventricular size is normal. No increase in right ventricular wall thickness. Right ventricular systolic function is normal. There is mildly elevated pulmonary artery systolic pressure. The tricuspid regurgitant velocity is 2.92  m/s, and with an assumed right atrial pressure of 5 mmHg, the estimated right ventricular systolic pressure is 39.1 mmHg. Left Atrium: Left atrial size was moderately dilated. Right Atrium: Right atrial size was normal in size. Pericardium: There is no evidence of pericardial effusion. Mitral Valve: The mitral valve is normal in structure. Moderate mitral valve regurgitation. No evidence of mitral valve stenosis. Tricuspid Valve: The tricuspid valve is normal in structure. Tricuspid valve regurgitation is mild to moderate. No evidence of tricuspid stenosis. Aortic Valve: The aortic valve is tricuspid. There is mild calcification of the aortic valve. Aortic valve regurgitation is mild. Aortic regurgitation PHT measures 528 msec. Aortic valve sclerosis is present, with no evidence of aortic valve stenosis. Aortic valve mean gradient measures 4.0 mmHg. Aortic valve peak gradient measures 6.7 mmHg. Aortic valve area, by VTI measures 2.50 cm. Pulmonic Valve: The pulmonic valve was normal in structure. Pulmonic valve regurgitation is not visualized. No evidence of pulmonic stenosis. Aorta: The aortic root is normal in size and structure. Venous: The inferior vena cava is normal in size with greater than 50% respiratory variability, suggesting right atrial pressure of 3 mmHg. IAS/Shunts: No atrial level shunt detected by color flow Doppler.  LEFT VENTRICLE PLAX 2D LVIDd:         4.70 cm   Diastology LVIDs:         2.60 cm   LV e' medial:    7.51 cm/s LV PW:         1.30 cm   LV E/e' medial:  12.1 LV IVS:        1.30 cm   LV e'  lateral:   10.30 cm/s LVOT diam:     2.10 cm   LV E/e' lateral: 8.9 LV SV:         72 LV SV Index:   33 LVOT Area:     3.46 cm  RIGHT VENTRICLE             IVC RV Basal diam:  3.60 cm     IVC diam: 2.30 cm RV S prime:     16.40 cm/s TAPSE (M-mode): 2.8 cm LEFT ATRIUM             Index        RIGHT ATRIUM           Index LA diam:        4.70 cm 2.18 cm/m   RA Area:     26.30 cm LA Vol (A2C):   80.3 ml 37.32 ml/m  RA Volume:   82.40 ml  38.30 ml/m LA Vol (A4C):   82.3 ml 38.25 ml/m LA Biplane Vol: 84.8 ml 39.41 ml/m  AORTIC VALVE                    PULMONIC VALVE AV Area (Vmax):    2.38 cm     PV Vmax:       0.83 m/s AV Area (Vmean):  2.37 cm     PV Peak grad:  2.8 mmHg AV Area (VTI):     2.50 cm AV Vmax:           129.00 cm/s AV Vmean:          87.400 cm/s AV VTI:            0.287 m AV Peak Grad:      6.7 mmHg AV Mean Grad:      4.0 mmHg LVOT Vmax:         88.60 cm/s LVOT Vmean:        59.700 cm/s LVOT VTI:          0.207 m LVOT/AV VTI ratio: 0.72 AI PHT:            528 msec  AORTA Ao Root diam: 3.30 cm Ao Asc diam:  3.70 cm MITRAL VALVE               TRICUSPID VALVE MV Area (PHT): 3.65 cm    TV Peak grad:   34.1 mmHg MV Decel Time: 208 msec    TV Vmax:        2.92 m/s MV E velocity: 91.20 cm/s  TR Peak grad:   34.1 mmHg MV A velocity: 65.50 cm/s  TR Vmax:        292.00 cm/s MV E/A ratio:  1.39                            SHUNTS                            Systemic VTI:  0.21 m                            Systemic Diam: 2.10 cm Julien Nordmann MD Electronically signed by Julien Nordmann MD Signature Date/Time: 03/02/2023/1:29:47 PM    Final       Time coordinating discharge: over 30 minutes  SIGNED:  Sunnie Nielsen DO [facility]

## 2023-03-04 NOTE — Group Note (Deleted)

## 2023-03-04 NOTE — Group Note (Deleted)

## 2023-03-07 ENCOUNTER — Encounter: Payer: Self-pay | Admitting: Cardiology

## 2023-03-07 ENCOUNTER — Ambulatory Visit: Payer: Medicare HMO | Attending: Cardiology | Admitting: Cardiology

## 2023-03-07 VITALS — BP 128/70 | HR 58 | Ht 69.0 in | Wt 215.6 lb

## 2023-03-07 DIAGNOSIS — I6523 Occlusion and stenosis of bilateral carotid arteries: Secondary | ICD-10-CM

## 2023-03-07 DIAGNOSIS — I480 Paroxysmal atrial fibrillation: Secondary | ICD-10-CM | POA: Diagnosis not present

## 2023-03-07 DIAGNOSIS — I10 Essential (primary) hypertension: Secondary | ICD-10-CM | POA: Diagnosis not present

## 2023-03-07 MED ORDER — APIXABAN 5 MG PO TABS: 5.0000 mg | ORAL_TABLET | Freq: Two times a day (BID) | ORAL | 3 refills | Status: DC

## 2023-03-07 MED ORDER — ATORVASTATIN CALCIUM 40 MG PO TABS: 40.0000 mg | ORAL_TABLET | Freq: Every day | ORAL | 3 refills | Status: DC

## 2023-03-07 MED ORDER — LISINOPRIL 20 MG PO TABS: 20.0000 mg | ORAL_TABLET | Freq: Every day | ORAL | 3 refills | Status: DC

## 2023-03-07 MED ORDER — METOPROLOL SUCCINATE ER 25 MG PO TB24: 25.0000 mg | ORAL_TABLET | Freq: Every day | ORAL | 3 refills | Status: DC

## 2023-03-07 NOTE — Progress Notes (Signed)
Cardiology Office Note:    Date:  03/07/2023   ID:  Jonathan [name], DOB 06/02/1934, MRN [id number]  PCP:  Marina Goodell, MD   [facility] Providers Cardiologist:  Debbe Odea, MD     Referring MD: Sunnie Nielsen, DO   Chief Complaint  Patient presents with   New Patient (Initial Visit)    Referred for cardiac evaluation of new Atrial fibrillation with no cardiac history.      History of Present Illness:    Jonathan [name] is a 89 y.o. male with a hx of hypertension, hyperlipidemia, prostate cancer presenting with atrial fibrillation.  Admitted to the hospital about a week ago due to speech difficulties while playing golf.  Drove himself to the hospital.  Diagnosed with acute ischemic infarct involving the superior right insula.  Telemetry showed atrial fibrillation which was confirmed on ECG.  Started on Eliquis, carvedilol.  HCTZ was held.  States feeling well since then, function has returned to 100%.  Denies palpitations, dizziness, presyncope or syncope.  Stays very active by playing golf, does his yard work around his house.  Wife also has atrial fibrillation  Echocardiogram 02/2023 showed EF 60 to 65%, moderately dilated left atrium.  Carotid ultrasound showed moderate right and left carotid atherosclerosis, no hemodynamic significant stenosis.  Past Medical History:  Diagnosis Date   Cancer (HCC)    prostate   Hypercholesteremia    Hypertension    Pre-diabetes     Past Surgical History:  Procedure Laterality Date   PROSTATE SURGERY     SKIN BIOPSY     basel cell    Current Medications: Current Meds  Medication Sig   acetaminophen (TYLENOL) 500 MG tablet Take 1 tablet (500 mg total) by mouth every 6 (six) hours as needed.   Cholecalciferol (VITAMIN D) 2000 units CAPS Take 1 capsule (2,000 Units total) by mouth daily.   Cyanocobalamin (B-12) 500 MCG TABS Take 1 tablet by mouth daily.   metoprolol succinate (TOPROL XL) 25 MG 24 hr tablet  Take 1 tablet (25 mg total) by mouth daily.   Triamcinolone Acetonide (NASACORT ALLERGY 24HR NA) Place into the nose.   [DISCONTINUED] apixaban (ELIQUIS) 5 MG TABS tablet Take 1 tablet (5 mg total) by mouth 2 (two) times daily.   [DISCONTINUED] atorvastatin (LIPITOR) 40 MG tablet Take 1 tablet (40 mg total) by mouth daily.   [DISCONTINUED] carvedilol (COREG) 3.125 MG tablet Take 1 tablet (3.125 mg total) by mouth 2 (two) times daily.   [DISCONTINUED] lisinopril (ZESTRIL) 20 MG tablet Take 20 mg by mouth daily.     Allergies:   Penicillin v potassium and Penicillins   Social History   Socioeconomic History   Marital status: Married    Spouse name: Not on file   Number of children: Not on file   Years of education: Not on file   Highest education level: Not on file  Occupational History   Not on file  Tobacco Use   Smoking status: Former    Types: Cigars    Quit date: 5    Years since quitting: 30.7   Smokeless tobacco: Never  Vaping Use   Vaping status: Never Used  Substance and Sexual Activity   Alcohol use: Yes    Comment: occasional/2 beers a month   Drug use: Never   Sexual activity: Yes  Other Topics Concern   Not on file  Social History Narrative   Not on file   Social Determinants of Health  Financial Resource Strain: Low Risk  (01/25/2023)   Received from [facility]   Overall Financial Resource Strain (CARDIA)    Difficulty of Paying Living Expenses: Not hard at all  Food Insecurity: No Food Insecurity (03/02/2023)   Hunger Vital Sign    Worried About Running Out of Food in the Last Year: Never true    Ran Out of Food in the Last Year: Never true  Transportation Needs: No Transportation Needs (03/02/2023)   PRAPARE - Administrator, Civil Service (Medical): No    Lack of Transportation (Non-Medical): No  Physical Activity: Not on file  Stress: Not on file  Social Connections: Not on file     Family History: The patient's  family history includes Heart disease in his maternal grandfather and maternal uncle.  ROS:   Please see the history of present illness.     All other systems reviewed and are negative.  EKGs/Labs/Other Studies Reviewed:    The following studies were reviewed today:  EKG Interpretation Date/Time:  Thursday March 07 2023 09:18:48 EDT Ventricular Rate:  58 PR Interval:  216 QRS Duration:  108 QT Interval:  400 QTC Calculation: 392 R Axis:   0  Text Interpretation: Sinus bradycardia with 1st degree A-V block Incomplete right bundle branch block Confirmed by Debbe Odea ([id number]) on 03/07/2023 9:34:20 AM    Recent Labs: 03/01/2023: ALT 14; BUN 27; Creatinine, Ser 1.39; Hemoglobin 12.2; Platelets 232; Potassium 3.9; Sodium 138  Recent Lipid Panel    Component Value Date/Time   CHOL 179 03/02/2023 0420   CHOL 250 (H) 07/01/2017 0951   TRIG 167 (H) 03/02/2023 0420   HDL 46 03/02/2023 0420   HDL 50 07/01/2017 0951   CHOLHDL 3.9 03/02/2023 0420   VLDL 33 03/02/2023 0420   LDLCALC 100 (H) 03/02/2023 0420   LDLCALC 166 (H) 07/01/2017 0951     Risk Assessment/Calculations:             Physical Exam:    VS:  BP 128/70 (BP Location: Right Arm, Patient Position: Sitting, Cuff Size: Large)   Pulse (!) 58   Ht 5\' 9"  (1.753 m)   Wt 215 lb 9.6 oz (97.8 kg)   SpO2 99%   BMI 31.84 kg/m     Wt Readings from Last 3 Encounters:  03/07/23 215 lb 9.6 oz (97.8 kg)  03/01/23 220 lb 0.3 oz (99.8 kg)  02/08/23 215 lb (97.5 kg)     GEN:  Well nourished, well developed in no acute distress HEENT: Normal NECK: No JVD; No carotid bruits CARDIAC: RRR, no murmurs, rubs, gallops RESPIRATORY:  Clear to auscultation without rales, wheezing or rhonchi  ABDOMEN: Soft, non-tender, non-distended MUSCULOSKELETAL:  trace edema; No deformity  SKIN: Warm and dry NEUROLOGIC:  Alert and oriented x 3 PSYCHIATRIC:  Normal affect   ASSESSMENT:    1. Paroxysmal atrial fibrillation (HCC)    2. Primary hypertension   3. Bilateral carotid artery stenosis    PLAN:    In order of problems listed above:  Paroxysmal atrial fibrillation, recent stroke.  Continue Eliquis, start Toprol-XL 25 mg daily.  Stop Coreg. Hypertension, BP controlled, continue lisinopril 20 mg daily, Toprol-XL 25 mg daily.  EF 60%, moderate LAE. Moderate bilateral carotid artery stenosis, Eliquis, Lipitor 40 mg daily.  Refer to vascular surgery for continued monitoring.  Follow-up in 4 to 5 months     Medication Adjustments/Labs and Tests Ordered: Current medicines are reviewed at length with the  patient today.  Concerns regarding medicines are outlined above.  Orders Placed This Encounter  Procedures   Ambulatory referral to Vascular Surgery   EKG 12-Lead   Meds ordered this encounter  Medications   metoprolol succinate (TOPROL XL) 25 MG 24 hr tablet    Sig: Take 1 tablet (25 mg total) by mouth daily.    Dispense:  90 tablet    Refill:  3   lisinopril (ZESTRIL) 20 MG tablet    Sig: Take 1 tablet (20 mg total) by mouth daily.    Dispense:  90 tablet    Refill:  3   apixaban (ELIQUIS) 5 MG TABS tablet    Sig: Take 1 tablet (5 mg total) by mouth 2 (two) times daily.    Dispense:  120 tablet    Refill:  3   atorvastatin (LIPITOR) 40 MG tablet    Sig: Take 1 tablet (40 mg total) by mouth daily.    Dispense:  90 tablet    Refill:  3    Patient Instructions  Medication Instructions:   STOP Carvedilol START Metoprolol Succinate - Take one tablet (25mg ) daily.   *If you need a refill on your cardiac medications before your next appointment, please call your pharmacy*   Lab Work:  None Ordered  If you have labs (blood work) drawn today and your tests are completely normal, you will receive your results only by: MyChart Message (if you have MyChart) OR A paper copy in the mail If you have any lab test that is abnormal or we need to change your treatment, we will call you to review the  results.   Testing/Procedures:  .NOne Ordered   Follow-Up: At [facility], you and your health needs are our priority.  As part of our continuing mission to provide you with exceptional heart care, we have created designated Provider Care Teams.  These Care Teams include your primary Cardiologist (physician) and Advanced Practice Providers (APPs -  Physician Assistants and Nurse Practitioners) who all work together to provide you with the care you need, when you need it.  We recommend signing up for the patient portal called "MyChart".  Sign up information is provided on this After Visit Summary.  MyChart is used to connect with patients for Virtual Visits (Telemedicine).  Patients are able to view lab/test results, encounter notes, upcoming appointments, etc.  Non-urgent messages can be sent to your provider as well.   To learn more about what you can do with MyChart, go to [email or web address].    Your next appointment:   6 month(s)  Provider:   You may see Debbe Odea, MD or one of the following Advanced Practice Providers on your designated Care Team:   Nicolasa Ducking, NP Eula Listen, PA-C Cadence Fransico Michael, PA-C Charlsie Quest, NP    Signed, Debbe Odea, MD  03/07/2023 10:21 AM    [facility]

## 2023-03-07 NOTE — Patient Instructions (Signed)
Medication Instructions:   STOP Carvedilol START Metoprolol Succinate - Take one tablet (25mg ) daily.   *If you need a refill on your cardiac medications before your next appointment, please call your pharmacy*   Lab Work:  None Ordered  If you have labs (blood work) drawn today and your tests are completely normal, you will receive your results only by: MyChart Message (if you have MyChart) OR A paper copy in the mail If you have any lab test that is abnormal or we need to change your treatment, we will call you to review the results.   Testing/Procedures:  .NOne Ordered   Follow-Up: At [facility], you and your health needs are our priority.  As part of our continuing mission to provide you with exceptional heart care, we have created designated Provider Care Teams.  These Care Teams include your primary Cardiologist (physician) and Advanced Practice Providers (APPs -  Physician Assistants and Nurse Practitioners) who all work together to provide you with the care you need, when you need it.  We recommend signing up for the patient portal called "MyChart".  Sign up information is provided on this After Visit Summary.  MyChart is used to connect with patients for Virtual Visits (Telemedicine).  Patients are able to view lab/test results, encounter notes, upcoming appointments, etc.  Non-urgent messages can be sent to your provider as well.   To learn more about what you can do with MyChart, go to [email or web address].    Your next appointment:   6 month(s)  Provider:   You may see Debbe Odea, MD or one of the following Advanced Practice Providers on your designated Care Team:   Nicolasa Ducking, NP Eula Listen, PA-C Cadence Fransico Michael, PA-C Charlsie Quest, NP

## 2023-03-14 ENCOUNTER — Telehealth: Payer: Self-pay | Admitting: Cardiology

## 2023-03-14 NOTE — Telephone Encounter (Signed)
*  STAT* If patient is at the pharmacy, call can be transferred to refill team.   1. Which medications need to be refilled? (please list name of each medication and dose if known) atorvastatin (LIPITOR) 40 MG tablet    2. Would you like to learn more about the convenience, safety, & potential cost savings by using the [facility]? No      3. Are you open to using the [facility] (Type [facility]. No).   4. Which pharmacy/location (including street and city if local pharmacy) is medication to be sent to? [id number] - [place], [place] - [address]   5. Do they need a 30 day or 90 day supply? 90

## 2023-03-14 NOTE — Telephone Encounter (Signed)
Disp Refills Start End   atorvastatin (LIPITOR) 40 MG tablet 90 tablet 3 03/07/2023 --   Sig - Route: Take 1 tablet (40 mg total) by mouth daily. - Oral   Sent to pharmacy as: atorvastatin (LIPITOR) 40 MG tablet   E-Prescribing Status: Receipt confirmed by pharmacy (03/07/2023 10:02 AM EDT)

## 2023-03-22 ENCOUNTER — Encounter (INDEPENDENT_AMBULATORY_CARE_PROVIDER_SITE_OTHER): Payer: Self-pay | Admitting: Vascular Surgery

## 2023-03-22 ENCOUNTER — Ambulatory Visit (INDEPENDENT_AMBULATORY_CARE_PROVIDER_SITE_OTHER): Payer: Medicare HMO | Admitting: Vascular Surgery

## 2023-03-22 VITALS — BP 125/69 | HR 56 | Resp 18 | Ht 69.0 in | Wt 221.0 lb

## 2023-03-22 DIAGNOSIS — R7303 Prediabetes: Secondary | ICD-10-CM | POA: Diagnosis not present

## 2023-03-22 DIAGNOSIS — I10 Essential (primary) hypertension: Secondary | ICD-10-CM

## 2023-03-22 DIAGNOSIS — I83891 Varicose veins of right lower extremities with other complications: Secondary | ICD-10-CM

## 2023-03-22 DIAGNOSIS — I6529 Occlusion and stenosis of unspecified carotid artery: Secondary | ICD-10-CM | POA: Insufficient documentation

## 2023-03-22 DIAGNOSIS — I6523 Occlusion and stenosis of bilateral carotid arteries: Secondary | ICD-10-CM

## 2023-03-22 DIAGNOSIS — E785 Hyperlipidemia, unspecified: Secondary | ICD-10-CM

## 2023-03-22 NOTE — Assessment & Plan Note (Signed)
lipid control important in reducing the progression of atherosclerotic disease. Continue statin therapy  

## 2023-03-22 NOTE — Assessment & Plan Note (Signed)
blood pressure control important in reducing the progression of atherosclerotic disease. On appropriate oral medications.  

## 2023-03-22 NOTE — Assessment & Plan Note (Addendum)
Carotid atherosclerosis with no >50% stenosis by duplex or MRA (which tends to overestimate the degree of stenosis). On Eliquis and lipitor. No role for intervention at this time. RTC with carotid duplex in 3-6 months.

## 2023-03-22 NOTE — Assessment & Plan Note (Signed)
Has had previous venous procedure for symptomatic varicosities of the right leg many years ago.  At this point, when he returns with his carotid duplex in several months, we will get a venous reflux study as well to assess the anatomy and determine further treatment options.  He will continue wearing compression socks, elevating his legs, and remaining active.

## 2023-03-22 NOTE — Assessment & Plan Note (Signed)
blood glucose control important in reducing the progression of atherosclerotic disease. Also, involved in wound healing. Not on medications.

## 2023-03-22 NOTE — Progress Notes (Signed)
Patient ID: Jonathan [name], male   DOB: [birth year], 89 y.o.   MRN: [id number]  Chief Complaint  Patient presents with   Establish Care    HPI Jonathan [name] is a 89 y.o. male.  I am asked to see the patient by Dr. Azucena Cecil for evaluation of carotid artery stenosis.  The patient recently had a small stroke while he was playing golf.  He is incredibly vigorous and active for his age and continued to play his round despite the stroke.  He is now on Eliquis and Lipitor.  No recurrent issues and he continues to be active and vigorous.  He had both a carotid duplex and an MR angiogram which I have reviewed.  The duplex showed calcific plaque in each carotid artery but less than 50% stenosis.  The MR angiogram showed only very mild narrowing of what appeared to be less than 25% in both carotid arteries.   He also has varicose veins of the right leg with some pain and edema.  He has what sounds like either a vein stripping or laser ablation many years ago.  This was all on the right leg.  He does not really have any symptoms on the left leg.  He has a large prominent varicosity that tracks on the anterior and lateral thigh across the knee.  He has compression socks which provide some relief on the swelling but he still has pain with this.  He does try to elevate his legs and he remains very active.   Past Medical History:  Diagnosis Date   Cancer (HCC)    prostate   Hypercholesteremia    Hypertension    Pre-diabetes    Stroke [facility])     Past Surgical History:  Procedure Laterality Date   PROSTATE SURGERY     SKIN BIOPSY     basel cell     Family History  Problem Relation Age of Onset   Heart disease Maternal Uncle    Heart disease Maternal Grandfather   No bleeding or clotting disorders No aneurysms   Social History   Tobacco Use   Smoking status: Former    Types: Cigars    Quit date: 1994    Years since quitting: 30.7   Smokeless tobacco: Never  Vaping Use   Vaping  status: Never Used  Substance Use Topics   Alcohol use: Yes    Comment: occasional/2 beers a month   Drug use: Never     Allergies  Allergen Reactions   Penicillin V Potassium Other (See Comments)   Penicillins Rash    Current Outpatient Medications  Medication Sig Dispense Refill   acetaminophen (TYLENOL) 500 MG tablet Take 1 tablet (500 mg total) by mouth every 6 (six) hours as needed. 30 tablet 0   apixaban (ELIQUIS) 5 MG TABS tablet Take 1 tablet (5 mg total) by mouth 2 (two) times daily. 120 tablet 3   atorvastatin (LIPITOR) 40 MG tablet Take 1 tablet (40 mg total) by mouth daily. 90 tablet 3   Cholecalciferol (VITAMIN D) 2000 units CAPS Take 1 capsule (2,000 Units total) by mouth daily. 30 capsule    Cyanocobalamin (B-12) 500 MCG TABS Take 1 tablet by mouth daily. 150 tablet    lisinopril (ZESTRIL) 20 MG tablet Take 1 tablet (20 mg total) by mouth daily. 90 tablet 3   metoprolol succinate (TOPROL XL) 25 MG 24 hr tablet Take 1 tablet (25 mg total) by mouth daily. 90 tablet 3   Triamcinolone  Acetonide (NASACORT ALLERGY 24HR NA) Place into the nose.     No current facility-administered medications for this visit.      REVIEW OF SYSTEMS (Negative unless checked)  Constitutional: [] Weight loss  [] Fever  [] Chills Cardiac: [] Chest pain   [] Chest pressure   [] Palpitations   [] Shortness of breath when laying flat   [] Shortness of breath at rest   [] Shortness of breath with exertion. Vascular:  [] Pain in legs with walking   [] Pain in legs at rest   [] Pain in legs when laying flat   [] Claudication   [] Pain in feet when walking  [] Pain in feet at rest  [] Pain in feet when laying flat   [] History of DVT   [] Phlebitis   [] Swelling in legs   [] Varicose veins   [] Non-healing ulcers Pulmonary:   [] Uses home oxygen   [] Productive cough   [] Hemoptysis   [] Wheeze  [] COPD   [] Asthma Neurologic:  [] Dizziness  [] Blackouts   [] Seizures   [x] History of stroke   [] History of TIA  [] Aphasia    [] Temporary blindness   [] Dysphagia   [] Weakness or numbness in arms   [] Weakness or numbness in legs Musculoskeletal:  [x] Arthritis   [] Joint swelling   [x] Joint pain   [] Low back pain Hematologic:  [] Easy bruising  [] Easy bleeding   [] Hypercoagulable state   [] Anemic  [] Hepatitis Gastrointestinal:  [] Blood in stool   [] Vomiting blood  [] Gastroesophageal reflux/heartburn   [] Abdominal pain Genitourinary:  [] Chronic kidney disease   [] Difficult urination  [] Frequent urination  [] Burning with urination   [] Hematuria Skin:  [] Rashes   [] Ulcers   [] Wounds Psychological:  [] History of anxiety   []  History of major depression.    Physical Exam BP 125/69 (BP Location: Right Arm)   Pulse (!) 56   Resp 18   Ht 5\' 9"  (1.753 m)   Wt 221 lb (100.2 kg)   BMI 32.64 kg/m  Gen:  WD/WN, NAD. Appears younger than stated age. Head: [place]/AT, No temporalis wasting. Ear/Nose/Throat: Hearing grossly intact, nares w/o erythema or drainage, oropharynx w/o Erythema/Exudate Eyes: Conjunctiva clear, sclera non-icteric  Neck: trachea midline.  No JVD.  Pulmonary:  Good air movement, respirations not labored, no use of accessory muscles  Cardiac: Bradycardic Vascular:  Vessel Right Left  Radial Palpable Palpable                                   Gastrointestinal:. No masses, surgical incisions, or scars. Musculoskeletal: M/S 5/5 throughout.  Extremities without ischemic changes.  No deformity or atrophy. No edema. Neurologic: Sensation grossly intact in extremities.  Symmetrical.  Speech is fluent. Motor exam as listed above. Psychiatric: Judgment intact, Mood & affect appropriate for pt's clinical situation. Dermatologic: No rashes or ulcers noted.  No cellulitis or open wounds.    Radiology ECHOCARDIOGRAM COMPLETE  Result Date: 03/02/2023    ECHOCARDIOGRAM REPORT   Patient Name:   Jonathan [name] Date of Exam: 03/02/2023 Medical Rec #:  [id number]      Height:       69.0 in Accession #:    [id number]      Weight:       220.0 lb Date of Birth:  28-Aug-1933      BSA:          2.151 m Patient Age:    89 years       BP:  Patient ID: Jonathan [name], male   DOB: [birth year], 89 y.o.   MRN: [id number]  Chief Complaint  Patient presents with   Establish Care    HPI Jonathan [name] is a 89 y.o. male.  I am asked to see the patient by Dr. Azucena Cecil for evaluation of carotid artery stenosis.  The patient recently had a small stroke while he was playing golf.  He is incredibly vigorous and active for his age and continued to play his round despite the stroke.  He is now on Eliquis and Lipitor.  No recurrent issues and he continues to be active and vigorous.  He had both a carotid duplex and an MR angiogram which I have reviewed.  The duplex showed calcific plaque in each carotid artery but less than 50% stenosis.  The MR angiogram showed only very mild narrowing of what appeared to be less than 25% in both carotid arteries.   He also has varicose veins of the right leg with some pain and edema.  He has what sounds like either a vein stripping or laser ablation many years ago.  This was all on the right leg.  He does not really have any symptoms on the left leg.  He has a large prominent varicosity that tracks on the anterior and lateral thigh across the knee.  He has compression socks which provide some relief on the swelling but he still has pain with this.  He does try to elevate his legs and he remains very active.   Past Medical History:  Diagnosis Date   Cancer (HCC)    prostate   Hypercholesteremia    Hypertension    Pre-diabetes    Stroke [facility])     Past Surgical History:  Procedure Laterality Date   PROSTATE SURGERY     SKIN BIOPSY     basel cell     Family History  Problem Relation Age of Onset   Heart disease Maternal Uncle    Heart disease Maternal Grandfather   No bleeding or clotting disorders No aneurysms   Social History   Tobacco Use   Smoking status: Former    Types: Cigars    Quit date: 1994    Years since quitting: 30.7   Smokeless tobacco: Never  Vaping Use   Vaping  status: Never Used  Substance Use Topics   Alcohol use: Yes    Comment: occasional/2 beers a month   Drug use: Never     Allergies  Allergen Reactions   Penicillin V Potassium Other (See Comments)   Penicillins Rash    Current Outpatient Medications  Medication Sig Dispense Refill   acetaminophen (TYLENOL) 500 MG tablet Take 1 tablet (500 mg total) by mouth every 6 (six) hours as needed. 30 tablet 0   apixaban (ELIQUIS) 5 MG TABS tablet Take 1 tablet (5 mg total) by mouth 2 (two) times daily. 120 tablet 3   atorvastatin (LIPITOR) 40 MG tablet Take 1 tablet (40 mg total) by mouth daily. 90 tablet 3   Cholecalciferol (VITAMIN D) 2000 units CAPS Take 1 capsule (2,000 Units total) by mouth daily. 30 capsule    Cyanocobalamin (B-12) 500 MCG TABS Take 1 tablet by mouth daily. 150 tablet    lisinopril (ZESTRIL) 20 MG tablet Take 1 tablet (20 mg total) by mouth daily. 90 tablet 3   metoprolol succinate (TOPROL XL) 25 MG 24 hr tablet Take 1 tablet (25 mg total) by mouth daily. 90 tablet 3   Triamcinolone  Acetonide (NASACORT ALLERGY 24HR NA) Place into the nose.     No current facility-administered medications for this visit.      REVIEW OF SYSTEMS (Negative unless checked)  Constitutional: [] Weight loss  [] Fever  [] Chills Cardiac: [] Chest pain   [] Chest pressure   [] Palpitations   [] Shortness of breath when laying flat   [] Shortness of breath at rest   [] Shortness of breath with exertion. Vascular:  [] Pain in legs with walking   [] Pain in legs at rest   [] Pain in legs when laying flat   [] Claudication   [] Pain in feet when walking  [] Pain in feet at rest  [] Pain in feet when laying flat   [] History of DVT   [] Phlebitis   [] Swelling in legs   [] Varicose veins   [] Non-healing ulcers Pulmonary:   [] Uses home oxygen   [] Productive cough   [] Hemoptysis   [] Wheeze  [] COPD   [] Asthma Neurologic:  [] Dizziness  [] Blackouts   [] Seizures   [x] History of stroke   [] History of TIA  [] Aphasia    [] Temporary blindness   [] Dysphagia   [] Weakness or numbness in arms   [] Weakness or numbness in legs Musculoskeletal:  [x] Arthritis   [] Joint swelling   [x] Joint pain   [] Low back pain Hematologic:  [] Easy bruising  [] Easy bleeding   [] Hypercoagulable state   [] Anemic  [] Hepatitis Gastrointestinal:  [] Blood in stool   [] Vomiting blood  [] Gastroesophageal reflux/heartburn   [] Abdominal pain Genitourinary:  [] Chronic kidney disease   [] Difficult urination  [] Frequent urination  [] Burning with urination   [] Hematuria Skin:  [] Rashes   [] Ulcers   [] Wounds Psychological:  [] History of anxiety   []  History of major depression.    Physical Exam BP 125/69 (BP Location: Right Arm)   Pulse (!) 56   Resp 18   Ht 5\' 9"  (1.753 m)   Wt 221 lb (100.2 kg)   BMI 32.64 kg/m  Gen:  WD/WN, NAD. Appears younger than stated age. Head: [place]/AT, No temporalis wasting. Ear/Nose/Throat: Hearing grossly intact, nares w/o erythema or drainage, oropharynx w/o Erythema/Exudate Eyes: Conjunctiva clear, sclera non-icteric  Neck: trachea midline.  No JVD.  Pulmonary:  Good air movement, respirations not labored, no use of accessory muscles  Cardiac: Bradycardic Vascular:  Vessel Right Left  Radial Palpable Palpable                                   Gastrointestinal:. No masses, surgical incisions, or scars. Musculoskeletal: M/S 5/5 throughout.  Extremities without ischemic changes.  No deformity or atrophy. No edema. Neurologic: Sensation grossly intact in extremities.  Symmetrical.  Speech is fluent. Motor exam as listed above. Psychiatric: Judgment intact, Mood & affect appropriate for pt's clinical situation. Dermatologic: No rashes or ulcers noted.  No cellulitis or open wounds.    Radiology ECHOCARDIOGRAM COMPLETE  Result Date: 03/02/2023    ECHOCARDIOGRAM REPORT   Patient Name:   Jonathan [name] Date of Exam: 03/02/2023 Medical Rec #:  [id number]      Height:       69.0 in Accession #:    [id number]      Weight:       220.0 lb Date of Birth:  28-Aug-1933      BSA:          2.151 m Patient Age:    89 years       BP:  Patient ID: Jonathan [name], male   DOB: [birth year], 89 y.o.   MRN: [id number]  Chief Complaint  Patient presents with   Establish Care    HPI Jonathan [name] is a 89 y.o. male.  I am asked to see the patient by Dr. Azucena Cecil for evaluation of carotid artery stenosis.  The patient recently had a small stroke while he was playing golf.  He is incredibly vigorous and active for his age and continued to play his round despite the stroke.  He is now on Eliquis and Lipitor.  No recurrent issues and he continues to be active and vigorous.  He had both a carotid duplex and an MR angiogram which I have reviewed.  The duplex showed calcific plaque in each carotid artery but less than 50% stenosis.  The MR angiogram showed only very mild narrowing of what appeared to be less than 25% in both carotid arteries.   He also has varicose veins of the right leg with some pain and edema.  He has what sounds like either a vein stripping or laser ablation many years ago.  This was all on the right leg.  He does not really have any symptoms on the left leg.  He has a large prominent varicosity that tracks on the anterior and lateral thigh across the knee.  He has compression socks which provide some relief on the swelling but he still has pain with this.  He does try to elevate his legs and he remains very active.   Past Medical History:  Diagnosis Date   Cancer (HCC)    prostate   Hypercholesteremia    Hypertension    Pre-diabetes    Stroke [facility])     Past Surgical History:  Procedure Laterality Date   PROSTATE SURGERY     SKIN BIOPSY     basel cell     Family History  Problem Relation Age of Onset   Heart disease Maternal Uncle    Heart disease Maternal Grandfather   No bleeding or clotting disorders No aneurysms   Social History   Tobacco Use   Smoking status: Former    Types: Cigars    Quit date: 1994    Years since quitting: 30.7   Smokeless tobacco: Never  Vaping Use   Vaping  status: Never Used  Substance Use Topics   Alcohol use: Yes    Comment: occasional/2 beers a month   Drug use: Never     Allergies  Allergen Reactions   Penicillin V Potassium Other (See Comments)   Penicillins Rash    Current Outpatient Medications  Medication Sig Dispense Refill   acetaminophen (TYLENOL) 500 MG tablet Take 1 tablet (500 mg total) by mouth every 6 (six) hours as needed. 30 tablet 0   apixaban (ELIQUIS) 5 MG TABS tablet Take 1 tablet (5 mg total) by mouth 2 (two) times daily. 120 tablet 3   atorvastatin (LIPITOR) 40 MG tablet Take 1 tablet (40 mg total) by mouth daily. 90 tablet 3   Cholecalciferol (VITAMIN D) 2000 units CAPS Take 1 capsule (2,000 Units total) by mouth daily. 30 capsule    Cyanocobalamin (B-12) 500 MCG TABS Take 1 tablet by mouth daily. 150 tablet    lisinopril (ZESTRIL) 20 MG tablet Take 1 tablet (20 mg total) by mouth daily. 90 tablet 3   metoprolol succinate (TOPROL XL) 25 MG 24 hr tablet Take 1 tablet (25 mg total) by mouth daily. 90 tablet 3   Triamcinolone  Patient ID: Jonathan [name], male   DOB: [birth year], 89 y.o.   MRN: [id number]  Chief Complaint  Patient presents with   Establish Care    HPI Jonathan [name] is a 89 y.o. male.  I am asked to see the patient by Dr. Azucena Cecil for evaluation of carotid artery stenosis.  The patient recently had a small stroke while he was playing golf.  He is incredibly vigorous and active for his age and continued to play his round despite the stroke.  He is now on Eliquis and Lipitor.  No recurrent issues and he continues to be active and vigorous.  He had both a carotid duplex and an MR angiogram which I have reviewed.  The duplex showed calcific plaque in each carotid artery but less than 50% stenosis.  The MR angiogram showed only very mild narrowing of what appeared to be less than 25% in both carotid arteries.   He also has varicose veins of the right leg with some pain and edema.  He has what sounds like either a vein stripping or laser ablation many years ago.  This was all on the right leg.  He does not really have any symptoms on the left leg.  He has a large prominent varicosity that tracks on the anterior and lateral thigh across the knee.  He has compression socks which provide some relief on the swelling but he still has pain with this.  He does try to elevate his legs and he remains very active.   Past Medical History:  Diagnosis Date   Cancer (HCC)    prostate   Hypercholesteremia    Hypertension    Pre-diabetes    Stroke [facility])     Past Surgical History:  Procedure Laterality Date   PROSTATE SURGERY     SKIN BIOPSY     basel cell     Family History  Problem Relation Age of Onset   Heart disease Maternal Uncle    Heart disease Maternal Grandfather   No bleeding or clotting disorders No aneurysms   Social History   Tobacco Use   Smoking status: Former    Types: Cigars    Quit date: 1994    Years since quitting: 30.7   Smokeless tobacco: Never  Vaping Use   Vaping  status: Never Used  Substance Use Topics   Alcohol use: Yes    Comment: occasional/2 beers a month   Drug use: Never     Allergies  Allergen Reactions   Penicillin V Potassium Other (See Comments)   Penicillins Rash    Current Outpatient Medications  Medication Sig Dispense Refill   acetaminophen (TYLENOL) 500 MG tablet Take 1 tablet (500 mg total) by mouth every 6 (six) hours as needed. 30 tablet 0   apixaban (ELIQUIS) 5 MG TABS tablet Take 1 tablet (5 mg total) by mouth 2 (two) times daily. 120 tablet 3   atorvastatin (LIPITOR) 40 MG tablet Take 1 tablet (40 mg total) by mouth daily. 90 tablet 3   Cholecalciferol (VITAMIN D) 2000 units CAPS Take 1 capsule (2,000 Units total) by mouth daily. 30 capsule    Cyanocobalamin (B-12) 500 MCG TABS Take 1 tablet by mouth daily. 150 tablet    lisinopril (ZESTRIL) 20 MG tablet Take 1 tablet (20 mg total) by mouth daily. 90 tablet 3   metoprolol succinate (TOPROL XL) 25 MG 24 hr tablet Take 1 tablet (25 mg total) by mouth daily. 90 tablet 3   Triamcinolone  Patient ID: Jonathan [name], male   DOB: [birth year], 89 y.o.   MRN: [id number]  Chief Complaint  Patient presents with   Establish Care    HPI Jonathan [name] is a 89 y.o. male.  I am asked to see the patient by Dr. Azucena Cecil for evaluation of carotid artery stenosis.  The patient recently had a small stroke while he was playing golf.  He is incredibly vigorous and active for his age and continued to play his round despite the stroke.  He is now on Eliquis and Lipitor.  No recurrent issues and he continues to be active and vigorous.  He had both a carotid duplex and an MR angiogram which I have reviewed.  The duplex showed calcific plaque in each carotid artery but less than 50% stenosis.  The MR angiogram showed only very mild narrowing of what appeared to be less than 25% in both carotid arteries.   He also has varicose veins of the right leg with some pain and edema.  He has what sounds like either a vein stripping or laser ablation many years ago.  This was all on the right leg.  He does not really have any symptoms on the left leg.  He has a large prominent varicosity that tracks on the anterior and lateral thigh across the knee.  He has compression socks which provide some relief on the swelling but he still has pain with this.  He does try to elevate his legs and he remains very active.   Past Medical History:  Diagnosis Date   Cancer (HCC)    prostate   Hypercholesteremia    Hypertension    Pre-diabetes    Stroke [facility])     Past Surgical History:  Procedure Laterality Date   PROSTATE SURGERY     SKIN BIOPSY     basel cell     Family History  Problem Relation Age of Onset   Heart disease Maternal Uncle    Heart disease Maternal Grandfather   No bleeding or clotting disorders No aneurysms   Social History   Tobacco Use   Smoking status: Former    Types: Cigars    Quit date: 1994    Years since quitting: 30.7   Smokeless tobacco: Never  Vaping Use   Vaping  status: Never Used  Substance Use Topics   Alcohol use: Yes    Comment: occasional/2 beers a month   Drug use: Never     Allergies  Allergen Reactions   Penicillin V Potassium Other (See Comments)   Penicillins Rash    Current Outpatient Medications  Medication Sig Dispense Refill   acetaminophen (TYLENOL) 500 MG tablet Take 1 tablet (500 mg total) by mouth every 6 (six) hours as needed. 30 tablet 0   apixaban (ELIQUIS) 5 MG TABS tablet Take 1 tablet (5 mg total) by mouth 2 (two) times daily. 120 tablet 3   atorvastatin (LIPITOR) 40 MG tablet Take 1 tablet (40 mg total) by mouth daily. 90 tablet 3   Cholecalciferol (VITAMIN D) 2000 units CAPS Take 1 capsule (2,000 Units total) by mouth daily. 30 capsule    Cyanocobalamin (B-12) 500 MCG TABS Take 1 tablet by mouth daily. 150 tablet    lisinopril (ZESTRIL) 20 MG tablet Take 1 tablet (20 mg total) by mouth daily. 90 tablet 3   metoprolol succinate (TOPROL XL) 25 MG 24 hr tablet Take 1 tablet (25 mg total) by mouth daily. 90 tablet 3   Triamcinolone  Patient ID: Jonathan [name], male   DOB: [birth year], 89 y.o.   MRN: [id number]  Chief Complaint  Patient presents with   Establish Care    HPI Jonathan [name] is a 89 y.o. male.  I am asked to see the patient by Dr. Azucena Cecil for evaluation of carotid artery stenosis.  The patient recently had a small stroke while he was playing golf.  He is incredibly vigorous and active for his age and continued to play his round despite the stroke.  He is now on Eliquis and Lipitor.  No recurrent issues and he continues to be active and vigorous.  He had both a carotid duplex and an MR angiogram which I have reviewed.  The duplex showed calcific plaque in each carotid artery but less than 50% stenosis.  The MR angiogram showed only very mild narrowing of what appeared to be less than 25% in both carotid arteries.   He also has varicose veins of the right leg with some pain and edema.  He has what sounds like either a vein stripping or laser ablation many years ago.  This was all on the right leg.  He does not really have any symptoms on the left leg.  He has a large prominent varicosity that tracks on the anterior and lateral thigh across the knee.  He has compression socks which provide some relief on the swelling but he still has pain with this.  He does try to elevate his legs and he remains very active.   Past Medical History:  Diagnosis Date   Cancer (HCC)    prostate   Hypercholesteremia    Hypertension    Pre-diabetes    Stroke [facility])     Past Surgical History:  Procedure Laterality Date   PROSTATE SURGERY     SKIN BIOPSY     basel cell     Family History  Problem Relation Age of Onset   Heart disease Maternal Uncle    Heart disease Maternal Grandfather   No bleeding or clotting disorders No aneurysms   Social History   Tobacco Use   Smoking status: Former    Types: Cigars    Quit date: 1994    Years since quitting: 30.7   Smokeless tobacco: Never  Vaping Use   Vaping  status: Never Used  Substance Use Topics   Alcohol use: Yes    Comment: occasional/2 beers a month   Drug use: Never     Allergies  Allergen Reactions   Penicillin V Potassium Other (See Comments)   Penicillins Rash    Current Outpatient Medications  Medication Sig Dispense Refill   acetaminophen (TYLENOL) 500 MG tablet Take 1 tablet (500 mg total) by mouth every 6 (six) hours as needed. 30 tablet 0   apixaban (ELIQUIS) 5 MG TABS tablet Take 1 tablet (5 mg total) by mouth 2 (two) times daily. 120 tablet 3   atorvastatin (LIPITOR) 40 MG tablet Take 1 tablet (40 mg total) by mouth daily. 90 tablet 3   Cholecalciferol (VITAMIN D) 2000 units CAPS Take 1 capsule (2,000 Units total) by mouth daily. 30 capsule    Cyanocobalamin (B-12) 500 MCG TABS Take 1 tablet by mouth daily. 150 tablet    lisinopril (ZESTRIL) 20 MG tablet Take 1 tablet (20 mg total) by mouth daily. 90 tablet 3   metoprolol succinate (TOPROL XL) 25 MG 24 hr tablet Take 1 tablet (25 mg total) by mouth daily. 90 tablet 3   Triamcinolone  Acetonide (NASACORT ALLERGY 24HR NA) Place into the nose.     No current facility-administered medications for this visit.      REVIEW OF SYSTEMS (Negative unless checked)  Constitutional: [] Weight loss  [] Fever  [] Chills Cardiac: [] Chest pain   [] Chest pressure   [] Palpitations   [] Shortness of breath when laying flat   [] Shortness of breath at rest   [] Shortness of breath with exertion. Vascular:  [] Pain in legs with walking   [] Pain in legs at rest   [] Pain in legs when laying flat   [] Claudication   [] Pain in feet when walking  [] Pain in feet at rest  [] Pain in feet when laying flat   [] History of DVT   [] Phlebitis   [] Swelling in legs   [] Varicose veins   [] Non-healing ulcers Pulmonary:   [] Uses home oxygen   [] Productive cough   [] Hemoptysis   [] Wheeze  [] COPD   [] Asthma Neurologic:  [] Dizziness  [] Blackouts   [] Seizures   [x] History of stroke   [] History of TIA  [] Aphasia    [] Temporary blindness   [] Dysphagia   [] Weakness or numbness in arms   [] Weakness or numbness in legs Musculoskeletal:  [x] Arthritis   [] Joint swelling   [x] Joint pain   [] Low back pain Hematologic:  [] Easy bruising  [] Easy bleeding   [] Hypercoagulable state   [] Anemic  [] Hepatitis Gastrointestinal:  [] Blood in stool   [] Vomiting blood  [] Gastroesophageal reflux/heartburn   [] Abdominal pain Genitourinary:  [] Chronic kidney disease   [] Difficult urination  [] Frequent urination  [] Burning with urination   [] Hematuria Skin:  [] Rashes   [] Ulcers   [] Wounds Psychological:  [] History of anxiety   []  History of major depression.    Physical Exam BP 125/69 (BP Location: Right Arm)   Pulse (!) 56   Resp 18   Ht 5\' 9"  (1.753 m)   Wt 221 lb (100.2 kg)   BMI 32.64 kg/m  Gen:  WD/WN, NAD. Appears younger than stated age. Head: [place]/AT, No temporalis wasting. Ear/Nose/Throat: Hearing grossly intact, nares w/o erythema or drainage, oropharynx w/o Erythema/Exudate Eyes: Conjunctiva clear, sclera non-icteric  Neck: trachea midline.  No JVD.  Pulmonary:  Good air movement, respirations not labored, no use of accessory muscles  Cardiac: Bradycardic Vascular:  Vessel Right Left  Radial Palpable Palpable                                   Gastrointestinal:. No masses, surgical incisions, or scars. Musculoskeletal: M/S 5/5 throughout.  Extremities without ischemic changes.  No deformity or atrophy. No edema. Neurologic: Sensation grossly intact in extremities.  Symmetrical.  Speech is fluent. Motor exam as listed above. Psychiatric: Judgment intact, Mood & affect appropriate for pt's clinical situation. Dermatologic: No rashes or ulcers noted.  No cellulitis or open wounds.    Radiology ECHOCARDIOGRAM COMPLETE  Result Date: 03/02/2023    ECHOCARDIOGRAM REPORT   Patient Name:   Jonathan [name] Date of Exam: 03/02/2023 Medical Rec #:  [id number]      Height:       69.0 in Accession #:    [id number]      Weight:       220.0 lb Date of Birth:  28-Aug-1933      BSA:          2.151 m Patient Age:    89 years       BP:  Acetonide (NASACORT ALLERGY 24HR NA) Place into the nose.     No current facility-administered medications for this visit.      REVIEW OF SYSTEMS (Negative unless checked)  Constitutional: [] Weight loss  [] Fever  [] Chills Cardiac: [] Chest pain   [] Chest pressure   [] Palpitations   [] Shortness of breath when laying flat   [] Shortness of breath at rest   [] Shortness of breath with exertion. Vascular:  [] Pain in legs with walking   [] Pain in legs at rest   [] Pain in legs when laying flat   [] Claudication   [] Pain in feet when walking  [] Pain in feet at rest  [] Pain in feet when laying flat   [] History of DVT   [] Phlebitis   [] Swelling in legs   [] Varicose veins   [] Non-healing ulcers Pulmonary:   [] Uses home oxygen   [] Productive cough   [] Hemoptysis   [] Wheeze  [] COPD   [] Asthma Neurologic:  [] Dizziness  [] Blackouts   [] Seizures   [x] History of stroke   [] History of TIA  [] Aphasia    [] Temporary blindness   [] Dysphagia   [] Weakness or numbness in arms   [] Weakness or numbness in legs Musculoskeletal:  [x] Arthritis   [] Joint swelling   [x] Joint pain   [] Low back pain Hematologic:  [] Easy bruising  [] Easy bleeding   [] Hypercoagulable state   [] Anemic  [] Hepatitis Gastrointestinal:  [] Blood in stool   [] Vomiting blood  [] Gastroesophageal reflux/heartburn   [] Abdominal pain Genitourinary:  [] Chronic kidney disease   [] Difficult urination  [] Frequent urination  [] Burning with urination   [] Hematuria Skin:  [] Rashes   [] Ulcers   [] Wounds Psychological:  [] History of anxiety   []  History of major depression.    Physical Exam BP 125/69 (BP Location: Right Arm)   Pulse (!) 56   Resp 18   Ht 5\' 9"  (1.753 m)   Wt 221 lb (100.2 kg)   BMI 32.64 kg/m  Gen:  WD/WN, NAD. Appears younger than stated age. Head: [place]/AT, No temporalis wasting. Ear/Nose/Throat: Hearing grossly intact, nares w/o erythema or drainage, oropharynx w/o Erythema/Exudate Eyes: Conjunctiva clear, sclera non-icteric  Neck: trachea midline.  No JVD.  Pulmonary:  Good air movement, respirations not labored, no use of accessory muscles  Cardiac: Bradycardic Vascular:  Vessel Right Left  Radial Palpable Palpable                                   Gastrointestinal:. No masses, surgical incisions, or scars. Musculoskeletal: M/S 5/5 throughout.  Extremities without ischemic changes.  No deformity or atrophy. No edema. Neurologic: Sensation grossly intact in extremities.  Symmetrical.  Speech is fluent. Motor exam as listed above. Psychiatric: Judgment intact, Mood & affect appropriate for pt's clinical situation. Dermatologic: No rashes or ulcers noted.  No cellulitis or open wounds.    Radiology ECHOCARDIOGRAM COMPLETE  Result Date: 03/02/2023    ECHOCARDIOGRAM REPORT   Patient Name:   Jonathan [name] Date of Exam: 03/02/2023 Medical Rec #:  [id number]      Height:       69.0 in Accession #:    [id number]      Weight:       220.0 lb Date of Birth:  28-Aug-1933      BSA:          2.151 m Patient Age:    89 years       BP:  Patient ID: Jonathan [name], male   DOB: [birth year], 89 y.o.   MRN: [id number]  Chief Complaint  Patient presents with   Establish Care    HPI Jonathan [name] is a 89 y.o. male.  I am asked to see the patient by Dr. Azucena Cecil for evaluation of carotid artery stenosis.  The patient recently had a small stroke while he was playing golf.  He is incredibly vigorous and active for his age and continued to play his round despite the stroke.  He is now on Eliquis and Lipitor.  No recurrent issues and he continues to be active and vigorous.  He had both a carotid duplex and an MR angiogram which I have reviewed.  The duplex showed calcific plaque in each carotid artery but less than 50% stenosis.  The MR angiogram showed only very mild narrowing of what appeared to be less than 25% in both carotid arteries.   He also has varicose veins of the right leg with some pain and edema.  He has what sounds like either a vein stripping or laser ablation many years ago.  This was all on the right leg.  He does not really have any symptoms on the left leg.  He has a large prominent varicosity that tracks on the anterior and lateral thigh across the knee.  He has compression socks which provide some relief on the swelling but he still has pain with this.  He does try to elevate his legs and he remains very active.   Past Medical History:  Diagnosis Date   Cancer (HCC)    prostate   Hypercholesteremia    Hypertension    Pre-diabetes    Stroke [facility])     Past Surgical History:  Procedure Laterality Date   PROSTATE SURGERY     SKIN BIOPSY     basel cell     Family History  Problem Relation Age of Onset   Heart disease Maternal Uncle    Heart disease Maternal Grandfather   No bleeding or clotting disorders No aneurysms   Social History   Tobacco Use   Smoking status: Former    Types: Cigars    Quit date: 1994    Years since quitting: 30.7   Smokeless tobacco: Never  Vaping Use   Vaping  status: Never Used  Substance Use Topics   Alcohol use: Yes    Comment: occasional/2 beers a month   Drug use: Never     Allergies  Allergen Reactions   Penicillin V Potassium Other (See Comments)   Penicillins Rash    Current Outpatient Medications  Medication Sig Dispense Refill   acetaminophen (TYLENOL) 500 MG tablet Take 1 tablet (500 mg total) by mouth every 6 (six) hours as needed. 30 tablet 0   apixaban (ELIQUIS) 5 MG TABS tablet Take 1 tablet (5 mg total) by mouth 2 (two) times daily. 120 tablet 3   atorvastatin (LIPITOR) 40 MG tablet Take 1 tablet (40 mg total) by mouth daily. 90 tablet 3   Cholecalciferol (VITAMIN D) 2000 units CAPS Take 1 capsule (2,000 Units total) by mouth daily. 30 capsule    Cyanocobalamin (B-12) 500 MCG TABS Take 1 tablet by mouth daily. 150 tablet    lisinopril (ZESTRIL) 20 MG tablet Take 1 tablet (20 mg total) by mouth daily. 90 tablet 3   metoprolol succinate (TOPROL XL) 25 MG 24 hr tablet Take 1 tablet (25 mg total) by mouth daily. 90 tablet 3   Triamcinolone

## 2023-04-24 ENCOUNTER — Telehealth: Payer: Self-pay | Admitting: Cardiology

## 2023-04-24 MED ORDER — SIMVASTATIN 20 MG PO TABS
20.0000 mg | ORAL_TABLET | Freq: Every day | ORAL | 0 refills | Status: DC
Start: 2023-04-24 — End: 2023-06-26

## 2023-04-24 NOTE — Telephone Encounter (Signed)
Pt c/o medication issue:  1. Name of Medication: Atorvastatin  2. How are you currently taking this medication (dosage and times per day)?   3. Are you having a reaction (difficulty breathing--STAT)?   4. What is your medication issue? Patient said he stopped taking the Atorvastatin, his muscle were hurting so bad. He said he started back taking Simvastatin. He said he needs you to please call in the Simvastatin in for him please.

## 2023-04-24 NOTE — Telephone Encounter (Signed)
Good Morning,  Please advise. Patient states that atorvastatin was causing muscle pain and is now requesting simvastatin.  Please see telephone note for today. Thank you so much.

## 2023-04-25 NOTE — Telephone Encounter (Signed)
Medication sent   simvastatin (ZOCOR) 20 MG tablet 90 tablet 0 04/24/2023 07/23/2023   Sig - Route: Take 1 tablet (20 mg total) by mouth daily. - Oral   Sent to pharmacy as: simvastatin (ZOCOR) 20 MG tablet   E-Prescribing Status: Receipt confirmed by pharmacy (04/24/2023  5:17 PM EDT)    Pharmacy  [id number] - [place], [place] - [address]

## 2023-06-22 ENCOUNTER — Encounter
Admission: EM | Disposition: A | Discharge status: Home / Self Care | Payer: Self-pay | Source: Home / Self Care | Attending: Obstetrics and Gynecology

## 2023-06-22 ENCOUNTER — Inpatient Hospital Stay: Payer: Medicare HMO

## 2023-06-22 ENCOUNTER — Emergency Department: Payer: Medicare HMO

## 2023-06-22 ENCOUNTER — Inpatient Hospital Stay
Admission: EM | Admit: 2023-06-22 | Discharge: 2023-06-26 | DRG: 322 | Disposition: A | Discharge status: Home / Self Care | Payer: Medicare HMO | Attending: Obstetrics and Gynecology | Admitting: Obstetrics and Gynecology

## 2023-06-22 ENCOUNTER — Other Ambulatory Visit: Payer: Self-pay

## 2023-06-22 DIAGNOSIS — Z8546 Personal history of malignant neoplasm of prostate: Secondary | ICD-10-CM

## 2023-06-22 DIAGNOSIS — I214 Non-ST elevation (NSTEMI) myocardial infarction: Secondary | ICD-10-CM | POA: Diagnosis not present

## 2023-06-22 DIAGNOSIS — Z7901 Long term (current) use of anticoagulants: Secondary | ICD-10-CM

## 2023-06-22 DIAGNOSIS — I213 ST elevation (STEMI) myocardial infarction of unspecified site: Secondary | ICD-10-CM | POA: Diagnosis present

## 2023-06-22 DIAGNOSIS — I200 Unstable angina: Secondary | ICD-10-CM | POA: Diagnosis present

## 2023-06-22 DIAGNOSIS — I10 Essential (primary) hypertension: Secondary | ICD-10-CM | POA: Diagnosis not present

## 2023-06-22 DIAGNOSIS — N1831 Chronic kidney disease, stage 3a: Secondary | ICD-10-CM | POA: Diagnosis present

## 2023-06-22 DIAGNOSIS — Z8673 Personal history of transient ischemic attack (TIA), and cerebral infarction without residual deficits: Secondary | ICD-10-CM | POA: Diagnosis not present

## 2023-06-22 DIAGNOSIS — R7303 Prediabetes: Secondary | ICD-10-CM | POA: Diagnosis present

## 2023-06-22 DIAGNOSIS — N1832 Chronic kidney disease, stage 3b: Secondary | ICD-10-CM

## 2023-06-22 DIAGNOSIS — Z8249 Family history of ischemic heart disease and other diseases of the circulatory system: Secondary | ICD-10-CM

## 2023-06-22 DIAGNOSIS — E861 Hypovolemia: Secondary | ICD-10-CM | POA: Diagnosis present

## 2023-06-22 DIAGNOSIS — I4819 Other persistent atrial fibrillation: Secondary | ICD-10-CM | POA: Diagnosis not present

## 2023-06-22 DIAGNOSIS — M7989 Other specified soft tissue disorders: Secondary | ICD-10-CM | POA: Insufficient documentation

## 2023-06-22 DIAGNOSIS — Z7982 Long term (current) use of aspirin: Secondary | ICD-10-CM

## 2023-06-22 DIAGNOSIS — I493 Ventricular premature depolarization: Secondary | ICD-10-CM | POA: Diagnosis present

## 2023-06-22 DIAGNOSIS — Z79899 Other long term (current) drug therapy: Secondary | ICD-10-CM | POA: Diagnosis not present

## 2023-06-22 DIAGNOSIS — Z7902 Long term (current) use of antithrombotics/antiplatelets: Secondary | ICD-10-CM | POA: Diagnosis not present

## 2023-06-22 DIAGNOSIS — I4510 Unspecified right bundle-branch block: Secondary | ICD-10-CM | POA: Diagnosis present

## 2023-06-22 DIAGNOSIS — I249 Acute ischemic heart disease, unspecified: Principal | ICD-10-CM

## 2023-06-22 DIAGNOSIS — E785 Hyperlipidemia, unspecified: Secondary | ICD-10-CM | POA: Diagnosis present

## 2023-06-22 DIAGNOSIS — Z87891 Personal history of nicotine dependence: Secondary | ICD-10-CM | POA: Diagnosis not present

## 2023-06-22 DIAGNOSIS — I4821 Permanent atrial fibrillation: Secondary | ICD-10-CM | POA: Diagnosis present

## 2023-06-22 DIAGNOSIS — E1122 Type 2 diabetes mellitus with diabetic chronic kidney disease: Secondary | ICD-10-CM | POA: Diagnosis present

## 2023-06-22 DIAGNOSIS — I639 Cerebral infarction, unspecified: Secondary | ICD-10-CM | POA: Insufficient documentation

## 2023-06-22 DIAGNOSIS — E782 Mixed hyperlipidemia: Secondary | ICD-10-CM | POA: Diagnosis present

## 2023-06-22 DIAGNOSIS — I2510 Atherosclerotic heart disease of native coronary artery without angina pectoris: Secondary | ICD-10-CM | POA: Insufficient documentation

## 2023-06-22 DIAGNOSIS — E1169 Type 2 diabetes mellitus with other specified complication: Secondary | ICD-10-CM

## 2023-06-22 DIAGNOSIS — I129 Hypertensive chronic kidney disease with stage 1 through stage 4 chronic kidney disease, or unspecified chronic kidney disease: Secondary | ICD-10-CM | POA: Diagnosis present

## 2023-06-22 DIAGNOSIS — I4891 Unspecified atrial fibrillation: Secondary | ICD-10-CM | POA: Insufficient documentation

## 2023-06-22 DIAGNOSIS — I25110 Atherosclerotic heart disease of native coronary artery with unstable angina pectoris: Secondary | ICD-10-CM | POA: Diagnosis present

## 2023-06-22 DIAGNOSIS — I300 Acute nonspecific idiopathic pericarditis: Principal | ICD-10-CM | POA: Diagnosis present

## 2023-06-22 DIAGNOSIS — R079 Chest pain, unspecified: Secondary | ICD-10-CM

## 2023-06-22 DIAGNOSIS — I9589 Other hypotension: Secondary | ICD-10-CM | POA: Diagnosis present

## 2023-06-22 DIAGNOSIS — Z880 Allergy status to penicillin: Secondary | ICD-10-CM | POA: Diagnosis not present

## 2023-06-22 DIAGNOSIS — Z955 Presence of coronary angioplasty implant and graft: Secondary | ICD-10-CM

## 2023-06-22 HISTORY — PX: LEFT HEART CATH AND CORONARY ANGIOGRAPHY: CATH118249

## 2023-06-22 HISTORY — PX: CORONARY/GRAFT ACUTE MI REVASCULARIZATION: CATH118305

## 2023-06-22 LAB — CBC
HCT: 36.1 % — ABNORMAL LOW (ref 39.0–52.0)
Hemoglobin: 11.6 g/dL — ABNORMAL LOW (ref 13.0–17.0)
MCH: 30.8 pg (ref 26.0–34.0)
MCHC: 32.1 g/dL (ref 30.0–36.0)
MCV: 95.8 fL (ref 80.0–100.0)
Platelets: 192 10*3/uL (ref 150–400)
RBC: 3.77 MIL/uL — ABNORMAL LOW (ref 4.22–5.81)
RDW: 13.8 % (ref 11.5–15.5)
WBC: 8.8 10*3/uL (ref 4.0–10.5)
nRBC: 0 % (ref 0.0–0.2)

## 2023-06-22 LAB — PROTIME-INR
INR: 1.8 — ABNORMAL HIGH (ref 0.8–1.2)
Prothrombin Time: 21 s — ABNORMAL HIGH (ref 11.4–15.2)

## 2023-06-22 LAB — BASIC METABOLIC PANEL
Anion gap: 9 (ref 5–15)
BUN: 33 mg/dL — ABNORMAL HIGH (ref 8–23)
CO2: 22 mmol/L (ref 22–32)
Calcium: 8.3 mg/dL — ABNORMAL LOW (ref 8.9–10.3)
Chloride: 107 mmol/L (ref 98–111)
Creatinine, Ser: 1.56 mg/dL — ABNORMAL HIGH (ref 0.61–1.24)
GFR, Estimated: 43 mL/min — ABNORMAL LOW (ref >60)
Glucose, Bld: 120 mg/dL — ABNORMAL HIGH (ref 70–99)
Potassium: 4.2 mmol/L (ref 3.5–5.1)
Sodium: 138 mmol/L (ref 135–145)

## 2023-06-22 LAB — TROPONIN I (HIGH SENSITIVITY)
Troponin I (High Sensitivity): 11 ng/L (ref <18)
Troponin I (High Sensitivity): 13 ng/L (ref <18)
Troponin I (High Sensitivity): 15 ng/L (ref <18)

## 2023-06-22 LAB — LIPID PANEL
Cholesterol: 152 mg/dL (ref 0–200)
HDL: 63 mg/dL (ref >40)
LDL Cholesterol: 78 mg/dL (ref 0–99)
Total CHOL/HDL Ratio: 2.4 {ratio}
Triglycerides: 56 mg/dL (ref <150)
VLDL: 11 mg/dL (ref 0–40)

## 2023-06-22 LAB — MRSA NEXT GEN BY PCR, NASAL: MRSA by PCR Next Gen: NOT DETECTED

## 2023-06-22 LAB — GLUCOSE, CAPILLARY
Glucose-Capillary: 102 mg/dL — ABNORMAL HIGH (ref 70–99)
Glucose-Capillary: 100 mg/dL — ABNORMAL HIGH (ref 70–99)

## 2023-06-22 LAB — APTT: aPTT: 50 s — ABNORMAL HIGH (ref 24–36)

## 2023-06-22 LAB — HEPARIN LEVEL (UNFRACTIONATED): Heparin Unfractionated: >1.1 [IU]/mL — ABNORMAL HIGH (ref 0.30–0.70)

## 2023-06-22 SURGERY — CORONARY/GRAFT ACUTE MI REVASCULARIZATION

## 2023-06-22 MED ORDER — VERAPAMIL HCL 2.5 MG/ML IV SOLN
INTRAVENOUS | Status: AC
Start: 2023-06-22 — End: ?
  Filled 2023-06-22: qty 2

## 2023-06-22 MED ORDER — ASPIRIN 81 MG PO TBEC
81.0000 mg | DELAYED_RELEASE_TABLET | Freq: Every day | ORAL | Status: DC
  Administered 2023-06-22 – 2023-06-24 (×3): 81 mg via ORAL
  Filled 2023-06-22 (×3): qty 1

## 2023-06-22 MED ORDER — HEPARIN (PORCINE) 25000 UT/250ML-% IV SOLN
1100.0000 [IU]/h | INTRAVENOUS | Status: DC
  Administered 2023-06-22: 1100 [IU]/h via INTRAVENOUS
  Filled 2023-06-22: qty 250

## 2023-06-22 MED ORDER — ONDANSETRON HCL 4 MG/2ML IJ SOLN: 4.0000 mg | Freq: Four times a day (QID) | INTRAMUSCULAR | Status: DC | PRN

## 2023-06-22 MED ORDER — SODIUM CHLORIDE 0.9% FLUSH: 3.0000 mL | INTRAVENOUS | Status: DC | PRN

## 2023-06-22 MED ORDER — SODIUM CHLORIDE 0.9 % WEIGHT BASED INFUSION
1.0000 mL/kg/h | INTRAVENOUS | Status: AC
  Administered 2023-06-22 (×2): 1 mL/kg/h via INTRAVENOUS

## 2023-06-22 MED ORDER — ACETAMINOPHEN 500 MG PO TABS: 500.0000 mg | ORAL_TABLET | Freq: Four times a day (QID) | ORAL | Status: DC | PRN

## 2023-06-22 MED ORDER — ORAL CARE MOUTH RINSE: 15.0000 mL | OROMUCOSAL | Status: DC | PRN

## 2023-06-22 MED ORDER — SODIUM CHLORIDE 0.9% FLUSH
3.0000 mL | INTRAVENOUS | Status: DC | PRN
Start: 2023-06-22 — End: 2023-06-23

## 2023-06-22 MED ORDER — HEPARIN (PORCINE) 25000 UT/250ML-% IV SOLN
1300.0000 [IU]/h | INTRAVENOUS | Status: DC
  Administered 2023-06-22 – 2023-06-24 (×3): 1100 [IU]/h via INTRAVENOUS
  Filled 2023-06-22 (×3): qty 250

## 2023-06-22 MED ORDER — ATORVASTATIN CALCIUM 20 MG PO TABS
40.0000 mg | ORAL_TABLET | Freq: Every day | ORAL | Status: DC
  Administered 2023-06-23: 40 mg via ORAL
  Filled 2023-06-22 (×2): qty 2

## 2023-06-22 MED ORDER — SODIUM CHLORIDE 0.9% FLUSH
3.0000 mL | Freq: Two times a day (BID) | INTRAVENOUS | Status: DC
  Administered 2023-06-22: 10 mL via INTRAVENOUS
  Administered 2023-06-23 (×2): 3 mL via INTRAVENOUS
  Administered 2023-06-24 – 2023-06-26 (×4): 10 mL via INTRAVENOUS

## 2023-06-22 MED ORDER — SODIUM CHLORIDE 0.9% FLUSH: 3.0000 mL | Freq: Two times a day (BID) | INTRAVENOUS | Status: DC

## 2023-06-22 MED ORDER — HYDRALAZINE HCL 20 MG/ML IJ SOLN
10.0000 mg | INTRAMUSCULAR | Status: AC | PRN
Start: 2023-06-22 — End: 2023-06-22

## 2023-06-22 MED ORDER — LABETALOL HCL 5 MG/ML IV SOLN
10.0000 mg | INTRAVENOUS | Status: AC | PRN
Start: 2023-06-22 — End: 2023-06-22

## 2023-06-22 MED ORDER — INSULIN ASPART 100 UNIT/ML IJ SOLN: 0.0000 [IU] | Freq: Three times a day (TID) | INTRAMUSCULAR | Status: DC

## 2023-06-22 MED ORDER — MIDAZOLAM HCL 2 MG/2ML IJ SOLN
INTRAMUSCULAR | Status: AC
  Filled 2023-06-22: qty 2

## 2023-06-22 MED ORDER — CHLORHEXIDINE GLUCONATE CLOTH 2 % EX PADS
6.0000 | MEDICATED_PAD | Freq: Every day | CUTANEOUS | Status: DC
Start: 2023-06-23 — End: 2023-06-25
  Administered 2023-06-23 – 2023-06-25 (×4): 6 via TOPICAL

## 2023-06-22 MED ORDER — HEPARIN SODIUM (PORCINE) 1000 UNIT/ML IJ SOLN
INTRAMUSCULAR | Status: DC | PRN
  Administered 2023-06-22: 4000 [IU] via INTRAVENOUS

## 2023-06-22 MED ORDER — LIDOCAINE HCL (PF) 1 % IJ SOLN
INTRAMUSCULAR | Status: DC | PRN
  Administered 2023-06-22: 2 mL

## 2023-06-22 MED ORDER — HEPARIN (PORCINE) IN NACL 1000-0.9 UT/500ML-% IV SOLN
INTRAVENOUS | Status: AC
  Filled 2023-06-22: qty 1000

## 2023-06-22 MED ORDER — VERAPAMIL HCL 2.5 MG/ML IV SOLN
INTRAVENOUS | Status: DC | PRN
  Administered 2023-06-22: 2.5 mg via INTRA_ARTERIAL

## 2023-06-22 MED ORDER — HEPARIN SODIUM (PORCINE) 5000 UNIT/ML IJ SOLN
4000.0000 [IU] | Freq: Once | INTRAMUSCULAR | Status: AC
  Administered 2023-06-22: 4000 [IU] via INTRAVENOUS
  Filled 2023-06-22: qty 1

## 2023-06-22 MED ORDER — HEPARIN (PORCINE) IN NACL 2000-0.9 UNIT/L-% IV SOLN
INTRAVENOUS | Status: DC | PRN
  Administered 2023-06-22: 1000 mL

## 2023-06-22 MED ORDER — HEPARIN SODIUM (PORCINE) 1000 UNIT/ML IJ SOLN
INTRAMUSCULAR | Status: AC
  Filled 2023-06-22: qty 10

## 2023-06-22 MED ORDER — FENTANYL CITRATE (PF) 100 MCG/2ML IJ SOLN
INTRAMUSCULAR | Status: AC
  Filled 2023-06-22: qty 2

## 2023-06-22 MED ORDER — METOPROLOL SUCCINATE ER 50 MG PO TB24
25.0000 mg | ORAL_TABLET | Freq: Every day | ORAL | Status: DC
  Filled 2023-06-22: qty 1

## 2023-06-22 MED ORDER — OXYCODONE-ACETAMINOPHEN 5-325 MG PO TABS
1.0000 | ORAL_TABLET | ORAL | Status: DC | PRN
  Administered 2023-06-22: 1 via ORAL
  Filled 2023-06-22: qty 1

## 2023-06-22 MED ORDER — SODIUM CHLORIDE 0.9 % IV SOLN: 250.0000 mL | INTRAVENOUS | Status: AC | PRN

## 2023-06-22 MED ORDER — MIDAZOLAM HCL 2 MG/2ML IJ SOLN
INTRAMUSCULAR | Status: DC | PRN
  Administered 2023-06-22: 1 mg via INTRAVENOUS

## 2023-06-22 MED ORDER — IOHEXOL 300 MG/ML  SOLN
INTRAMUSCULAR | Status: DC | PRN
  Administered 2023-06-22: 36 mL

## 2023-06-22 MED ORDER — FENTANYL CITRATE (PF) 100 MCG/2ML IJ SOLN
INTRAMUSCULAR | Status: DC | PRN
  Administered 2023-06-22: 25 ug via INTRAVENOUS

## 2023-06-22 MED ORDER — NITROGLYCERIN 0.4 MG SL SUBL: 0.4000 mg | SUBLINGUAL_TABLET | SUBLINGUAL | Status: DC | PRN

## 2023-06-22 MED ORDER — SODIUM CHLORIDE 0.9 % IV BOLUS
500.0000 mL | Freq: Once | INTRAVENOUS | Status: AC
  Administered 2023-06-22: 500 mL via INTRAVENOUS

## 2023-06-22 MED ORDER — ASPIRIN 81 MG PO CHEW: 324.0000 mg | CHEWABLE_TABLET | Freq: Once | ORAL | Status: DC

## 2023-06-22 MED ORDER — LIDOCAINE HCL 1 % IJ SOLN
INTRAMUSCULAR | Status: AC
  Filled 2023-06-22: qty 20

## 2023-06-22 SURGICAL SUPPLY — 12 items
CATH 5FR JL3.5 JR4 ANG PIG MP (CATHETERS) IMPLANT
DEVICE RAD TR BAND REGULAR (VASCULAR PRODUCTS) IMPLANT
DRAPE BRACHIAL (DRAPES) IMPLANT
GLIDESHEATH SLEND SS 6F .021 (SHEATH) IMPLANT
GUIDEWIRE INQWIRE 1.5J.035X260 (WIRE) IMPLANT
INQWIRE 1.5J .035X260CM (WIRE) ×1
KIT ENCORE 26 ADVANTAGE (KITS) IMPLANT
KIT SYRINGE INJ CVI SPIKEX1 (MISCELLANEOUS) IMPLANT
PROTECTION STATION PRESSURIZED (MISCELLANEOUS) ×1
SET ATX-X65L (MISCELLANEOUS) IMPLANT
STATION PROTECTION PRESSURIZED (MISCELLANEOUS) IMPLANT
TUBING CIL FLEX 10 FLL-RA (TUBING) IMPLANT

## 2023-06-22 NOTE — Assessment & Plan Note (Signed)
Cont statin

## 2023-06-22 NOTE — Progress Notes (Signed)
HR 40s-50s  Asymptomatic  EKG stable Hold BB  Monitor

## 2023-06-22 NOTE — Plan of Care (Signed)
  Problem: Education: Goal: Knowledge of General Education information will improve Description: Including pain rating scale, medication(s)/side effects and non-pharmacologic comfort measures Outcome: Progressing   Problem: Health Behavior/Discharge Planning: Goal: Ability to manage health-related needs will improve Outcome: Progressing   Problem: Clinical Measurements: Goal: Will remain free from infection Outcome: Progressing Goal: Cardiovascular complication will be avoided Outcome: Progressing   Problem: Nutrition: Goal: Adequate nutrition will be maintained Outcome: Progressing   Problem: Coping: Goal: Level of anxiety will decrease Outcome: Progressing   Problem: Elimination: Goal: Will not experience complications related to bowel motility Outcome: Progressing Goal: Will not experience complications related to urinary retention Outcome: Progressing   Problem: Pain Management: Goal: General experience of comfort will improve Outcome: Progressing   Problem: Safety: Goal: Ability to remain free from injury will improve Outcome: Progressing

## 2023-06-22 NOTE — Assessment & Plan Note (Signed)
Anticogulation per cardiology recommendations  Cont BB

## 2023-06-22 NOTE — H&P (Signed)
History and Physical    Patient: Jonathan [name] ZOX:[id number] DOB: [birth year] DOA: 06/22/2023 DOS: the patient was seen and examined on 06/22/2023 PCP: Marina Goodell, MD  Patient coming from:  Cath lab-ICU  Chief Complaint:  Chief Complaint  Patient presents with   Chest Pain   HPI: Jonathan [name] is a 89 y.o. male with medical history significant of CVA, atrial fibrillation, hypertension, hyperlipidemia, stage III CKD, history of prostate cancer, type 2 diabetes presenting with NSTEMI and chest pain.  Patient reports going to the bathroom earlier today with moderate to severe central chest pain.  Patient attempted to go lay back down but symptoms persisted.  Pain central in nature with some radiation of the neck and down the left arm.  Pain worse with deep breathing as well as lying down.  Minimal nausea or diaphoresis.  No abdominal pain or diarrhea.  Noted admission September this year for new onset CVA as well as atrial fibrillation.  Has been compliant with home regimen including Eliquis and beta-blocker.  Noted chronic lower extremity swelling.  No orthopnea or PND. Presented to ER afebrile, SBP 90s-100s, satting well on RA. WBC 8.8, Hgb 11.6, 192, Cr 1.56, trop negative. ? Critical STEMI on EKG. Code STEMI initially called w/ pt being sent to the cath lab.  Review of Systems: As mentioned in the history of present illness. All other systems reviewed and are negative. Past Medical History:  Diagnosis Date   Cancer [facility])    prostate   Hypercholesteremia    Hypertension    Pre-diabetes    Stroke [facility])    Past Surgical History:  Procedure Laterality Date   PROSTATE SURGERY     SKIN BIOPSY     basel cell   Social History:  reports that he quit smoking about 31 years ago. His smoking use included cigars. He has never used smokeless tobacco. He reports current alcohol use. He reports that he does not use drugs.  Allergies  Allergen Reactions   Penicillin V Potassium Other  (See Comments)   Penicillins Rash    Family History  Problem Relation Age of Onset   Heart disease Maternal Uncle    Heart disease Maternal Grandfather     Prior to Admission medications   Medication Sig Start Date End Date Taking? Authorizing Provider  acetaminophen (TYLENOL) 500 MG tablet Take 1 tablet (500 mg total) by mouth every 6 (six) hours as needed. 12/09/15   Plonk, Chrissie Noa, MD  apixaban (ELIQUIS) 5 MG TABS tablet Take 1 tablet (5 mg total) by mouth 2 (two) times daily. 03/07/23   Debbe Odea, MD  Cholecalciferol (VITAMIN D) 2000 units CAPS Take 1 capsule (2,000 Units total) by mouth daily. 12/09/15   Plonk, Chrissie Noa, MD  Cyanocobalamin (B-12) 500 MCG TABS Take 1 tablet by mouth daily. 07/02/17   Plonk, Chrissie Noa, MD  lisinopril (ZESTRIL) 20 MG tablet Take 1 tablet (20 mg total) by mouth daily. 03/07/23   Debbe Odea, MD  metoprolol succinate (TOPROL XL) 25 MG 24 hr tablet Take 1 tablet (25 mg total) by mouth daily. 03/07/23   Debbe Odea, MD  simvastatin (ZOCOR) 20 MG tablet Take 1 tablet (20 mg total) by mouth daily. 04/24/23 07/23/23  Debbe Odea, MD  Triamcinolone Acetonide (NASACORT ALLERGY 24HR NA) Place into the nose.    [provider]    Physical Exam: Vitals:   06/22/23 0850 06/22/23 0852 06/22/23 1129 06/22/23 1200  BP: 113/67  104/70 (!) 90/56  Pulse: 67  61  60  Resp: 17  12 12   Temp: 98.4 F (36.9 C)  98.1 F (36.7 C)   TempSrc: Oral  Oral   SpO2:   96% 97%  Weight:  96.2 kg 98.7 kg   Height:  5\' 9"  (1.753 m)     Physical Exam Constitutional:      Appearance: He is normal weight.  HENT:     Head: Normocephalic and atraumatic.     Nose: Nose normal.  Eyes:     Pupils: Pupils are equal, round, and reactive to light.  Cardiovascular:     Rate and Rhythm: Normal rate and regular rhythm.  Pulmonary:     Effort: Pulmonary effort is normal.  Abdominal:     General: Abdomen is flat. Bowel sounds are normal.  Musculoskeletal:         General: Normal range of motion.  Skin:    General: Skin is warm.  Neurological:     General: No focal deficit present.  Psychiatric:        Mood and Affect: Mood normal.     Data Reviewed:  There are no new results to review at this time.  CARDIAC CATHETERIZATION 1.  Two-vessel coronary artery disease with moderately severe stenosis of  the proximal right coronary artery and severe stenosis of the proximal LAD 2.  Patency of the left main and left circumflex with no obstructive  disease 3.  Normal LVEDP  Recommendations: Recommend IV fluid hydration, repeat renal function labs,  hold Eliquis and use IV heparin, check 2D echocardiogram to rule out  pericardial effusion, check CRP and sed rate to evaluate for pericarditis,  consider staged PCI on Monday if renal function stable and no pericardial  effusion seen on echo.  While I think his coronary lesions are  hemodynamically significant, it is not clear that he has a plaque rupture  or culprit lesion.  Plan discussed with hospitalist team and patient's  family over the telephone. DG Chest 2 View CLINICAL DATA:  Chest pain.  EXAM: CHEST - 2 VIEW  COMPARISON:  None Available.  FINDINGS: The heart size and mediastinal contours are within normal limits. Minimal bibasilar subsegmental atelectasis or scarring is noted. The visualized skeletal structures are unremarkable.  IMPRESSION: Minimal bibasilar subsegmental atelectasis or scarring.  Electronically Signed   By: Lupita Raider M.D.   On: 06/22/2023 09:13  Lab Results  Component Value Date   WBC 8.8 06/22/2023   HGB 11.6 (L) 06/22/2023   HCT 36.1 (L) 06/22/2023   MCV 95.8 06/22/2023   PLT 192 06/22/2023   Last metabolic panel Lab Results  Component Value Date   GLUCOSE 120 (H) 06/22/2023   NA 138 06/22/2023   K 4.2 06/22/2023   CL 107 06/22/2023   CO2 22 06/22/2023   BUN 33 (H) 06/22/2023   CREATININE 1.56 (H) 06/22/2023   GFRNONAA 43 (L)  06/22/2023   CALCIUM 8.3 (L) 06/22/2023   PROT 7.3 03/01/2023   ALBUMIN 4.2 03/01/2023   LABGLOB 2.4 07/01/2017   AGRATIO 2.0 07/01/2017   BILITOT 1.4 (H) 03/01/2023   ALKPHOS 99 03/01/2023   AST 16 03/01/2023   ALT 14 03/01/2023   ANIONGAP 9 06/22/2023    Assessment and Plan: NSTEMI (non-ST elevated myocardial infarction) [facility]) Admitted earlier today with complaint of chest pain-code STEMI initiated Borderline troponin Upon review by cardiology with Dr. Excell Seltzer Status post cardiac catheterization with noted two-vessel disease Differential diagnosis remains fairly broad for chest pain and discussion with Dr.  Cooper including pericarditis and pericardial effusion Pending 2D echo Will continue with pain control and anticoagulation per cardiology recommendations If echo within normal limits and otherwise normal workup over the course of the weekend, tentative plan for repeat catheterization and stenting on 12/30 per Dr. Excell Seltzer Monitor  Atrial fibrillation [facility]) Anticogulation per cardiology recommendations  Cont BB   CVA (cerebral vascular accident) [facility]) Recent evaluation 02/2023 for CVA w/ prior MRI demonstrating 9 mm acute ischemic infarct involving the superior right insula  Noted atrial fibrillation  Eliquis on hold in setting of NSTEMI w/ heparin gtt Cont statin, BB   Chronic kidney disease, stage 3a (HCC) Creatinine 1.56 with GFR in 40s Near baseline Monitor renal function with recent dye load  Hyperlipidemia Cont statin    Benign hypertension Holding ACEi given baseline stage 3 CKD and large dye load  Otherwise titrate BP regimen        Advance Care Planning:   Code Status: Full Code   Consults: Cardiology   Family Communication: No family at the bedside   Severity of Illness: The appropriate patient status for this patient is INPATIENT. Inpatient status is judged to be reasonable and necessary in order to provide the required intensity of service to  ensure the patient's safety. The patient's presenting symptoms, physical exam findings, and initial radiographic and laboratory data in the context of their chronic comorbidities is felt to place them at high risk for further clinical deterioration. Furthermore, it is not anticipated that the patient will be medically stable for discharge from the hospital within 2 midnights of admission.   * I certify that at the point of admission it is my clinical judgment that the patient will require inpatient hospital care spanning beyond 2 midnights from the point of admission due to high intensity of service, high risk for further deterioration and high frequency of surveillance required.*  Author: Floydene Flock, MD 06/22/2023 12:24 PM  For on call review [email or web address].

## 2023-06-22 NOTE — ED Notes (Signed)
Cardiology at bedside.

## 2023-06-22 NOTE — Assessment & Plan Note (Addendum)
Recent evaluation 02/2023 for CVA w/ prior MRI demonstrating 9 mm acute ischemic infarct involving the superior right insula  Noted atrial fibrillation  Eliquis on hold in setting of NSTEMI w/ heparin gtt Cont statin, BB

## 2023-06-22 NOTE — Consult Note (Signed)
Cardiology Consultation   Patient ID: Jonathan [name] MRN: [id number]; DOB: 13-Jun-1934  Admit date: 06/22/2023 Date of Consult: 06/22/2023  PCP:  Marina Goodell, MD   [facility] Providers Cardiologist:  Debbe Odea, MD        Patient Profile:   Jonathan [name] is a 89 y.o. male with a hx of permanent atrial fibrillation who is being seen 06/22/2023 for the evaluation of chest pain/STEMI at the request of Dr. Arnoldo Morale.  History of Present Illness:   Jonathan [name] has no past history of coronary artery disease.  He presented with acute onset of substernal chest discomfort at 4 AM today.  This occurred when he got up to use the bathroom.  States that the pain is left-sided and worse with inspiration and lying backwards.  He got up again and his chest pain recurred.  He called EMS who brought him to [facility] for further evaluation.  After review of his EKGs, I discussed his case with the EDP and a code STEMI was paged.  He had borderline EKG changes but with his compelling history we elected to proceed emergently with presumed STEMI care.  At the time of my arrival, the patient is chest pain-free and well-appearing.  He states that he still has pain when he takes a deep breath but otherwise no chest discomfort at rest.  He has had no prodrome of symptoms.  He denies any exertional symptoms before today's symptom onset.  He denies fever, chills, cough, or diaphoresis.  He has had some shortness of breath that occurred this morning associated with his symptoms.  He has had leg edema since his hydrochlorothiazide was stopped.  He is on apixaban and took his last dose this morning.  He denies any heart palpitations, lightheadedness, or syncope.  Cardiovascular problems include hypertension, mixed hyperlipidemia, and persistent atrial fibrillation.  An echocardiogram performed about 3 months ago showed an LVEF of 60 to 65% with no significant valvular disease.   Past Medical History:   Diagnosis Date   Cancer [facility])    prostate   Hypercholesteremia    Hypertension    Pre-diabetes    Stroke [facility])     Past Surgical History:  Procedure Laterality Date   PROSTATE SURGERY     SKIN BIOPSY     basel cell     Home Medications:  Prior to Admission medications   Medication Sig Start Date End Date Taking? Authorizing Provider  acetaminophen (TYLENOL) 500 MG tablet Take 1 tablet (500 mg total) by mouth every 6 (six) hours as needed. 12/09/15   Plonk, Chrissie Noa, MD  apixaban (ELIQUIS) 5 MG TABS tablet Take 1 tablet (5 mg total) by mouth 2 (two) times daily. 03/07/23   Debbe Odea, MD  Cholecalciferol (VITAMIN D) 2000 units CAPS Take 1 capsule (2,000 Units total) by mouth daily. 12/09/15   Plonk, Chrissie Noa, MD  Cyanocobalamin (B-12) 500 MCG TABS Take 1 tablet by mouth daily. 07/02/17   Plonk, Chrissie Noa, MD  lisinopril (ZESTRIL) 20 MG tablet Take 1 tablet (20 mg total) by mouth daily. 03/07/23   Debbe Odea, MD  metoprolol succinate (TOPROL XL) 25 MG 24 hr tablet Take 1 tablet (25 mg total) by mouth daily. 03/07/23   Debbe Odea, MD  simvastatin (ZOCOR) 20 MG tablet Take 1 tablet (20 mg total) by mouth daily. 04/24/23 07/23/23  Debbe Odea, MD  Triamcinolone Acetonide (NASACORT ALLERGY 24HR NA) Place into the nose.    [provider]    Inpatient Medications:  Scheduled Meds:  aspirin  324 mg Oral Once   sodium chloride flush  3-10 mL Intravenous Q12H   Continuous Infusions:  heparin 1,100 Units/hr (06/22/23 1018)   PRN Meds: sodium chloride flush  Allergies:    Allergies  Allergen Reactions   Penicillin V Potassium Other (See Comments)   Penicillins Rash    Social History:   Social History   Socioeconomic History   Marital status: Married    Spouse name: Not on file   Number of children: Not on file   Years of education: Not on file   Highest education level: Not on file  Occupational History   Not on file  Tobacco Use   Smoking  status: Former    Types: Cigars    Quit date: 5    Years since quitting: 31.0   Smokeless tobacco: Never  Vaping Use   Vaping status: Never Used  Substance and Sexual Activity   Alcohol use: Yes    Comment: occasional/2 beers a month   Drug use: Never   Sexual activity: Yes  Other Topics Concern   Not on file  Social History Narrative   Not on file   Social Drivers of Health   Financial Resource Strain: Low Risk  (01/25/2023)   Received from [facility]   Overall Financial Resource Strain (CARDIA)    Difficulty of Paying Living Expenses: Not hard at all  Food Insecurity: No Food Insecurity (03/02/2023)   Hunger Vital Sign    Worried About Running Out of Food in the Last Year: Never true    Ran Out of Food in the Last Year: Never true  Transportation Needs: No Transportation Needs (03/02/2023)   PRAPARE - Administrator, Civil Service (Medical): No    Lack of Transportation (Non-Medical): No  Physical Activity: Not on file  Stress: Not on file  Social Connections: Not on file  Intimate Partner Violence: Not At Risk (03/02/2023)   Humiliation, Afraid, Rape, and Kick questionnaire    Fear of Current or Ex-Partner: No    Emotionally Abused: No    Physically Abused: No    Sexually Abused: No    Family History:   Family History  Problem Relation Age of Onset   Heart disease Maternal Uncle    Heart disease Maternal Grandfather      ROS:  Please see the history of present illness.  All other ROS reviewed and negative.     Physical Exam/Data:   Vitals:   06/22/23 0850 06/22/23 0852 06/22/23 1129  BP: 113/67    Pulse: 67    Resp: 17  12  Temp: 98.4 F (36.9 C)    TempSrc: Oral    SpO2:   96%  Weight:  96.2 kg   Height:  5\' 9"  (1.753 m)    No intake or output data in the 24 hours ending 06/22/23 1130    06/22/2023    8:52 AM 03/22/2023   10:11 AM 03/07/2023    9:13 AM  Last 3 Weights  Weight (lbs) 212 lb 221 lb 215 lb 9.6 oz  Weight  (kg) 96.163 kg 100.245 kg 97.796 kg     Body mass index is 31.31 kg/m.  General:  Well nourished, well developed, pleasant elderly male in no acute distress HEENT: normal Neck: no JVD Vascular: No carotid bruits; Distal pulses 2+ bilaterally Cardiac:  normal S1, S2; irregularly irregular; no murmur  Lungs:  clear to auscultation bilaterally, no wheezing, rhonchi  or rales  Abd: soft, nontender, no hepatomegaly  Ext: 2+ bilateral pretibial and ankle edema Musculoskeletal:  No deformities, BUE and BLE strength normal and equal Skin: warm and dry  Neuro:  CNs 2-12 intact, no focal abnormalities noted Psych:  Normal affect   EKG:  The EKG was personally reviewed and demonstrates: Atrial fibrillation, rate controlled, borderline ST elevation inferiorly without reciprocal changes  Relevant CV Studies: 2D echocardiogram 03/02/2023:  1. Left ventricular ejection fraction, by estimation, is 60 to 65%. The  left ventricle has normal function. The left ventricle has no regional  wall motion abnormalities. There is mild left ventricular hypertrophy.  Left ventricular diastolic parameters  are indeterminate.   2. Right ventricular systolic function is normal. The right ventricular  size is normal. There is mildly elevated pulmonary artery systolic  pressure. The estimated right ventricular systolic pressure is 39.1 mmHg.   3. Left atrial size was moderately dilated.   4. The mitral valve is normal in structure. Moderate mitral valve  regurgitation. No evidence of mitral stenosis.   5. Tricuspid valve regurgitation is mild to moderate.   6. The aortic valve is tricuspid. There is mild calcification of the  aortic valve. Aortic valve regurgitation is mild. Aortic valve sclerosis  is present, with no evidence of aortic valve stenosis.   7. The inferior vena cava is normal in size with greater than 50%  respiratory variability, suggesting right atrial pressure of 3 mmHg.   Laboratory Data:  High  Sensitivity Troponin:   Recent Labs  Lab 06/22/23 0857  TROPONINIHS 11     Chemistry Recent Labs  Lab 06/22/23 0857  NA 138  K 4.2  CL 107  CO2 22  GLUCOSE 120*  BUN 33*  CREATININE 1.56*  CALCIUM 8.3*  GFRNONAA 43*  ANIONGAP 9    No results for input(s): "PROT", "ALBUMIN", "AST", "ALT", "ALKPHOS", "BILITOT" in the last 168 hours. Lipids  Recent Labs  Lab 06/22/23 0940  CHOL 152  TRIG 56  HDL 63  LDLCALC 78  CHOLHDL 2.4    Hematology Recent Labs  Lab 06/22/23 0857  WBC 8.8  RBC 3.77*  HGB 11.6*  HCT 36.1*  MCV 95.8  MCH 30.8  MCHC 32.1  RDW 13.8  PLT 192   Thyroid No results for input(s): "TSH", "FREET4" in the last 168 hours.  BNPNo results for input(s): "BNP", "PROBNP" in the last 168 hours.  DDimer No results for input(s): "DDIMER" in the last 168 hours.   Radiology/Studies:  DG Chest 2 View Result Date: 06/22/2023 CLINICAL DATA:  Chest pain. EXAM: CHEST - 2 VIEW COMPARISON:  None Available. FINDINGS: The heart size and mediastinal contours are within normal limits. Minimal bibasilar subsegmental atelectasis or scarring is noted. The visualized skeletal structures are unremarkable. IMPRESSION: Minimal bibasilar subsegmental atelectasis or scarring. Electronically Signed   By: Lupita Raider M.D.   On: 06/22/2023 09:13     Assessment and Plan:   Chest pain possible ACS: The patient has borderline EKG changes and a fairly compelling history this morning of new onset angina at rest and with minimal activity.,  We will proceed emergently with cardiac catheterization and possible PCI to evaluate for a culprit lesion or occlusion.  He has received IV heparin and aspirin per protocol.  Further plans/disposition pending his cardiac catheterization findings.  Will also check a 2D echocardiogram as he has some chest pain characteristics of pericarditis. Persistent atrial fibrillation: Remains rate controlled, on apixaban last dose this morning.  Will plan radial  approach to reduce bleeding risk.  Resume apixaban post procedure when safe.  Continue low-dose metoprolol succinate. Mixed hyperlipidemia: On low-dose simvastatin, likely changed to high intensity statin drug pending his cardiac catheterization findings Hypertension: Blood pressure currently controlled.  Patient treated with an ACE inhibitor and beta-blocker.  Will hold his ACE inhibitor around the time of contrast administration. Chronic kidney disease stage IIIb: Minimize contrast is much as possible, fluid hydration pending evaluation of hemodynamics and LVEDP, follow creatinine.   Risk Assessment/Risk Scores:     TIMI Risk Score for Unstable Angina or Non-ST Elevation MI:   The patient's TIMI risk score is 4, which indicates a 20% risk of all cause mortality, new or recurrent myocardial infarction or need for urgent revascularization in the next 14 days.    CHA2DS2-VASc Score = 6   This indicates a 9.7% annual risk of stroke. The patient's score is based upon: CHF History: 0 HTN History: 1 Diabetes History: 0 Stroke History: 2 Vascular Disease History: 1 Age Score: 2 Gender Score: 0      For questions or updates, please contact [facility] Please consult [email or web address] for contact info under    Signed, Tonny Bollman, MD  06/22/2023 11:30 AM

## 2023-06-22 NOTE — Assessment & Plan Note (Signed)
Creatinine 1.56 with GFR in 40s Near baseline Monitor renal function with recent dye load

## 2023-06-22 NOTE — ED Provider Notes (Signed)
[facility] Provider Note    Event Date/Time   First MD Initiated Contact with Patient 06/22/23 0935     (approximate)   History   Chest Pain   HPI  Jonathan [name] is a 89 y.o. male past medical history significant for prior CVA, atrial fibrillation on Eliquis, who presents to the emergency department with chest pain.  States he for woke up from sleep at 4:00 this morning, walking to the bathroom developed significant chest pain radiating across his chest.  Laid back down and went to sleep and had improvement of his chest pain.  Again woke up from sleep and had worsening chest pain when walking to the bathroom.  States that his chest pain is worse with deep inspiration and when laying back.  No recent viral illness.  Denies any nausea, vomiting or diaphoresis.  Denies any significant shortness of breath.  Took Eliquis this morning.     Physical Exam   Triage Vital Signs: ED Triage Vitals  Encounter Vitals Group     BP 06/22/23 0850 113/67     Systolic BP Percentile --      Diastolic BP Percentile --      Pulse Rate 06/22/23 0850 67     Resp 06/22/23 0850 17     Temp 06/22/23 0850 98.4 F (36.9 C)     Temp Source 06/22/23 0850 Oral     SpO2 --      Weight 06/22/23 0852 212 lb (96.2 kg)     Height 06/22/23 0852 5\' 9"  (1.753 m)     Head Circumference --      Peak Flow --      Pain Score 06/22/23 0851 4     Pain Loc --      Pain Education --      Exclude from Growth Chart --     Most recent vital signs: Vitals:   06/22/23 0850  BP: 113/67  Pulse: 67  Resp: 17  Temp: 98.4 F (36.9 C)    Physical Exam Constitutional:      Appearance: He is well-developed.  HENT:     Head: Atraumatic.  Eyes:     Conjunctiva/sclera: Conjunctivae normal.  Cardiovascular:     Rate and Rhythm: Regular rhythm.     Heart sounds: Normal heart sounds.  Pulmonary:     Effort: No respiratory distress.  Musculoskeletal:     Cervical back: Normal range of  motion.     Right lower leg: Edema present.     Left lower leg: Edema present.  Skin:    General: Skin is warm.  Neurological:     Mental Status: He is alert. Mental status is at baseline.     IMPRESSION / MDM / ASSESSMENT AND PLAN / ED COURSE  I reviewed the triage vital signs and the nursing notes.  Differential gnosis including ACS, pericarditis, PE  EKG  I, Corena Herter, the attending physician, personally viewed and interpreted this ECG.  ST elevation in inferior leads with no reciprocal change.  Mild change when compared to prior EKG.  Does have some artifact.  Repeat EKG was done in 20 minutes and continues to have some mild ST elevation in the inferior leads.  Clinical picture concerning for STEMI, activated code STEMI.  RADIOLOGY I independently reviewed imaging, my interpretation of imaging: Chest x-ray with no widened mediastinum.  LABS (all labs ordered are listed, but only abnormal results are displayed) Labs interpreted as -  Labs Reviewed  BASIC METABOLIC PANEL - Abnormal; Notable for the following components:      Result Value   Glucose, Bld 120 (*)    BUN 33 (*)    Creatinine, Ser 1.56 (*)    Calcium 8.3 (*)    GFR, Estimated 43 (*)    All other components within normal limits  CBC - Abnormal; Notable for the following components:   RBC 3.77 (*)    Hemoglobin 11.6 (*)    HCT 36.1 (*)    All other components within normal limits  PROTIME-INR - Abnormal; Notable for the following components:   Prothrombin Time 21.0 (*)    INR 1.8 (*)    All other components within normal limits  APTT - Abnormal; Notable for the following components:   aPTT 50 (*)    All other components within normal limits  LIPID PANEL  HEMOGLOBIN A1C  HEPARIN LEVEL (UNFRACTIONATED)  APTT  I-STAT CG4 LACTIC ACID, ED  TROPONIN I (HIGH SENSITIVITY)  TROPONIN I (HIGH SENSITIVITY)  TROPONIN I (HIGH SENSITIVITY)     MDM   Clinical Course as of 06/22/23 1053  Sat Jun 22, 2023  0959 Called and discussed with Dr. Excell Seltzer who reviewed the EKG.  Minimal ST elevation in the inferior leads, questionable STEMI criteria.  After further discussion based on his clinical picture likely plan to go to cardiac Cath Lab.  Will give a bolus of heparin, patient did take his Eliquis today, confirmed heparin bolus with Dr. Excell Seltzer. [SM]    Clinical Course User Index [SM] Corena Herter, MD   Patient currently chest pain-free.  Bedside cardiac ultrasound with no pericardial effusion.  Normal appearing EF and collapsible IVC.  Discussed with Dr. Excell Seltzer who plan to take the patient to Cath Lab, transferred to Cath Lab in stable condition.  PROCEDURES:  Critical Care performed: yes  .Critical Care  Performed by: Corena Herter, MD Authorized by: Corena Herter, MD   Critical care provider statement:    Critical care time (minutes):  45   Critical care time was exclusive of:  Separately billable procedures and treating other patients   Critical care was necessary to treat or prevent imminent or life-threatening deterioration of the following conditions:  Cardiac failure   Critical care was time spent personally by me on the following activities:  Development of treatment plan with patient or surrogate, discussions with consultants, evaluation of patient's response to treatment, examination of patient, ordering and review of laboratory studies, ordering and review of radiographic studies, ordering and performing treatments and interventions, pulse oximetry, re-evaluation of patient's condition and review of old charts   Care discussed with: admitting provider     Patient's presentation is most consistent with acute presentation with potential threat to life or bodily function.   MEDICATIONS ORDERED IN ED: Medications  aspirin chewable tablet 324 mg ( Oral MAR Hold 06/22/23 1036)  sodium chloride flush (NS) 0.9 % injection 3-10 mL ( Intravenous Automatically Held 06/30/23 2200)   sodium chloride flush (NS) 0.9 % injection 3-10 mL ( Intravenous MAR Hold 06/22/23 1036)  heparin ADULT infusion 100 units/mL (25000 units/24mL) (1,100 Units/hr Intravenous New Bag/Given 06/22/23 1018)  midazolam (VERSED) injection (1 mg Intravenous Given 06/22/23 1047)  fentaNYL (SUBLIMAZE) injection (25 mcg Intravenous Given 06/22/23 1047)  lidocaine (PF) (XYLOCAINE) 1 % injection (2 mLs Infiltration Given 06/22/23 1047)  heparin sodium (porcine) injection (4,000 Units Intravenous Given 06/22/23 1051)  heparin injection 4,000 Units (4,000 Units Intravenous Given 06/22/23 1004)  sodium chloride 0.9 % bolus 500 mL (500 mLs Intravenous New Bag/Given 06/22/23 1020)    FINAL CLINICAL IMPRESSION(S) / ED DIAGNOSES   Final diagnoses:  ACS (acute coronary syndrome) (HCC)     Rx / DC Orders   ED Discharge Orders     None        Note:  This document was prepared using Dragon voice recognition software and may include unintentional dictation errors.   Corena Herter, MD 06/22/23 1053

## 2023-06-22 NOTE — ED Notes (Signed)
Rpt EKG done, code STEMI called.

## 2023-06-22 NOTE — Progress Notes (Signed)
   06/22/23 1000  Spiritual Encounters  Type of Visit Attempt (pt unavailable)   Patient being seen by Medical staff at the time. Check in the lobby for family members there was no one present let staff know to page me if family show up. Will return later to check in on patient.

## 2023-06-22 NOTE — Progress Notes (Addendum)
PHARMACY - ANTICOAGULATION CONSULT NOTE  Pharmacy Consult for heparin drip Indication:  NSTEMI  Allergies  Allergen Reactions   Penicillin V Potassium Other (See Comments)   Penicillins Rash    Patient Measurements: Height: 5\' 9"  (175.3 cm) Weight: 98.7 kg (217 lb 9.5 oz) IBW/kg (Calculated) : 70.7 Heparin Dosing Weight: 90.7 kg  Vital Signs: Temp: 98.1 F (36.7 C) (12/28 1129) Temp Source: Oral (12/28 1129) BP: 104/70 (12/28 1129) Pulse Rate: 61 (12/28 1129)  Labs: Recent Labs    06/22/23 0857 06/22/23 0940  HGB 11.6*  --   HCT 36.1*  --   PLT 192  --   APTT  --  50*  LABPROT  --  21.0*  INR  --  1.8*  HEPARINUNFRC  --  >1.10*  CREATININE 1.56*  --   TROPONINIHS 11  --     Estimated Creatinine Clearance: 38.6 mL/min (A) (by C-G formula based on SCr of 1.56 mg/dL (H)).   Medical History: Past Medical History:  Diagnosis Date   Cancer (HCC)    prostate   Hypercholesteremia    Hypertension    Pre-diabetes    Stroke [facility])     Medications:  Medications Prior to Admission  Medication Sig Dispense Refill Last Dose/Taking   acetaminophen (TYLENOL) 500 MG tablet Take 1 tablet (500 mg total) by mouth every 6 (six) hours as needed. 30 tablet 0    apixaban (ELIQUIS) 5 MG TABS tablet Take 1 tablet (5 mg total) by mouth 2 (two) times daily. 120 tablet 3    Cholecalciferol (VITAMIN D) 2000 units CAPS Take 1 capsule (2,000 Units total) by mouth daily. 30 capsule     Cyanocobalamin (B-12) 500 MCG TABS Take 1 tablet by mouth daily. 150 tablet     lisinopril (ZESTRIL) 20 MG tablet Take 1 tablet (20 mg total) by mouth daily. 90 tablet 3    metoprolol succinate (TOPROL XL) 25 MG 24 hr tablet Take 1 tablet (25 mg total) by mouth daily. 90 tablet 3    simvastatin (ZOCOR) 20 MG tablet Take 1 tablet (20 mg total) by mouth daily. 90 tablet 0    Triamcinolone Acetonide (NASACORT ALLERGY 24HR NA) Place into the nose.      Scheduled:   aspirin  324 mg Oral Once   aspirin EC   81 mg Oral Daily   atorvastatin  40 mg Oral Daily   [START ON 06/23/2023] Chlorhexidine Gluconate Cloth  6 each Topical Q0600   metoprolol succinate  25 mg Oral Daily   sodium chloride flush  3 mL Intravenous Q12H   sodium chloride flush  3-10 mL Intravenous Q12H   Infusions:   sodium chloride     sodium chloride     heparin 1,100 Units/hr (06/22/23 1018)    Assessment: 89 yo M w/ chest pain/code STEMI to start heparin drip.  Hx of Afib- on apixaban PTA, prostate Ca, HLD, HTN, stroke, CKD Last dose of apixaban 5mg  12/28 am  ~0700-0800. Planning cardiac cath Baseline labs:  Hgb 11.6  Plt 192  aPTT 50  INR 1.8  HL >1.10  Goal of Therapy:  Heparin level 0.3-0.7 units/ml aPTT 66-102 Monitor platelets by anticoagulation protocol: Yes   Plan:  MD ordered 4000 unit heparin bolus Start heparin infusion at 1100 units/hr ...Marland KitchenMarland KitchenNew consult to start heparin drip 8 hours post sheath removal Will plan to order aPTT level  8 hours after start of drip post sheath removal F/u HL daily to see when correlates with  aPTT and can switch to monitoring HL Continue to monitor H&H and platelets  Jonathan [name] A 06/22/2023,11:59 AM

## 2023-06-22 NOTE — Assessment & Plan Note (Signed)
Blood sugar in 120s  Monitor

## 2023-06-22 NOTE — ED Notes (Signed)
Pt being brought to room. CN aware.

## 2023-06-22 NOTE — Assessment & Plan Note (Signed)
Holding ACEi given baseline stage 3 CKD and large dye load  Otherwise titrate BP regimen

## 2023-06-22 NOTE — Assessment & Plan Note (Addendum)
Admitted earlier today with complaint of chest pain-code STEMI initiated Borderline troponin upon review by cardiology with Dr. Excell Seltzer Status post cardiac catheterization with noted two-vessel disease Differential diagnosis remains fairly broad for chest pain and discussion with Dr. Excell Seltzer including pericarditis and pericardial effusion (noted positional CP relieved w/ sitting up) Pending 2D echo Will continue with pain control and anticoagulation per cardiology recommendations If echo within normal limits and otherwise normal workup over the course of the weekend, tentative plan for repeat catheterization and stenting on 12/30 per Dr. Excell Seltzer Monitor

## 2023-06-22 NOTE — ED Triage Notes (Signed)
Pt brought in from home by EMS for chest pain. Per pt, chest pain started at about 4am today; states it's worse when lying down and worse when taking a deep breath. 324mg  ASA adminstered by EMS.

## 2023-06-22 NOTE — Assessment & Plan Note (Signed)
Noted swelling of lower extremities bilaterally Patient reports has been chronic issue No orthopnea at present Will monitor for now Lower extremity ultrasound x 1 rule out DVT TED hose Diuresis appropriate pending cardiology evaluation

## 2023-06-23 ENCOUNTER — Inpatient Hospital Stay (HOSPITAL_COMMUNITY)
Admit: 2023-06-23 | Discharge: 2023-06-23 | Disposition: A | Discharge status: Home / Self Care | Payer: Medicare HMO | Attending: Cardiovascular Disease | Admitting: Cardiovascular Disease

## 2023-06-23 DIAGNOSIS — E861 Hypovolemia: Secondary | ICD-10-CM | POA: Diagnosis not present

## 2023-06-23 DIAGNOSIS — I4821 Permanent atrial fibrillation: Secondary | ICD-10-CM | POA: Diagnosis not present

## 2023-06-23 DIAGNOSIS — R079 Chest pain, unspecified: Secondary | ICD-10-CM | POA: Diagnosis not present

## 2023-06-23 DIAGNOSIS — I300 Acute nonspecific idiopathic pericarditis: Secondary | ICD-10-CM

## 2023-06-23 DIAGNOSIS — I200 Unstable angina: Secondary | ICD-10-CM | POA: Diagnosis not present

## 2023-06-23 LAB — CBC
HCT: 31.9 % — ABNORMAL LOW (ref 39.0–52.0)
Hemoglobin: 10.4 g/dL — ABNORMAL LOW (ref 13.0–17.0)
MCH: 31 pg (ref 26.0–34.0)
MCHC: 32.6 g/dL (ref 30.0–36.0)
MCV: 94.9 fL (ref 80.0–100.0)
Platelets: 174 10*3/uL (ref 150–400)
RBC: 3.36 MIL/uL — ABNORMAL LOW (ref 4.22–5.81)
RDW: 13.7 % (ref 11.5–15.5)
WBC: 7.8 10*3/uL (ref 4.0–10.5)
nRBC: 0 % (ref 0.0–0.2)

## 2023-06-23 LAB — GLUCOSE, CAPILLARY
Glucose-Capillary: 115 mg/dL — ABNORMAL HIGH (ref 70–99)
Glucose-Capillary: 106 mg/dL — ABNORMAL HIGH (ref 70–99)
Glucose-Capillary: 97 mg/dL (ref 70–99)

## 2023-06-23 LAB — BASIC METABOLIC PANEL
Anion gap: 7 (ref 5–15)
BUN: 34 mg/dL — ABNORMAL HIGH (ref 8–23)
CO2: 21 mmol/L — ABNORMAL LOW (ref 22–32)
Calcium: 7.6 mg/dL — ABNORMAL LOW (ref 8.9–10.3)
Chloride: 109 mmol/L (ref 98–111)
Creatinine, Ser: 1.76 mg/dL — ABNORMAL HIGH (ref 0.61–1.24)
GFR, Estimated: 37 mL/min — ABNORMAL LOW (ref >60)
Glucose, Bld: 149 mg/dL — ABNORMAL HIGH (ref 70–99)
Potassium: 3.9 mmol/L (ref 3.5–5.1)
Sodium: 137 mmol/L (ref 135–145)

## 2023-06-23 LAB — ECHOCARDIOGRAM COMPLETE
AR max vel: 2.37 cm2
AV Area VTI: 2.77 cm2
AV Area mean vel: 2.35 cm2
AV Mean grad: 4 mm[Hg]
AV Peak grad: 10 mm[Hg]
Ao pk vel: 1.58 m/s
Area-P 1/2: 3.33 cm2
Height: 69 in
MV VTI: 2.06 cm2
S' Lateral: 3.7 cm
Weight: 3481.5 [oz_av]

## 2023-06-23 LAB — SEDIMENTATION RATE: Sed Rate: 44 mm/h — ABNORMAL HIGH (ref 0–20)

## 2023-06-23 LAB — APTT
aPTT: 66 s — ABNORMAL HIGH (ref 24–36)
aPTT: 76 s — ABNORMAL HIGH (ref 24–36)

## 2023-06-23 LAB — HEPARIN LEVEL (UNFRACTIONATED): Heparin Unfractionated: >1.1 [IU]/mL — ABNORMAL HIGH (ref 0.30–0.70)

## 2023-06-23 LAB — C-REACTIVE PROTEIN: CRP: 15.8 mg/dL — ABNORMAL HIGH (ref <1.0)

## 2023-06-23 MED ORDER — ATORVASTATIN CALCIUM 80 MG PO TABS
80.0000 mg | ORAL_TABLET | Freq: Every day | ORAL | Status: DC
Start: 2023-06-24 — End: 2023-06-26
  Administered 2023-06-24 – 2023-06-26 (×3): 80 mg via ORAL
  Filled 2023-06-23 (×3): qty 1

## 2023-06-23 MED ORDER — COLCHICINE 0.6 MG PO TABS
0.6000 mg | ORAL_TABLET | Freq: Every day | ORAL | Status: DC
  Administered 2023-06-23 – 2023-06-24 (×2): 0.6 mg via ORAL
  Filled 2023-06-23 (×2): qty 1

## 2023-06-23 MED ORDER — COLCHICINE 0.6 MG PO TABS
0.6000 mg | ORAL_TABLET | Freq: Once | ORAL | Status: AC
  Administered 2023-06-23: 0.6 mg via ORAL
  Filled 2023-06-23: qty 1

## 2023-06-23 MED ORDER — LACTATED RINGERS IV BOLUS
500.0000 mL | Freq: Once | INTRAVENOUS | Status: AC
  Administered 2023-06-23: 500 mL via INTRAVENOUS

## 2023-06-23 MED ORDER — MIDODRINE HCL 5 MG PO TABS
10.0000 mg | ORAL_TABLET | Freq: Once | ORAL | Status: AC
  Administered 2023-06-23: 10 mg via ORAL
  Filled 2023-06-23: qty 2

## 2023-06-23 MED ORDER — POLYETHYLENE GLYCOL 3350 17 G PO PACK
34.0000 g | PACK | ORAL | Status: DC
  Administered 2023-06-23 (×2): 34 g via ORAL
  Filled 2023-06-23 (×3): qty 2

## 2023-06-23 MED ORDER — MIDODRINE HCL 5 MG PO TABS
5.0000 mg | ORAL_TABLET | Freq: Three times a day (TID) | ORAL | Status: DC
  Administered 2023-06-23 – 2023-06-24 (×4): 5 mg via ORAL
  Filled 2023-06-23 (×4): qty 1

## 2023-06-23 NOTE — Progress Notes (Signed)
PHARMACY - ANTICOAGULATION CONSULT NOTE  Pharmacy Consult for heparin drip Indication:  NSTEMI  Allergies  Allergen Reactions   Penicillin V Potassium Other (See Comments)   Penicillins Rash    Patient Measurements: Height: 5\' 9"  (175.3 cm) Weight: 98.7 kg (217 lb 9.5 oz) IBW/kg (Calculated) : 70.7 Heparin Dosing Weight: 90.7 kg  Vital Signs: Temp: 98.9 F (37.2 C) (12/29 0400) Temp Source: Oral (12/29 0800) BP: 83/53 (12/29 1000) Pulse Rate: 58 (12/29 1000)  Labs: Recent Labs    06/22/23 0857 06/22/23 0940 06/22/23 1143 06/22/23 1451 06/23/23 0331  HGB 11.6*  --   --   --  10.4*  HCT 36.1*  --   --   --  31.9*  PLT 192  --   --   --  174  APTT  --  50*  --   --  66*  LABPROT  --  21.0*  --   --   --   INR  --  1.8*  --   --   --   HEPARINUNFRC  --  >1.10*  --   --   --   CREATININE 1.56*  --   --   --  1.76*  TROPONINIHS 11  --  13 15  --     Estimated Creatinine Clearance: 34.3 mL/min (A) (by C-G formula based on SCr of 1.76 mg/dL (H)).   Medical History: Past Medical History:  Diagnosis Date   Cancer (HCC)    prostate   Hypercholesteremia    Hypertension    Pre-diabetes    Stroke [facility])     Medications:  Medications Prior to Admission  Medication Sig Dispense Refill Last Dose/Taking   apixaban (ELIQUIS) 5 MG TABS tablet Take 1 tablet (5 mg total) by mouth 2 (two) times daily. 120 tablet 3 Taking   carvedilol (COREG) 3.125 MG tablet Take 3.125 mg by mouth 2 (two) times daily.   Taking   lisinopril (ZESTRIL) 20 MG tablet Take 1 tablet (20 mg total) by mouth daily. 90 tablet 3 Taking   metoprolol succinate (TOPROL XL) 25 MG 24 hr tablet Take 1 tablet (25 mg total) by mouth daily. 90 tablet 3 Taking   simvastatin (ZOCOR) 20 MG tablet Take 1 tablet (20 mg total) by mouth daily. 90 tablet 0 Taking   acetaminophen (TYLENOL) 500 MG tablet Take 1 tablet (500 mg total) by mouth every 6 (six) hours as needed. 30 tablet 0    Cholecalciferol (VITAMIN D) 2000  units CAPS Take 1 capsule (2,000 Units total) by mouth daily. 30 capsule     Cyanocobalamin (B-12) 500 MCG TABS Take 1 tablet by mouth daily. 150 tablet     Triamcinolone Acetonide (NASACORT ALLERGY 24HR NA) Place into the nose.      Scheduled:   aspirin  324 mg Oral Once   aspirin EC  81 mg Oral Daily   atorvastatin  40 mg Oral Daily   Chlorhexidine Gluconate Cloth  6 each Topical Q0600   colchicine  0.6 mg Oral Daily   colchicine  0.6 mg Oral Once   insulin aspart  0-9 Units Subcutaneous TID WC   sodium chloride flush  3-10 mL Intravenous Q12H   Infusions:   sodium chloride     heparin 1,100 Units/hr (06/23/23 0700)    Assessment: 89 yo M w/ chest pain/code STEMI to start heparin drip.  Hx of Afib- on apixaban PTA, prostate Ca, HLD, HTN, stroke, CKD Last dose of apixaban 5mg  12/28 am  ~  0700-0800. Planning cardiac cath Baseline labs:  Hgb 11.6  Plt 192  aPTT 50  INR 1.8  HL >1.10  Goal of Therapy:  Heparin level 0.3-0.7 units/ml aPTT 66-102 Monitor platelets by anticoagulation protocol: Yes  Plan: aPTT  therapeutic x 2 ---continue heparin infusion at 1100 units/hr ---Recheck aPTT once daily while therapeutic ---F/U heparin level once daily to see when correlates with aPTT and can switch to monitoring HL ---Continue to monitor H&H and platelets  Burnis Medin, PharmD, BCPS  06/23/2023 11:55 AM

## 2023-06-23 NOTE — Plan of Care (Signed)
Problem: Education: Goal: Knowledge of General Education information will improve Description: Including pain rating scale, medication(s)/side effects and non-pharmacologic comfort measures Outcome: Progressing   Problem: Health Behavior/Discharge Planning: Goal: Ability to manage health-related needs will improve Outcome: Progressing   Problem: Clinical Measurements: Goal: Ability to maintain clinical measurements within normal limits will improve Outcome: Progressing Goal: Will remain free from infection Outcome: Progressing Goal: Diagnostic test results will improve Outcome: Progressing Goal: Respiratory complications will improve Outcome: Progressing Goal: Cardiovascular complication will be avoided Outcome: Progressing   Problem: Activity: Goal: Risk for activity intolerance will decrease Outcome: Progressing   Problem: Nutrition: Goal: Adequate nutrition will be maintained Outcome: Progressing   Problem: Coping: Goal: Level of anxiety will decrease Outcome: Progressing   Problem: Elimination: Goal: Will not experience complications related to bowel motility Outcome: Progressing Goal: Will not experience complications related to urinary retention Outcome: Progressing   Problem: Pain Management: Goal: General experience of comfort will improve Outcome: Progressing   Problem: Safety: Goal: Ability to remain free from injury will improve Outcome: Progressing   Problem: Skin Integrity: Goal: Risk for impaired skin integrity will decrease Outcome: Progressing   Problem: Education: Goal: Understanding of CV disease, CV risk reduction, and recovery process will improve Outcome: Progressing Goal: Individualized Educational Video(s) Outcome: Progressing   Problem: Activity: Goal: Ability to return to baseline activity level will improve Outcome: Progressing   Problem: Cardiovascular: Goal: Ability to achieve and maintain adequate cardiovascular perfusion  will improve Outcome: Progressing Goal: Vascular access site(s) Level 0-1 will be maintained Outcome: Progressing   Problem: Health Behavior/Discharge Planning: Goal: Ability to safely manage health-related needs after discharge will improve Outcome: Progressing   Problem: Education: Goal: Ability to describe self-care measures that may prevent or decrease complications (Diabetes Survival Skills Education) will improve Outcome: Progressing Goal: Individualized Educational Video(s) Outcome: Progressing   Problem: Coping: Goal: Ability to adjust to condition or change in health will improve Outcome: Progressing   Problem: Fluid Volume: Goal: Ability to maintain a balanced intake and output will improve Outcome: Progressing   Problem: Health Behavior/Discharge Planning: Goal: Ability to identify and utilize available resources and services will improve Outcome: Progressing Goal: Ability to manage health-related needs will improve Outcome: Progressing   Problem: Metabolic: Goal: Ability to maintain appropriate glucose levels will improve Outcome: Progressing   Problem: Nutritional: Goal: Maintenance of adequate nutrition will improve Outcome: Progressing Goal: Progress toward achieving an optimal weight will improve Outcome: Progressing   Problem: Skin Integrity: Goal: Risk for impaired skin integrity will decrease Outcome: Progressing   Problem: Tissue Perfusion: Goal: Adequacy of tissue perfusion will improve Outcome: Progressing   Problem: Education: Goal: Understanding of cardiac disease, CV risk reduction, and recovery process will improve Outcome: Progressing Goal: Individualized Educational Video(s) Outcome: Progressing   Problem: Activity: Goal: Ability to tolerate increased activity will improve Outcome: Progressing   Problem: Cardiac: Goal: Ability to achieve and maintain adequate cardiovascular perfusion will improve Outcome: Progressing   Problem:  Health Behavior/Discharge Planning: Goal: Ability to safely manage health-related needs after discharge will improve Outcome: Progressing   Problem: Education: Goal: Understanding of CV disease, CV risk reduction, and recovery process will improve Outcome: Progressing Goal: Individualized Educational Video(s) Outcome: Progressing   Problem: Activity: Goal: Ability to return to baseline activity level will improve Outcome: Progressing   Problem: Cardiovascular: Goal: Ability to achieve and maintain adequate cardiovascular perfusion will improve Outcome: Progressing Goal: Vascular access site(s) Level 0-1 will be maintained Outcome: Progressing   Problem: Health Behavior/Discharge Planning: Goal:  Ability to safely manage health-related needs after discharge will improve Outcome: Progressing

## 2023-06-23 NOTE — Progress Notes (Addendum)
Patient Name: Jonathan [name] Date of Encounter: 06/23/2023 [facility] Cardiologist: Jonathan Odea, MD   Interval Summary  .    Feeling well.  Chest pain in 1-2/10.  Confused about why his BP is low when it is usually elevated.   Vital Signs .    Vitals:   06/23/23 0700 06/23/23 0800 06/23/23 0900 06/23/23 1000  BP: (!) 90/52 (!) 84/42 (!) 106/58 (!) 83/53  Pulse: (!) 53 (!) 58 60 (!) 58  Resp: 19 16 18  (!) 23  Temp:      TempSrc:  Oral    SpO2: 96% 96% 95% 93%  Weight:      Height:        Intake/Output Summary (Last 24 hours) at 06/23/2023 1038 Last data filed at 06/23/2023 0811 Gross per 24 hour  Intake 2374.22 ml  Output 1700 ml  Net 674.22 ml      06/22/2023   11:29 AM 06/22/2023    8:52 AM 03/22/2023   10:11 AM  Last 3 Weights  Weight (lbs) 217 lb 9.5 oz 212 lb 221 lb  Weight (kg) 98.7 kg 96.163 kg 100.245 kg      Telemetry/ECG    Atrial fibrillation.  PVCs.  Rate 50-70s - Personally Reviewed  LHC 06/22/23:  1.  Two-vessel coronary artery disease with moderately severe stenosis of the proximal right coronary artery and severe stenosis of the proximal LAD 2.  Patency of the left main and left circumflex with no obstructive disease 3.  Normal LVEDP   Recommendations: Recommend IV fluid hydration, repeat renal function labs, hold Eliquis and use IV heparin, check 2D echocardiogram to rule out pericardial effusion, check CRP and sed rate to evaluate for pericarditis, consider staged PCI on Monday if renal function stable and no pericardial effusion seen on echo.  While I think his coronary lesions are hemodynamically significant, it is not clear that he has a plaque rupture or culprit lesion.  Plan discussed with hospitalist team and patient's family over the telephone.  Physical Exam .    VS:  BP (!) 83/53   Pulse (!) 58   Temp 98.9 F (37.2 C) (Oral)   Resp (!) 23   Ht 5\' 9"  (1.753 m)   Wt 98.7 kg   SpO2 93%   BMI 32.13 kg/m  , BMI  Body mass index is 32.13 kg/m. GENERAL:  Well appearing HEENT: Pupils equal round and reactive, fundi not visualized, oral mucosa unremarkable NECK: JVP 2cm above clavicle sitting upright.  waveform within normal limits, carotid upstroke brisk and symmetric, no bruits, no thyromegaly LUNGS:  Clear to auscultation bilaterally HEART:  RRR.  PMI not displaced or sustained,S1 and S2 within normal limits, no S3, no S4, no clicks, no rubs, no murmurs ABD:  Flat, positive bowel sounds normal in frequency in pitch, no bruits, no rebound, no guarding, no midline pulsatile mass, no hepatomegaly, no splenomegaly EXT:  2 plus pulses throughout, 2+  LE edema, no cyanosis no clubbing SKIN:  No rashes no nodules NEURO:  Cranial nerves II through XII grossly intact, motor grossly intact throughout PSYCH:  Cognitively intact, oriented to person place and time   Assessment & Plan .     26M with permanent atrial fibrillation admitted with pleuritic CP initially concerning for STEMI.  Underwent cath and found to have significant 2 vessel disease but no STEMI.  # Pericarditis: Patient initially presented as STEMI.  Found to have pericarditis.  ESR and CRP both elevated.  His chest pain is improving.  Echo pending.  I am concerned about his low blood pressure and possible tamponade.  Clinically he looks very well.  However it does not make sense that his blood pressures still low.  He reports having sweats and chills but no other viral symptoms.  Will start colchicine 0.6 mg twice daily today then daily for 3 months.  Given that his pain is almost gone we will not start NSAIDs at this time.  Will check viral panel.   # Obstructive CAD:  # Hyperlipidemia: Jonathan [name] was found to have 80% proximal LAD and 70% ostial RCA disease.  40% LCx.  Planning to go for staged PCI after Eliquis wash out and once renal function improves.  LDL was 100 on 02/2023 and 78 this admission.  Increase atorvastatin to 80 mg.  Continue  aspirin 81 mg.  Repeat lipids and a CMP in 2 to 3 months.  # AKI: Creatinine on admission was 1.39.  In the past it was around 1.  Today it is up to 1.76.  Unclear if this is due to hypotension and poor perfusion or possibly contrast related.  Overall he seems volume overloaded.  Will hold off on giving more of IV fluids until we get his echocardiogram.  # Hypotension: Patient is hypertensive at baseline.  Currently being treated for pericarditis.  Oral intake is poor due to not liking hospital food.  Encourage hydration.   Getting echo as above.  Continue midodrine for now and my need more IVF.   Will add TED hose.   # Permanent atrial fibrillation:  Rates are controlled.  Holding home Eliquis for cath.     For questions or updates, please contact [facility] Please consult [email or web address] for contact info under        Signed, Jonathan Si, MD

## 2023-06-23 NOTE — Progress Notes (Signed)
° °      CROSS COVER NOTE  NAME: Jonathan [name] MRN: [id number] DOB : [birth year]    Concern as stated by nurse / staff   Message received from [place] RN BP is now 78/46, map 58. Remains chronic afib at 60. Awake and alert, mentation at baseline. 2/10 "chest pain" that was determined to be likely non-cardiac by cardiologist (pain that increases with deep breathing) that has been the since yesterday.      Pertinent findings on chart review: AKI on morning labs  Assessment and  Interventions   Assessment:    06/23/2023    5:20 AM 06/23/2023    5:06 AM 06/23/2023    5:00 AM  Vitals with BMI  Systolic 80 78 82  Diastolic 42 46 45  Pulse  80 62    Plan: 500 ml LR bolus       Donnie Mesa NP [facility] Cross Cover 7pm-7am - check amion for availability Pager [phone]

## 2023-06-23 NOTE — Progress Notes (Signed)
°  PROGRESS NOTE    Jonathan [name]  ONG:[id number] DOB: August 03, 1933 DOA: 06/22/2023 PCP: Marina Goodell, MD  [id number]  LOS: 1 day   Brief hospital course:   Assessment & Plan: Jonathan [name] is a 89 y.o. male with medical history significant of CVA, atrial fibrillation, hypertension, hyperlipidemia, stage III CKD, history of prostate cancer, type 2 diabetes presenting with chest pain.    Chest pain 2/2 Pericarditis --Patient initially presented as STEMI.  Found to have pericarditis per cardio.  ESR and CRP both elevated.  His chest pain is improving.  --start colchicine as 0.6 mg BID today, then 0.6 [name] starting tomorrow for 3 months. --hold NSAIDs since chest pain significantly improved.  # Obstructive CAD:  # Hyperlipidemia: --heart cath on 12/28 found to have 80% proximal LAD and 70% ostial RCA disease.  40% LCx.  LDL was 100 on 02/2023 and 78 this admission. --plan for staged PCI after Eliquis washout --cont heparin gtt --Increase atorvastatin to 80 mg.   --Continue aspirin 81 mg.   --Repeat lipids and a CMP in 2 to 3 months.  Hypotension --systolic 80's-90's.   --hold home Toprol due to hypotension --start midodrine  Permanent Atrial fibrillation (HCC) --rate controlled. --hold home Toprol due to hypotension --hold home Eliquis due to upcoming cath  CVA (cerebral vascular accident) [facility]) Recent evaluation 02/2023 for CVA w/ prior MRI demonstrating 9 mm acute ischemic infarct involving the superior right insula. --Increase atorvastatin to 80 mg.   --Continue aspirin 81 mg.    Chronic kidney disease, stage 3a (HCC) AKI ruled out Creatinine 1.56 with GFR in 40s Near baseline Monitor renal function with recent dye load  Prediabetes --recent A1c 6.0 --d/c BG checks and SSI  Bilateral Swelling of lower extremity Patient reports has been chronic issue.   No orthopnea at present.  [place] neg for DVT. --compression stocking   DVT prophylaxis: [id number]  gtt Code Status: Full code  Family Communication:  Level of care: Progressive Dispo:   The patient is from: home Anticipated d/c is to: home Anticipated d/c date is: after Monday Patient currently is not medically ready to d/c due to: pending heart cath   Subjective and Interval History:  Pt reported chest pain improved.   Objective: Vitals:   06/23/23 0900 06/23/23 1000 06/23/23 1100 06/23/23 1200  BP: (!) 106/58 (!) 83/53 (!) 98/54 118/75  Pulse: 60 (!) 58 (!) 52 (!) 56  Resp: 18 (!) 23 13 14   Temp:      TempSrc:      SpO2: 95% 93% 94% 98%  Weight:      Height:        Intake/Output Summary (Last 24 hours) at 06/23/2023 1759 Last data filed at 06/23/2023 0811 Gross per 24 hour  Intake 1920.04 ml  Output 1050 ml  Net 870.04 ml   Filed Weights   06/22/23 0852 06/22/23 1129  Weight: 96.2 kg 98.7 kg    Examination:   Constitutional: NAD, AAOx3 HEENT: conjunctivae and lids normal, EOMI CV: No cyanosis.   RESP: normal respiratory effort, on RA Extremities: edema in BLE SKIN: warm, dry Neuro: II - XII grossly intact.   Psych: Normal mood and affect.  Appropriate judgement and reason   Data Reviewed: I have personally reviewed labs and imaging studies  Time spent: 50 minutes  Darlin Priestly, MD [facility] If 7PM-7AM, please contact night-coverage 06/23/2023, 5:59 PM

## 2023-06-23 NOTE — Progress Notes (Signed)
PHARMACY - ANTICOAGULATION CONSULT NOTE  Pharmacy Consult for heparin drip Indication:  NSTEMI  Allergies  Allergen Reactions   Penicillin V Potassium Other (See Comments)   Penicillins Rash    Patient Measurements: Height: 5\' 9"  (175.3 cm) Weight: 98.7 kg (217 lb 9.5 oz) IBW/kg (Calculated) : 70.7 Heparin Dosing Weight: 90.7 kg  Vital Signs: Temp: 98.9 F (37.2 C) (12/29 0400) Temp Source: Oral (12/29 0400) BP: 83/52 (12/29 0600) Pulse Rate: 64 (12/29 0600)  Labs: Recent Labs    06/22/23 0857 06/22/23 0940 06/22/23 1143 06/22/23 1451 06/23/23 0331  HGB 11.6*  --   --   --  10.4*  HCT 36.1*  --   --   --  31.9*  PLT 192  --   --   --  174  APTT  --  50*  --   --  66*  LABPROT  --  21.0*  --   --   --   INR  --  1.8*  --   --   --   HEPARINUNFRC  --  >1.10*  --   --   --   CREATININE 1.56*  --   --   --  1.76*  TROPONINIHS 11  --  13 15  --     Estimated Creatinine Clearance: 34.3 mL/min (A) (by C-G formula based on SCr of 1.76 mg/dL (H)).   Medical History: Past Medical History:  Diagnosis Date   Cancer (HCC)    prostate   Hypercholesteremia    Hypertension    Pre-diabetes    Stroke [facility])     Medications:  Medications Prior to Admission  Medication Sig Dispense Refill Last Dose/Taking   apixaban (ELIQUIS) 5 MG TABS tablet Take 1 tablet (5 mg total) by mouth 2 (two) times daily. 120 tablet 3 Taking   carvedilol (COREG) 3.125 MG tablet Take 3.125 mg by mouth 2 (two) times daily.   Taking   lisinopril (ZESTRIL) 20 MG tablet Take 1 tablet (20 mg total) by mouth daily. 90 tablet 3 Taking   metoprolol succinate (TOPROL XL) 25 MG 24 hr tablet Take 1 tablet (25 mg total) by mouth daily. 90 tablet 3 Taking   simvastatin (ZOCOR) 20 MG tablet Take 1 tablet (20 mg total) by mouth daily. 90 tablet 0 Taking   acetaminophen (TYLENOL) 500 MG tablet Take 1 tablet (500 mg total) by mouth every 6 (six) hours as needed. 30 tablet 0    Cholecalciferol (VITAMIN D) 2000  units CAPS Take 1 capsule (2,000 Units total) by mouth daily. 30 capsule     Cyanocobalamin (B-12) 500 MCG TABS Take 1 tablet by mouth daily. 150 tablet     Triamcinolone Acetonide (NASACORT ALLERGY 24HR NA) Place into the nose.      Scheduled:   aspirin  324 mg Oral Once   aspirin EC  81 mg Oral Daily   atorvastatin  40 mg Oral Daily   Chlorhexidine Gluconate Cloth  6 each Topical Q0600   insulin aspart  0-9 Units Subcutaneous TID WC   sodium chloride flush  3 mL Intravenous Q12H   sodium chloride flush  3-10 mL Intravenous Q12H   Infusions:   sodium chloride     heparin 1,100 Units/hr (06/23/23 0600)    Assessment: 89 yo M w/ chest pain/code STEMI to start heparin drip.  Hx of Afib- on apixaban PTA, prostate Ca, HLD, HTN, stroke, CKD Last dose of apixaban 5mg  12/28 am  ~0700-0800. Planning cardiac cath Baseline  labs:  Hgb 11.6  Plt 192  aPTT 50  INR 1.8  HL >1.10  Goal of Therapy:  Heparin level 0.3-0.7 units/ml aPTT 66-102 Monitor platelets by anticoagulation protocol: Yes  12/29 0331 aPTT 66, therapeutic x 1   Plan:  Labs delays d/t equipment issues Continue heparin infusion at 1100 units/hr Recheck aPTT in 8 hr to confirm F/U HL daily to see when correlates with aPTT and can switch to monitoring HL Continue to monitor H&H and platelets  Otelia Sergeant, PharmD, [facility] 06/23/2023 6:54 AM

## 2023-06-23 NOTE — Progress Notes (Signed)
*  PRELIMINARY RESULTS* Echocardiogram 2D Echocardiogram has been performed.  Jonathan [name] 06/23/2023, 2:15 PM

## 2023-06-24 ENCOUNTER — Encounter: Payer: Self-pay | Admitting: Cardiovascular Disease

## 2023-06-24 DIAGNOSIS — I300 Acute nonspecific idiopathic pericarditis: Secondary | ICD-10-CM | POA: Diagnosis not present

## 2023-06-24 DIAGNOSIS — I25110 Atherosclerotic heart disease of native coronary artery with unstable angina pectoris: Secondary | ICD-10-CM | POA: Diagnosis not present

## 2023-06-24 DIAGNOSIS — I200 Unstable angina: Secondary | ICD-10-CM | POA: Diagnosis not present

## 2023-06-24 LAB — CBC
HCT: 32.7 % — ABNORMAL LOW (ref 39.0–52.0)
Hemoglobin: 10.6 g/dL — ABNORMAL LOW (ref 13.0–17.0)
MCH: 30.9 pg (ref 26.0–34.0)
MCHC: 32.4 g/dL (ref 30.0–36.0)
MCV: 95.3 fL (ref 80.0–100.0)
Platelets: 169 10*3/uL (ref 150–400)
RBC: 3.43 MIL/uL — ABNORMAL LOW (ref 4.22–5.81)
RDW: 13.8 % (ref 11.5–15.5)
WBC: 6.1 10*3/uL (ref 4.0–10.5)
nRBC: 0 % (ref 0.0–0.2)

## 2023-06-24 LAB — APTT: aPTT: 77 s — ABNORMAL HIGH (ref 24–36)

## 2023-06-24 LAB — LIPOPROTEIN A (LPA): Lipoprotein (a): 126.8 nmol/L — ABNORMAL HIGH (ref <75.0)

## 2023-06-24 LAB — BASIC METABOLIC PANEL
Anion gap: 11 (ref 5–15)
BUN: 35 mg/dL — ABNORMAL HIGH (ref 8–23)
CO2: 19 mmol/L — ABNORMAL LOW (ref 22–32)
Calcium: 7.9 mg/dL — ABNORMAL LOW (ref 8.9–10.3)
Chloride: 104 mmol/L (ref 98–111)
Creatinine, Ser: 1.57 mg/dL — ABNORMAL HIGH (ref 0.61–1.24)
GFR, Estimated: 42 mL/min — ABNORMAL LOW (ref >60)
Glucose, Bld: 113 mg/dL — ABNORMAL HIGH (ref 70–99)
Potassium: 3.6 mmol/L (ref 3.5–5.1)
Sodium: 134 mmol/L — ABNORMAL LOW (ref 135–145)

## 2023-06-24 LAB — MAGNESIUM: Magnesium: 2 mg/dL (ref 1.7–2.4)

## 2023-06-24 LAB — HEPARIN LEVEL (UNFRACTIONATED): Heparin Unfractionated: 0.7 [IU]/mL (ref 0.30–0.70)

## 2023-06-24 LAB — HEMOGLOBIN A1C
Hgb A1c MFr Bld: 6 % — ABNORMAL HIGH (ref 4.8–5.6)
Mean Plasma Glucose: 126 mg/dL

## 2023-06-24 MED ORDER — SODIUM CHLORIDE 0.9 % WEIGHT BASED INFUSION
3.0000 mL/kg/h | INTRAVENOUS | Status: DC
  Administered 2023-06-25: 3 mL/kg/h via INTRAVENOUS

## 2023-06-24 MED ORDER — ASPIRIN 81 MG PO CHEW
81.0000 mg | CHEWABLE_TABLET | ORAL | Status: AC
  Administered 2023-06-25: 81 mg via ORAL
  Filled 2023-06-24: qty 1

## 2023-06-24 MED ORDER — SODIUM CHLORIDE 0.9 % WEIGHT BASED INFUSION
1.0000 mL/kg/h | INTRAVENOUS | Status: DC
  Administered 2023-06-25 (×2): 1 mL/kg/h via INTRAVENOUS

## 2023-06-24 MED ORDER — ASPIRIN 81 MG PO TBEC: 81.0000 mg | DELAYED_RELEASE_TABLET | Freq: Every day | ORAL | Status: DC

## 2023-06-24 MED ORDER — COLCHICINE 0.6 MG PO TABS: 0.6000 mg | ORAL_TABLET | Freq: Two times a day (BID) | ORAL | Status: DC

## 2023-06-24 MED ORDER — COLCHICINE 0.6 MG PO TABS
0.6000 mg | ORAL_TABLET | Freq: Every day | ORAL | Status: DC
  Administered 2023-06-25 – 2023-06-26 (×2): 0.6 mg via ORAL
  Filled 2023-06-24 (×3): qty 1

## 2023-06-24 MED ORDER — CLOPIDOGREL BISULFATE 300 MG PO TABS
600.0000 mg | ORAL_TABLET | Freq: Once | ORAL | Status: AC
  Administered 2023-06-24: 600 mg via ORAL
  Filled 2023-06-24: qty 2

## 2023-06-24 MED ORDER — CLOPIDOGREL BISULFATE 75 MG PO TABS
75.0000 mg | ORAL_TABLET | Freq: Every day | ORAL | Status: DC
  Administered 2023-06-25 – 2023-06-26 (×2): 75 mg via ORAL
  Filled 2023-06-24 (×2): qty 1

## 2023-06-24 NOTE — Plan of Care (Signed)
  Problem: Coping: Goal: Level of anxiety will decrease Outcome: Progressing   Problem: Elimination: Goal: Will not experience complications related to bowel motility Outcome: Progressing   Problem: Pain Management: Goal: General experience of comfort will improve Outcome: Progressing   Problem: Education: Goal: Understanding of CV disease, CV risk reduction, and recovery process will improve Outcome: Progressing   Problem: Cardiovascular: Goal: Ability to achieve and maintain adequate cardiovascular perfusion will improve Outcome: Progressing Goal: Vascular access site(s) Level 0-1 will be maintained Outcome: Progressing

## 2023-06-24 NOTE — Progress Notes (Signed)
  PROGRESS NOTE    Jonathan [name]  GMW:[id number] DOB: 18-Jan-1934 DOA: 06/22/2023 PCP: Marina Goodell, MD  [id number]  LOS: 2 days   Brief hospital course:   Assessment & Plan: Jonathan [name] is a 89 y.o. male with medical history significant of CVA, atrial fibrillation, hypertension, hyperlipidemia, stage III CKD, history of prostate cancer, type 2 diabetes presenting with chest pain.    Chest pain 2/2 Pericarditis --Patient initially presented as STEMI.  Found to have pericarditis per cardio.  ESR and CRP both elevated.  His chest pain is improving.  --cont colchicine as 0.6 mg BID   --hold NSAIDs since chest pain significantly improved.  # Obstructive CAD:  # Hyperlipidemia: --heart cath on 12/28 found to have 80% proximal LAD and 70% ostial RCA disease.  40% LCx.  LDL was 100 on 02/2023 and 78 this admission. --plan for staged PCI after Eliquis washout, scheduled for tomorrow --cont heparin gtt --Increase atorvastatin to 80 mg.   --Continue aspirin 81 mg.   --Repeat lipids and a CMP in 2 to 3 months.  Hypotension --systolic 80's-90's.   --hold home Toprol due to hypotension --started midodrine  Permanent Atrial fibrillation (HCC) --rate controlled. --hold home Toprol due to hypotension --hold home Eliquis due to upcoming cath, continue heparin  CVA (cerebral vascular accident) [facility]) Recent evaluation 02/2023 for CVA w/ prior MRI demonstrating 9 mm acute ischemic infarct involving the superior right insula. --Increase atorvastatin to 80 mg (ldl 78) --Continue aspirin 81 mg.    Chronic kidney disease, stage 3a (HCC) AKI ruled out Creatinine 1.56 with GFR in 40s Near baseline Monitor renal function with recent dye load  Prediabetes --recent A1c 6.0 --d/c BG checks and SSI  Bilateral Swelling of lower extremity Patient reports has been chronic issue.   No orthopnea at present.  [place] neg for DVT. --compression stocking   DVT prophylaxis: [id number]  gtt Code Status: Full code  Family Communication:  Level of care: Progressive Dispo:   The patient is from: home Anticipated d/c is to: home Anticipated d/c date is: tomorrow or next day depending on cath timing and results   Subjective and Interval History:  Pt reported chest pain improving but still with some pain with deep inspiration   Objective: Vitals:   06/24/23 0500 06/24/23 0600 06/24/23 0700 06/24/23 0800  BP: 102/62 120/64 107/63 (!) 115/56  Pulse: 62 64 60 61  Resp: (!) 22 (!) 24 (!) 21 (!) 22  Temp:    98.7 F (37.1 C)  TempSrc:    Oral  SpO2: 92% 96% 93% 95%  Weight:      Height:        Intake/Output Summary (Last 24 hours) at 06/24/2023 1342 Last data filed at 06/24/2023 1321 Gross per 24 hour  Intake 269.38 ml  Output 1500 ml  Net -1230.62 ml   Filed Weights   06/22/23 0852 06/22/23 1129  Weight: 96.2 kg 98.7 kg    Examination:   Constitutional: NAD, AAOx3 HEENT: conjunctivae and lids normal, EOMI CV: No cyanosis.  Rrr, no murmur RESP: normal respiratory effort, on RA Extremities: edema in BLE, stable SKIN: warm, dry Neuro: moving all 4 Psych: Normal mood and affect.  Appropriate judgement and reason   Data Reviewed: I have personally reviewed labs and imaging studies   Silvano Bilis, MD [facility] If 7PM-7AM, please contact night-coverage 06/24/2023, 1:42 PM

## 2023-06-24 NOTE — Progress Notes (Signed)
PHARMACY - ANTICOAGULATION CONSULT NOTE  Pharmacy Consult for heparin drip Indication:  NSTEMI  Allergies  Allergen Reactions   Penicillin V Potassium Other (See Comments)   Penicillins Rash    Patient Measurements: Height: 5\' 9"  (175.3 cm) Weight: 98.7 kg (217 lb 9.5 oz) IBW/kg (Calculated) : 70.7 Heparin Dosing Weight: 90.7 kg  Vital Signs: Temp: 98 F (36.7 C) (12/30 0327) Temp Source: Oral (12/30 0327) BP: 104/64 (12/30 0400) Pulse Rate: 73 (12/30 0400)  Labs: Recent Labs    06/22/23 0857 06/22/23 0857 06/22/23 0940 06/22/23 1143 06/22/23 1451 06/23/23 0331 06/23/23 1139 06/24/23 0353  HGB 11.6*  --   --   --   --  10.4*  --  10.6*  HCT 36.1*  --   --   --   --  31.9*  --  32.7*  PLT 192  --   --   --   --  174  --  169  APTT  --    < > 50*  --   --  66* 76* 77*  LABPROT  --   --  21.0*  --   --   --   --   --   INR  --   --  1.8*  --   --   --   --   --   HEPARINUNFRC  --   --  >1.10*  --   --   --  >1.10* 0.70  CREATININE 1.56*  --   --   --   --  1.76*  --  1.57*  TROPONINIHS 11  --   --  13 15  --   --   --    < > = values in this interval not displayed.    Estimated Creatinine Clearance: 38.4 mL/min (A) (by C-G formula based on SCr of 1.57 mg/dL (H)).   Medical History: Past Medical History:  Diagnosis Date   Cancer (HCC)    prostate   Hypercholesteremia    Hypertension    Pre-diabetes    Stroke [facility])     Medications:  Medications Prior to Admission  Medication Sig Dispense Refill Last Dose/Taking   apixaban (ELIQUIS) 5 MG TABS tablet Take 1 tablet (5 mg total) by mouth 2 (two) times daily. 120 tablet 3 Taking   carvedilol (COREG) 3.125 MG tablet Take 3.125 mg by mouth 2 (two) times daily.   Taking   lisinopril (ZESTRIL) 20 MG tablet Take 1 tablet (20 mg total) by mouth daily. 90 tablet 3 Taking   metoprolol succinate (TOPROL XL) 25 MG 24 hr tablet Take 1 tablet (25 mg total) by mouth daily. 90 tablet 3 Taking   simvastatin (ZOCOR) 20 MG  tablet Take 1 tablet (20 mg total) by mouth daily. 90 tablet 0 Taking   acetaminophen (TYLENOL) 500 MG tablet Take 1 tablet (500 mg total) by mouth every 6 (six) hours as needed. 30 tablet 0    Cholecalciferol (VITAMIN D) 2000 units CAPS Take 1 capsule (2,000 Units total) by mouth daily. 30 capsule     Cyanocobalamin (B-12) 500 MCG TABS Take 1 tablet by mouth daily. 150 tablet     Triamcinolone Acetonide (NASACORT ALLERGY 24HR NA) Place into the nose.      Scheduled:   aspirin  324 mg Oral Once   aspirin EC  81 mg Oral Daily   atorvastatin  80 mg Oral Daily   Chlorhexidine Gluconate Cloth  6 each Topical Q0600   colchicine  0.6 mg Oral Daily   midodrine  5 mg Oral TID WC   sodium chloride flush  3-10 mL Intravenous Q12H   Infusions:   heparin 1,100 Units/hr (06/24/23 0406)    Assessment: 89 yo M w/ chest pain/code STEMI to start heparin drip.  Hx of Afib- on apixaban PTA, prostate Ca, HLD, HTN, stroke, CKD Last dose of apixaban 5mg  12/28 am  ~0700-0800. Planning cardiac cath Baseline labs:  Hgb 11.6  Plt 192  aPTT 50  INR 1.8  HL >1.10  Goal of Therapy:  Heparin level 0.3-0.7 units/ml aPTT 66-102 Monitor platelets by anticoagulation protocol: Yes  Plan: aPTT  therapeutic x 3 ---continue heparin infusion at 1100 units/hr ---Recheck aPTT once daily while therapeutic ---F/U heparin level once daily to see when correlates with aPTT and can switch to monitoring HL ---Continue to monitor H&H and platelets  Otelia Sergeant, PharmD, [facility] 06/24/2023 5:11 AM

## 2023-06-24 NOTE — Progress Notes (Signed)
Cardiology Progress Note   Patient Name: Jonathan [name] Date of Encounter: 06/24/2023  Primary Cardiologist: Debbe Odea, MD  Subjective   Feels well this AM.  Occasional c/p w/ deep breath. Objective   Inpatient Medications    Scheduled Meds:  aspirin EC  81 mg Oral Daily   atorvastatin  80 mg Oral Daily   Chlorhexidine Gluconate Cloth  6 each Topical Q0600   colchicine  0.6 mg Oral Daily   midodrine  5 mg Oral TID WC   sodium chloride flush  3-10 mL Intravenous Q12H   Continuous Infusions:  heparin 1,100 Units/hr (06/24/23 0729)   PRN Meds: acetaminophen, nitroGLYCERIN, ondansetron (ZOFRAN) IV, mouth rinse, oxyCODONE-acetaminophen, sodium chloride flush   Vital Signs    Vitals:   06/24/23 0500 06/24/23 0600 06/24/23 0700 06/24/23 0800  BP: 102/62 120/64 107/63 (!) 115/56  Pulse: 62 64 60 61  Resp: (!) 22 (!) 24 (!) 21 (!) 22  Temp:    98.7 F (37.1 C)  TempSrc:    Oral  SpO2: 92% 96% 93% 95%  Weight:      Height:        Intake/Output Summary (Last 24 hours) at 06/24/2023 1029 Last data filed at 06/24/2023 0729 Gross per 24 hour  Intake 269.38 ml  Output 1100 ml  Net -830.62 ml   Filed Weights   06/22/23 0852 06/22/23 1129  Weight: 96.2 kg 98.7 kg    Physical Exam   GEN: Well nourished, well developed, in no acute distress.  HEENT: Grossly normal.  Neck: Supple, no JVD, carotid bruits, or masses. Cardiac: IR, IR, brady, no murmurs, rubs, or gallops. No clubbing, cyanosis, edema.  Radials 2+, DP/PT 2+ and equal bilaterally.  R radial cath site w/o bleeding/bruit/hematoma. Respiratory:  Respirations regular and unlabored, diminished breath sounds bilat bases. GI: Soft, nontender, nondistended, BS + x 4. MS: no deformity or atrophy. Skin: warm and dry, no rash. Neuro:  Strength and sensation are intact. Psych: AAOx3.  Normal affect.  Labs    Chemistry Recent Labs  Lab 06/22/23 0857 06/23/23 0331 06/24/23 0353  NA 138 137 134*  K 4.2  3.9 3.6  CL 107 109 104  CO2 22 21* 19*  GLUCOSE 120* 149* 113*  BUN 33* 34* 35*  CREATININE 1.56* 1.76* 1.57*  CALCIUM 8.3* 7.6* 7.9*  GFRNONAA 43* 37* 42*  ANIONGAP 9 7 11      Hematology Recent Labs  Lab 06/22/23 0857 06/23/23 0331 06/24/23 0353  WBC 8.8 7.8 6.1  RBC 3.77* 3.36* 3.43*  HGB 11.6* 10.4* 10.6*  HCT 36.1* 31.9* 32.7*  MCV 95.8 94.9 95.3  MCH 30.8 31.0 30.9  MCHC 32.1 32.6 32.4  RDW 13.8 13.7 13.8  PLT 192 174 169    Cardiac Enzymes  Recent Labs  Lab 06/22/23 0857 06/22/23 1143 06/22/23 1451  TROPONINIHS 11 13 15      Lipids  Lab Results  Component Value Date   CHOL 152 06/22/2023   HDL 63 06/22/2023   LDLCALC 78 06/22/2023   TRIG 56 06/22/2023   CHOLHDL 2.4 06/22/2023    HbA1c  Lab Results  Component Value Date   HGBA1C 6.0 (H) 06/22/2023    Radiology    US Venous Img Lower Bilateral (DVT) Result Date: 06/22/2023 CLINICAL DATA:  Lower extremity edema EXAM: BILATERAL LOWER EXTREMITY VENOUS DOPPLER ULTRASOUND TECHNIQUE: Gray-scale sonography with graded compression, as well as color Doppler and duplex ultrasound were performed to evaluate the lower extremity deep venous systems from  the level of the common femoral vein and including the common femoral, femoral, profunda femoral, popliteal and calf veins including the posterior tibial, peroneal and gastrocnemius veins when visible. The superficial great saphenous vein was also interrogated. Spectral Doppler was utilized to evaluate flow at rest and with distal augmentation maneuvers in the common femoral, femoral and popliteal veins. COMPARISON:  None Available. FINDINGS: RIGHT LOWER EXTREMITY Common Femoral Vein: No evidence of thrombus. Normal compressibility, respiratory phasicity and response to augmentation. Saphenofemoral Junction: No evidence of thrombus. Normal compressibility and flow on color Doppler imaging. Profunda Femoral Vein: No evidence of thrombus. Normal compressibility and flow on  color Doppler imaging. Femoral Vein: No evidence of thrombus. Normal compressibility, respiratory phasicity and response to augmentation. Popliteal Vein: No evidence of thrombus. Normal compressibility, respiratory phasicity and response to augmentation. Calf Veins: No evidence of thrombus. Normal compressibility and flow on color Doppler imaging. Superficial Great Saphenous Vein: No evidence of thrombus. Normal compressibility. Venous Reflux:  None. Other Findings:  None. LEFT LOWER EXTREMITY Common Femoral Vein: No evidence of thrombus. Normal compressibility, respiratory phasicity and response to augmentation. Saphenofemoral Junction: No evidence of thrombus. Normal compressibility and flow on color Doppler imaging. Profunda Femoral Vein: No evidence of thrombus. Normal compressibility and flow on color Doppler imaging. Femoral Vein: No evidence of thrombus. Normal compressibility, respiratory phasicity and response to augmentation. Popliteal Vein: No evidence of thrombus. Normal compressibility, respiratory phasicity and response to augmentation. Calf Veins: No evidence of thrombus. Normal compressibility and flow on color Doppler imaging. Superficial Great Saphenous Vein: No evidence of thrombus. Normal compressibility. Venous Reflux:  None. Other Findings:  None. IMPRESSION: No evidence of deep venous thrombosis in either lower extremity. Electronically Signed   By: Malachy Moan M.D.   On: 06/22/2023 15:20   DG Chest 2 View Result Date: 06/22/2023 CLINICAL DATA:  Chest pain. EXAM: CHEST - 2 VIEW COMPARISON:  None Available. FINDINGS: The heart size and mediastinal contours are within normal limits. Minimal bibasilar subsegmental atelectasis or scarring is noted. The visualized skeletal structures are unremarkable. IMPRESSION: Minimal bibasilar subsegmental atelectasis or scarring. Electronically Signed   By: Lupita Raider M.D.   On: 06/22/2023 09:13     Telemetry    Afib, 50s-60s - Personally  Reviewed  Cardiac Studies   Cardiac Catheterization  12.28.2024  Diagnostic Dominance: Right  1.  Two-vessel coronary artery disease with moderately severe stenosis of the proximal right coronary artery and severe stenosis of the proximal LAD 2.  Patency of the left main and left circumflex with no obstructive disease 3.  Normal LVEDP   Recommendations: Recommend IV fluid hydration, repeat renal function labs, hold Eliquis and use IV heparin, check 2D echocardiogram to rule out pericardial effusion, check CRP and sed rate to evaluate for pericarditis, consider staged PCI on Monday if renal function stable and no pericardial effusion seen on echo.  While I think his coronary lesions are hemodynamically significant, it is not clear that he has a plaque rupture or culprit lesion.  Plan discussed with hospitalist team and patient's family over the telephone. _____________   2D Echocardiogram 12.29.2024  1. Left ventricular ejection fraction, by estimation, is 60 to 65%. The  left ventricle has normal function. The left ventricle has no regional  wall motion abnormalities. There is mild concentric left ventricular  hypertrophy. Left ventricular diastolic  parameters were normal.   2. Right ventricular systolic function is normal. The right ventricular  size is normal. There is moderately elevated pulmonary artery  systolic  pressure.   3. Left atrial size was severely dilated.   4. Right atrial size was mildly dilated.   5. The mitral valve is normal in structure. Mild mitral valve  regurgitation. No evidence of mitral stenosis.   6. The aortic valve is tricuspid. There is mild calcification of the  aortic valve. There is mild thickening of the aortic valve. Aortic valve  regurgitation is mild. No aortic stenosis is present.   7. The inferior vena cava is dilated in size with <50% respiratory  variability, suggesting right atrial pressure of 15 mmHg.  _____________  Patient Profile      89 y.o. male w/ a h/o HTN, HL, persistent Afib, Stroke, and CKD III, who was admitted w/ c/p, ST elevation, found to have CAD w/o plaque rupture, nl hsTrops, and acute pericarditis.  Assessment & Plan    1.  Acute pericarditis:  Presented 12/28 w/ chest pain and borderline ECG changes.  Cath w/ CAD (see below) but symptoms more consistent w/ pericarditis.  ESR 44, CRP 15.8.  No effusion on echo.  Mild intermittent c/p w/ deep breathing but overall improved.  Cont colchicine.   2.  CAD:    Cath w/ severe LAD dzs and mod-sev RCA dzs.  No evidence of ruptured plaque and trops nl.  In setting of CKD III and eliquis rx, no PCI performed 12/28 w/ plan for staged intervention this week.  No angina.  Cont asa, statin.  No ? blocker in setting of sinus brady and soft bp's req midodrine.  Will eval schedule for potential staged PCI 12/31.  The patient understands that risks include but are not limited to stroke (1 in 1000), death (1 in 1000), kidney failure [usually temporary] (1 in 500), bleeding (1 in 200), allergic reaction [possibly serious] (1 in 200), and agrees to proceed.  Will need hydration pre-cath.  3.  HTN:  BP soft.  Home lisinopril on hold.  Currently on midodrine.  4.  HL:  Was on simva 20 @ home.  LDL 78.  Now on atorva 80.  Will need outpt f/u.  5.  Permanent Afib: rate controlled in absence of AVN blocking agent.  Eliquis on hold in setting of #1.  Cont heparin.  6.  CKD III:  Creat improved this AM @ 1.57.  Home dose of lisinopril on hold.  Plan hydration prior to staged PCI.  Signed, Nicolasa Ducking, NP  06/24/2023, 10:29 AM    For questions or updates, please contact   Please consult [email or web address] for contact info under Cardiology/STEMI.

## 2023-06-24 NOTE — Plan of Care (Signed)
Problem: Education: Goal: Knowledge of General Education information will improve Description: Including pain rating scale, medication(s)/side effects and non-pharmacologic comfort measures Outcome: Progressing   Problem: Health Behavior/Discharge Planning: Goal: Ability to manage health-related needs will improve Outcome: Progressing   Problem: Clinical Measurements: Goal: Ability to maintain clinical measurements within normal limits will improve Outcome: Progressing Goal: Will remain free from infection Outcome: Progressing Goal: Diagnostic test results will improve Outcome: Progressing Goal: Respiratory complications will improve Outcome: Progressing Goal: Cardiovascular complication will be avoided Outcome: Progressing   Problem: Activity: Goal: Risk for activity intolerance will decrease Outcome: Progressing   Problem: Nutrition: Goal: Adequate nutrition will be maintained Outcome: Progressing   Problem: Coping: Goal: Level of anxiety will decrease Outcome: Progressing   Problem: Elimination: Goal: Will not experience complications related to bowel motility Outcome: Progressing Goal: Will not experience complications related to urinary retention Outcome: Progressing   Problem: Pain Management: Goal: General experience of comfort will improve Outcome: Progressing   Problem: Safety: Goal: Ability to remain free from injury will improve Outcome: Progressing   Problem: Skin Integrity: Goal: Risk for impaired skin integrity will decrease Outcome: Progressing   Problem: Education: Goal: Understanding of CV disease, CV risk reduction, and recovery process will improve Outcome: Progressing Goal: Individualized Educational Video(s) Outcome: Progressing   Problem: Activity: Goal: Ability to return to baseline activity level will improve Outcome: Progressing   Problem: Cardiovascular: Goal: Ability to achieve and maintain adequate cardiovascular perfusion  will improve Outcome: Progressing Goal: Vascular access site(s) Level 0-1 will be maintained Outcome: Progressing   Problem: Health Behavior/Discharge Planning: Goal: Ability to safely manage health-related needs after discharge will improve Outcome: Progressing   Problem: Education: Goal: Ability to describe self-care measures that may prevent or decrease complications (Diabetes Survival Skills Education) will improve Outcome: Progressing Goal: Individualized Educational Video(s) Outcome: Progressing   Problem: Coping: Goal: Ability to adjust to condition or change in health will improve Outcome: Progressing   Problem: Fluid Volume: Goal: Ability to maintain a balanced intake and output will improve Outcome: Progressing   Problem: Health Behavior/Discharge Planning: Goal: Ability to identify and utilize available resources and services will improve Outcome: Progressing Goal: Ability to manage health-related needs will improve Outcome: Progressing   Problem: Metabolic: Goal: Ability to maintain appropriate glucose levels will improve Outcome: Progressing   Problem: Nutritional: Goal: Maintenance of adequate nutrition will improve Outcome: Progressing Goal: Progress toward achieving an optimal weight will improve Outcome: Progressing   Problem: Skin Integrity: Goal: Risk for impaired skin integrity will decrease Outcome: Progressing   Problem: Tissue Perfusion: Goal: Adequacy of tissue perfusion will improve Outcome: Progressing   Problem: Education: Goal: Understanding of cardiac disease, CV risk reduction, and recovery process will improve Outcome: Progressing Goal: Individualized Educational Video(s) Outcome: Progressing   Problem: Activity: Goal: Ability to tolerate increased activity will improve Outcome: Progressing   Problem: Cardiac: Goal: Ability to achieve and maintain adequate cardiovascular perfusion will improve Outcome: Progressing   Problem:  Health Behavior/Discharge Planning: Goal: Ability to safely manage health-related needs after discharge will improve Outcome: Progressing   Problem: Education: Goal: Understanding of CV disease, CV risk reduction, and recovery process will improve Outcome: Progressing Goal: Individualized Educational Video(s) Outcome: Progressing   Problem: Activity: Goal: Ability to return to baseline activity level will improve Outcome: Progressing   Problem: Cardiovascular: Goal: Ability to achieve and maintain adequate cardiovascular perfusion will improve Outcome: Progressing Goal: Vascular access site(s) Level 0-1 will be maintained Outcome: Progressing   Problem: Health Behavior/Discharge Planning: Goal:  Ability to safely manage health-related needs after discharge will improve Outcome: Progressing

## 2023-06-25 ENCOUNTER — Other Ambulatory Visit: Payer: Self-pay

## 2023-06-25 ENCOUNTER — Encounter
Admission: EM | Disposition: A | Discharge status: Home / Self Care | Payer: Self-pay | Source: Home / Self Care | Attending: Obstetrics and Gynecology

## 2023-06-25 DIAGNOSIS — I300 Acute nonspecific idiopathic pericarditis: Secondary | ICD-10-CM | POA: Diagnosis not present

## 2023-06-25 DIAGNOSIS — I25110 Atherosclerotic heart disease of native coronary artery with unstable angina pectoris: Secondary | ICD-10-CM | POA: Diagnosis not present

## 2023-06-25 DIAGNOSIS — I200 Unstable angina: Secondary | ICD-10-CM | POA: Diagnosis not present

## 2023-06-25 HISTORY — PX: LEFT HEART CATH AND CORONARY ANGIOGRAPHY: CATH118249

## 2023-06-25 HISTORY — PX: CORONARY STENT INTERVENTION: CATH118234

## 2023-06-25 LAB — CBC
HCT: 34 % — ABNORMAL LOW (ref 39.0–52.0)
Hemoglobin: 11.2 g/dL — ABNORMAL LOW (ref 13.0–17.0)
MCH: 30.5 pg (ref 26.0–34.0)
MCHC: 32.9 g/dL (ref 30.0–36.0)
MCV: 92.6 fL (ref 80.0–100.0)
Platelets: 211 10*3/uL (ref 150–400)
RBC: 3.67 MIL/uL — ABNORMAL LOW (ref 4.22–5.81)
RDW: 13.4 % (ref 11.5–15.5)
WBC: 6.1 10*3/uL (ref 4.0–10.5)
nRBC: 0 % (ref 0.0–0.2)

## 2023-06-25 LAB — BASIC METABOLIC PANEL
Anion gap: 7 (ref 5–15)
BUN: 29 mg/dL — ABNORMAL HIGH (ref 8–23)
CO2: 23 mmol/L (ref 22–32)
Calcium: 8.6 mg/dL — ABNORMAL LOW (ref 8.9–10.3)
Chloride: 109 mmol/L (ref 98–111)
Creatinine, Ser: 1.44 mg/dL — ABNORMAL HIGH (ref 0.61–1.24)
GFR, Estimated: 47 mL/min — ABNORMAL LOW (ref >60)
Glucose, Bld: 117 mg/dL — ABNORMAL HIGH (ref 70–99)
Potassium: 4.2 mmol/L (ref 3.5–5.1)
Sodium: 139 mmol/L (ref 135–145)

## 2023-06-25 LAB — APTT
aPTT: 64 s — ABNORMAL HIGH (ref 24–36)
aPTT: 99 s — ABNORMAL HIGH (ref 24–36)

## 2023-06-25 LAB — POCT ACTIVATED CLOTTING TIME: Activated Clotting Time: 273 s

## 2023-06-25 LAB — HEPARIN LEVEL (UNFRACTIONATED): Heparin Unfractionated: 0.39 [IU]/mL (ref 0.30–0.70)

## 2023-06-25 LAB — MAGNESIUM: Magnesium: 2.1 mg/dL (ref 1.7–2.4)

## 2023-06-25 SURGERY — LEFT HEART CATH AND CORONARY ANGIOGRAPHY
Anesthesia: Moderate Sedation

## 2023-06-25 MED ORDER — HEPARIN BOLUS VIA INFUSION
1350.0000 [IU] | Freq: Once | INTRAVENOUS | Status: AC
  Administered 2023-06-25: 1350 [IU] via INTRAVENOUS
  Filled 2023-06-25: qty 1350

## 2023-06-25 MED ORDER — LABETALOL HCL 5 MG/ML IV SOLN
10.0000 mg | INTRAVENOUS | Status: AC | PRN
Start: 2023-06-25 — End: 2023-06-25

## 2023-06-25 MED ORDER — LIDOCAINE HCL 1 % IJ SOLN
INTRAMUSCULAR | Status: DC | PRN
  Administered 2023-06-25: 1 mL

## 2023-06-25 MED ORDER — SODIUM CHLORIDE 0.9 % IV SOLN
INTRAVENOUS | Status: AC
Start: 2023-06-25 — End: 2023-06-25

## 2023-06-25 MED ORDER — NITROGLYCERIN 1 MG/10 ML FOR IR/CATH LAB
INTRA_ARTERIAL | Status: DC | PRN
  Administered 2023-06-25 (×2): 200 ug via INTRACORONARY

## 2023-06-25 MED ORDER — HEPARIN (PORCINE) IN NACL 1000-0.9 UT/500ML-% IV SOLN
INTRAVENOUS | Status: DC | PRN
  Administered 2023-06-25: 1000 mL

## 2023-06-25 MED ORDER — SODIUM CHLORIDE 0.9% FLUSH
3.0000 mL | Freq: Two times a day (BID) | INTRAVENOUS | Status: DC
  Administered 2023-06-26: 3 mL via INTRAVENOUS

## 2023-06-25 MED ORDER — IOHEXOL 300 MG/ML  SOLN
INTRAMUSCULAR | Status: DC | PRN
  Administered 2023-06-25: 52 mL

## 2023-06-25 MED ORDER — HYDRALAZINE HCL 20 MG/ML IJ SOLN: 10.0000 mg | INTRAMUSCULAR | Status: AC | PRN

## 2023-06-25 MED ORDER — FENTANYL CITRATE (PF) 100 MCG/2ML IJ SOLN
INTRAMUSCULAR | Status: AC
  Filled 2023-06-25: qty 2

## 2023-06-25 MED ORDER — HEPARIN SODIUM (PORCINE) 1000 UNIT/ML IJ SOLN
INTRAMUSCULAR | Status: AC
  Filled 2023-06-25: qty 10

## 2023-06-25 MED ORDER — MIDAZOLAM HCL 2 MG/2ML IJ SOLN
INTRAMUSCULAR | Status: AC
  Filled 2023-06-25: qty 2

## 2023-06-25 MED ORDER — MIDAZOLAM HCL 2 MG/2ML IJ SOLN
INTRAMUSCULAR | Status: DC | PRN
  Administered 2023-06-25: .5 mg via INTRAVENOUS

## 2023-06-25 MED ORDER — LIDOCAINE HCL 1 % IJ SOLN
INTRAMUSCULAR | Status: AC
  Filled 2023-06-25: qty 20

## 2023-06-25 MED ORDER — FENTANYL CITRATE (PF) 100 MCG/2ML IJ SOLN
INTRAMUSCULAR | Status: DC | PRN
  Administered 2023-06-25: 12.5 ug via INTRAVENOUS

## 2023-06-25 MED ORDER — VERAPAMIL HCL 2.5 MG/ML IV SOLN
INTRAVENOUS | Status: AC
  Filled 2023-06-25: qty 2

## 2023-06-25 MED ORDER — HEPARIN SODIUM (PORCINE) 1000 UNIT/ML IJ SOLN
INTRAMUSCULAR | Status: DC | PRN
  Administered 2023-06-25: 2000 [IU] via INTRAVENOUS
  Administered 2023-06-25: 10000 [IU] via INTRAVENOUS
  Administered 2023-06-25: 3000 [IU] via INTRAVENOUS

## 2023-06-25 MED ORDER — HEPARIN (PORCINE) 25000 UT/250ML-% IV SOLN
1300.0000 [IU]/h | INTRAVENOUS | Status: DC
  Administered 2023-06-25: 1300 [IU]/h via INTRAVENOUS
  Filled 2023-06-25: qty 250

## 2023-06-25 MED ORDER — SODIUM CHLORIDE 0.9 % IV SOLN
INTRAVENOUS | Status: DC | PRN
  Administered 2023-06-25: 250 mL via INTRAVENOUS

## 2023-06-25 MED ORDER — SODIUM CHLORIDE 0.9% FLUSH: 3.0000 mL | INTRAVENOUS | Status: DC | PRN

## 2023-06-25 MED ORDER — VERAPAMIL HCL 2.5 MG/ML IV SOLN
INTRAVENOUS | Status: DC | PRN
  Administered 2023-06-25 (×2): 2.5 mg via INTRA_ARTERIAL

## 2023-06-25 MED ORDER — SODIUM CHLORIDE 0.9 % IV SOLN: 250.0000 mL | INTRAVENOUS | Status: DC | PRN

## 2023-06-25 SURGICAL SUPPLY — 20 items
BALLN TREK RX 2.5X8 (BALLOONS) ×1
BALLN ~~LOC~~ TREK NEO RX 3.0X8 (BALLOONS) ×1
BALLOON TREK RX 2.5X8 (BALLOONS) IMPLANT
BALLOON ~~LOC~~ TREK NEO RX 3.0X8 (BALLOONS) IMPLANT
CATH INFINITI JR4 5F (CATHETERS) IMPLANT
CATH LAUNCHER 6FR EBU3.5 (CATHETERS) IMPLANT
DEVICE RAD COMP TR BAND LRG (VASCULAR PRODUCTS) IMPLANT
DRAPE BRACHIAL (DRAPES) IMPLANT
GLIDESHEATH SLEND SS 6F .021 (SHEATH) IMPLANT
GUIDEWIRE INQWIRE 1.5J.035X260 (WIRE) IMPLANT
GUIDEWIRE PRESS OMNI 185 ST (WIRE) IMPLANT
GUIDEWIRE PRESSURE X 175 (WIRE) IMPLANT
INQWIRE 1.5J .035X260CM (WIRE) ×1
KIT ENCORE 26 ADVANTAGE (KITS) IMPLANT
PACK CARDIAC CATH (CUSTOM PROCEDURE TRAY) ×1 IMPLANT
PROTECTION STATION PRESSURIZED (MISCELLANEOUS) ×1
SET ATX-X65L (MISCELLANEOUS) IMPLANT
STATION PROTECTION PRESSURIZED (MISCELLANEOUS) IMPLANT
STENT ONYX FRONTIER 2.75X12 (Permanent Stent) IMPLANT
WIRE RUNTHROUGH .014X180CM (WIRE) IMPLANT

## 2023-06-25 NOTE — Interval H&P Note (Signed)
 History and Physical Interval Note:  06/25/2023 2:48 PM  Jonathan [name]  has presented today for surgery, with the diagnosis of coronary artery disease.  The various methods of treatment have been discussed with the patient and family. After consideration of risks, benefits and other options for treatment, the patient has consented to  Procedure(s): LEFT HEART CATH AND CORONARY ANGIOGRAPHY (N/A) as a surgical intervention.  The patient's history has been reviewed, patient examined, no change in status, stable for surgery.  I have reviewed the patient's chart and labs.  Questions were answered to the patient's satisfaction.    Cath Lab Visit (complete for each Cath Lab visit)  Clinical Evaluation Leading to the Procedure:   ACS: Yes.    Non-ACS:    [name]

## 2023-06-25 NOTE — Progress Notes (Addendum)
 Cardiology Progress Note   Patient Name: Jonathan [name] Date of Encounter: 06/25/2023  Primary Cardiologist: Redell Cave, MD  Subjective   Minimal pleuritic c/p this AM.  In good spirits and overall feeling well.  NPO for cath today.  Questions answered. Objective   Inpatient Medications    Scheduled Meds:  [START ON 06/26/2023] aspirin  EC  81 mg Oral Daily   atorvastatin   80 mg Oral Daily   Chlorhexidine  Gluconate Cloth  6 each Topical Q0600   clopidogrel   75 mg Oral Daily   colchicine   0.6 mg Oral Daily   sodium chloride  flush  3-10 mL Intravenous Q12H   Continuous Infusions:  sodium chloride  1 mL/kg/hr (06/25/23 0600)   heparin  1,300 Units/hr (06/25/23 0600)   PRN Meds: acetaminophen , nitroGLYCERIN , ondansetron  (ZOFRAN ) IV, mouth rinse, oxyCODONE -acetaminophen , sodium chloride  flush   Vital Signs    Vitals:   06/25/23 0500 06/25/23 0600 06/25/23 0737 06/25/23 0800  BP: 123/82   130/64  Pulse: 68 63 61 63  Resp:  (!) 21 (!) 22 (!) 21  Temp:   97.8 F (36.6 C)   TempSrc:   Oral   SpO2: 97% 97% 98% 98%  Weight:      Height:        Intake/Output Summary (Last 24 hours) at 06/25/2023 0910 Last data filed at 06/25/2023 0809 Gross per 24 hour  Intake 1069.12 ml  Output 2125 ml  Net -1055.88 ml   Filed Weights   06/22/23 0852 06/22/23 1129  Weight: 96.2 kg 98.7 kg    Physical Exam   GEN: Well nourished, well developed, in no acute distress.  HEENT: Grossly normal.  Neck: Supple, no JVD, carotid bruits, or masses. Cardiac: IR, IR, no murmurs, rubs, or gallops. No clubbing, cyanosis, trace to 1+ bilat ankle edema.  Radials 2+, DP/PT 2+ and equal bilaterally.  R radial cath site ecchymotic w/o bleeding/bruit.  R femoral 2+ w/o bruits. Respiratory:  Respirations regular and unlabored, clear to auscultation bilaterally. GI: Soft, nontender, nondistended, BS + x 4. MS: no deformity or atrophy. Skin: warm and dry, no rash. Neuro:  Strength and sensation  are intact. Psych: AAOx3.  Normal affect.  Labs    Chemistry Recent Labs  Lab 06/23/23 0331 06/24/23 0353 06/25/23 0326  NA 137 134* 139  K 3.9 3.6 4.2  CL 109 104 109  CO2 21* 19* 23  GLUCOSE 149* 113* 117*  BUN 34* 35* 29*  CREATININE 1.76* 1.57* 1.44*  CALCIUM  7.6* 7.9* 8.6*  GFRNONAA 37* 42* 47*  ANIONGAP 7 11 7      Hematology Recent Labs  Lab 06/23/23 0331 06/24/23 0353 06/25/23 0326  WBC 7.8 6.1 6.1  RBC 3.36* 3.43* 3.67*  HGB 10.4* 10.6* 11.2*  HCT 31.9* 32.7* 34.0*  MCV 94.9 95.3 92.6  MCH 31.0 30.9 30.5  MCHC 32.6 32.4 32.9  RDW 13.7 13.8 13.4  PLT 174 169 211    Cardiac Enzymes  Recent Labs  Lab 06/22/23 0857 06/22/23 1143 06/22/23 1451  TROPONINIHS 11 13 15      Lipids  Lab Results  Component Value Date   CHOL 152 06/22/2023   HDL 63 06/22/2023   LDLCALC 78 06/22/2023   TRIG 56 06/22/2023   CHOLHDL 2.4 06/22/2023    HbA1c  Lab Results  Component Value Date   HGBA1C 6.0 (H) 06/22/2023    Radiology    US  Venous Img Lower Bilateral (DVT) Result Date: 06/22/2023 CLINICAL DATA:  Lower extremity edema EXAM: BILATERAL LOWER  EXTREMITY VENOUS DOPPLER ULTRASOUND TECHNIQUE: Gray-scale sonography with graded compression, as well as color Doppler and duplex ultrasound were performed to evaluate the lower extremity deep venous systems from the level of the common femoral vein and including the common femoral, femoral, profunda femoral, popliteal and calf veins including the posterior tibial, peroneal and gastrocnemius veins when visible. The superficial great saphenous vein was also interrogated. Spectral Doppler was utilized to evaluate flow at rest and with distal augmentation maneuvers in the common femoral, femoral and popliteal veins. COMPARISON:  None Available. FINDINGS: RIGHT LOWER EXTREMITY Common Femoral Vein: No evidence of thrombus. Normal compressibility, respiratory phasicity and response to augmentation. Saphenofemoral Junction: No  evidence of thrombus. Normal compressibility and flow on color Doppler imaging. Profunda Femoral Vein: No evidence of thrombus. Normal compressibility and flow on color Doppler imaging. Femoral Vein: No evidence of thrombus. Normal compressibility, respiratory phasicity and response to augmentation. Popliteal Vein: No evidence of thrombus. Normal compressibility, respiratory phasicity and response to augmentation. Calf Veins: No evidence of thrombus. Normal compressibility and flow on color Doppler imaging. Superficial Great Saphenous Vein: No evidence of thrombus. Normal compressibility. Venous Reflux:  None. Other Findings:  None. LEFT LOWER EXTREMITY Common Femoral Vein: No evidence of thrombus. Normal compressibility, respiratory phasicity and response to augmentation. Saphenofemoral Junction: No evidence of thrombus. Normal compressibility and flow on color Doppler imaging. Profunda Femoral Vein: No evidence of thrombus. Normal compressibility and flow on color Doppler imaging. Femoral Vein: No evidence of thrombus. Normal compressibility, respiratory phasicity and response to augmentation. Popliteal Vein: No evidence of thrombus. Normal compressibility, respiratory phasicity and response to augmentation. Calf Veins: No evidence of thrombus. Normal compressibility and flow on color Doppler imaging. Superficial Great Saphenous Vein: No evidence of thrombus. Normal compressibility. Venous Reflux:  None. Other Findings:  None. IMPRESSION: No evidence of deep venous thrombosis in either lower extremity. Electronically Signed   By: Wilkie Lent M.D.   On: 06/22/2023 15:20   DG Chest 2 View Result Date: 06/22/2023 CLINICAL DATA:  Chest pain. EXAM: CHEST - 2 VIEW COMPARISON:  None Available. FINDINGS: The heart size and mediastinal contours are within normal limits. Minimal bibasilar subsegmental atelectasis or scarring is noted. The visualized skeletal structures are unremarkable. IMPRESSION: Minimal  bibasilar subsegmental atelectasis or scarring. Electronically Signed   By: Lynwood Landy Raddle M.D.   On: 06/22/2023 09:13    Telemetry    Afib, 50's to 60's - Personally Reviewed  Cardiac Studies   Cardiac Catheterization  12.28.2024   Diagnostic Dominance: Right  1.  Two-vessel coronary artery disease with moderately severe stenosis of the proximal right coronary artery and severe stenosis of the proximal LAD 2.  Patency of the left main and left circumflex with no obstructive disease 3.  Normal LVEDP   Recommendations: Recommend IV fluid hydration, repeat renal function labs, hold Eliquis  and use IV heparin , check 2D echocardiogram to rule out pericardial effusion, check CRP and sed rate to evaluate for pericarditis, consider staged PCI on Monday if renal function stable and no pericardial effusion seen on echo.  While I think his coronary lesions are hemodynamically significant, it is not clear that he has a plaque rupture or culprit lesion.  Plan discussed with hospitalist team and patient's family over the telephone. _____________    2D Echocardiogram 12.29.2024   1. Left ventricular ejection fraction, by estimation, is 60 to 65%. The  left ventricle has normal function. The left ventricle has no regional  wall motion abnormalities. There is mild concentric  left ventricular  hypertrophy. Left ventricular diastolic  parameters were normal.   2. Right ventricular systolic function is normal. The right ventricular  size is normal. There is moderately elevated pulmonary artery systolic  pressure.   3. Left atrial size was severely dilated.   4. Right atrial size was mildly dilated.   5. The mitral valve is normal in structure. Mild mitral valve  regurgitation. No evidence of mitral stenosis.   6. The aortic valve is tricuspid. There is mild calcification of the  aortic valve. There is mild thickening of the aortic valve. Aortic valve  regurgitation is mild. No aortic stenosis is  present.   7. The inferior vena cava is dilated in size with <50% respiratory  variability, suggesting right atrial pressure of 15 mmHg.  _____________   Patient Profile     89 y.o. male w/ a h/o HTN, HL, persistent Afib, Stroke, and CKD III, who was admitted w/ c/p, ST elevation, found to have CAD w/o plaque rupture, nl hsTrops, and acute pericarditis.   Assessment & Plan    1.  Acute pericarditis:  Presented 12/28 w/ chest pain and borderline ECG changes.  Cath w/ CAD (see below) but symptoms more consistent w/ pericarditis.  ESR 44, CRP 15.8.  No effusion on echo.  Minimal pleuritic c/p this AM.  Cont colchicine .   2.  CAD:  Cath w/ severe LAD dzs and mod-sev RCA dzs.  No evidence of ruptured plaque and trops nl.  In setting of CKD III and eliquis  rx, no PCI performed 12/28.  For staged PCI of LAD +/- RCA today.  Cont asa, plavix , statin.  No ? blocker in setting of baseline bradycardia.  The patient understands that risks include but are not limited to stroke (1 in 1000), death (1 in 1000), kidney failure [usually temporary] (1 in 500), bleeding (1 in 200), allergic reaction [possibly serious] (1 in 200), and agrees to proceed.    3.  HTN:  Off midodrine .  BP stable.  Was on lisinopril  and hydrochlorothiazide  @ home - both currently held.  4.  HL:  On simva 20 @ home.  LDL 78  Now on lipitor  80.  Will need outpt f/u.  5.  Permanent Afib:  rate-controlled in absence of AVN blocking agent.  Eliquis  on hold.  Currently on heparin .  6.  CKD III:  Creat improved @ 1.44 this AM.  Cont pre-cath hydration.  Signed, Lonni Meager, NP  06/25/2023, 9:10 AM    For questions or updates, please contact   Please consult [email or web address] for contact info under Cardiology/STEMI.

## 2023-06-25 NOTE — Progress Notes (Signed)
  PROGRESS NOTE    Jonathan [name]  FMW:[id number] DOB: [birth year] DOA: 06/22/2023 PCP: Jonathan Cheryl BRAVO, MD  ARCL/NONE  LOS: 3 days   Brief hospital course:   Assessment & Plan: Jonathan [name] is a 89 y.o. male with medical history significant of CVA, atrial fibrillation, hypertension, hyperlipidemia, stage III CKD, history of prostate cancer, type 2 diabetes presenting with chest pain. Found to have pericarditis and obstructive CAD.    Chest pain 2/2 Pericarditis --Patient initially presented as STEMI.  Found to have pericarditis per cardio.  ESR and CRP both elevated.  His chest pain is improving.  --cont colchicine  as 0.6 mg BID   --hold NSAIDs since chest pain significantly improved.  # Obstructive CAD:  # Hyperlipidemia: --heart cath on 12/28 found to have 80% proximal LAD and 70% ostial RCA disease.  40% LCx.  LDL was 100 on 02/2023 and 78 this admission. --plan for staged PCI after Eliquis  washout, performed today, pci to LAD with DES, unable to address the RCA lesions --antiplatelets and anticoag per cardiology  Hypotension resolved  Permanent Atrial fibrillation (HCC) --rate controlled. --hold home Toprol  due to hypotension --heparin  currently, resume eliquis  per cardiology  CVA (cerebral vascular accident) (HCC) Recent evaluation 02/2023 for CVA w/ prior MRI demonstrating 9 mm acute ischemic infarct involving the superior right insula. --Increase atorvastatin  to 80 mg (ldl 78) --will likely d/c on plavix  substituted for aspirin   Chronic kidney disease, stage 3a (HCC) AKI ruled out Creatinine 1.44 with GFR in 40s Near baseline Monitor renal function with recent dye load  Prediabetes --recent A1c 6.0 --d/c BG checks and SSI  Bilateral Swelling of lower extremity Patient reports has been chronic issue.   No orthopnea at present.  US  neg for DVT. --compression stocking   DVT prophylaxis: On:heparin  gtt Code Status: Full code  Family Communication:  Level  of care: Progressive Dispo:   The patient is from: home Anticipated d/c is to: home Anticipated d/c date is: tomorrow     Subjective and Interval History:  No dyspnea, mild pain w/ inspiratino persists   Objective: Vitals:   06/25/23 1700 06/25/23 1715 06/25/23 1730 06/25/23 1800  BP: 131/72  136/65 (!) 144/67  Pulse: 64 66 61 (!) 59  Resp: 18 20 16 17   Temp:      TempSrc:      SpO2: 96% 96% 95% 96%  Weight:      Height:        Intake/Output Summary (Last 24 hours) at 06/25/2023 1815 Last data filed at 06/25/2023 1556 Gross per 24 hour  Intake 1573.11 ml  Output 2775 ml  Net -1201.89 ml   Filed Weights   06/22/23 0852 06/22/23 1129  Weight: 96.2 kg 98.7 kg    Examination:   Constitutional: NAD, AAOx3 HEENT: conjunctivae and lids normal, EOMI CV: No cyanosis.  Rrr, no murmur RESP: normal respiratory effort, on RA Extremities: edema in BLE, stable. Pressure bandage right wrist SKIN: warm, dry Neuro: moving all 4 Psych: Normal mood and affect.  Appropriate judgement and reason   Data Reviewed: I have personally reviewed labs and imaging studies   Jonathan KATHEE Ban, MD [facility] If 7PM-7AM, please contact night-coverage 06/25/2023, 6:15 PM

## 2023-06-25 NOTE — Progress Notes (Signed)
 PHARMACY - ANTICOAGULATION CONSULT NOTE  Pharmacy Consult for heparin  drip Indication:  NSTEMI  Allergies  Allergen Reactions   Penicillin V Potassium Other (See Comments)   Penicillins Rash    Patient Measurements: Height: 5' 9 (175.3 cm) Weight: 98.7 kg (217 lb 9.5 oz) IBW/kg (Calculated) : 70.7 Heparin  Dosing Weight: 90.7 kg  Vital Signs: Temp: 98.6 F (37 C) (12/31 0000) Temp Source: Oral (12/31 0000) BP: 116/71 (12/31 0300) Pulse Rate: 66 (12/31 0300)  Labs: Recent Labs    06/22/23 0857 06/22/23 0857 06/22/23 0940 06/22/23 1143 06/22/23 1451 06/23/23 0331 06/23/23 1139 06/24/23 0353 06/25/23 0326  HGB 11.6*  --   --   --   --  10.4*  --  10.6* 11.2*  HCT 36.1*  --   --   --   --  31.9*  --  32.7* 34.0*  PLT 192  --   --   --   --  174  --  169 211  APTT  --    < > 50*  --   --  66* 76* 77* 64*  LABPROT  --   --  21.0*  --   --   --   --   --   --   INR  --   --  1.8*  --   --   --   --   --   --   HEPARINUNFRC  --    < > >1.10*  --   --   --  >1.10* 0.70 0.39  CREATININE 1.56*  --   --   --   --  1.76*  --  1.57*  --   TROPONINIHS 11  --   --  13 15  --   --   --   --    < > = values in this interval not displayed.    Estimated Creatinine Clearance: 38.4 mL/min (A) (by C-G formula based on SCr of 1.57 mg/dL (H)).   Medical History: Past Medical History:  Diagnosis Date   Cancer (HCC)    prostate   Hypercholesteremia    Hypertension    Pre-diabetes    Stroke (HCC)     Medications:  Medications Prior to Admission  Medication Sig Dispense Refill Last Dose/Taking   apixaban  (ELIQUIS ) 5 MG TABS tablet Take 1 tablet (5 mg total) by mouth 2 (two) times daily. 120 tablet 3 Taking   carvedilol  (COREG ) 3.125 MG tablet Take 3.125 mg by mouth 2 (two) times daily.   Taking   lisinopril  (ZESTRIL ) 20 MG tablet Take 1 tablet (20 mg total) by mouth daily. 90 tablet 3 Taking   metoprolol  succinate (TOPROL  XL) 25 MG 24 hr tablet Take 1 tablet (25 mg total) by  mouth daily. 90 tablet 3 Taking   simvastatin  (ZOCOR ) 20 MG tablet Take 1 tablet (20 mg total) by mouth daily. 90 tablet 0 Taking   acetaminophen  (TYLENOL ) 500 MG tablet Take 1 tablet (500 mg total) by mouth every 6 (six) hours as needed. 30 tablet 0    Cholecalciferol (VITAMIN D ) 2000 units CAPS Take 1 capsule (2,000 Units total) by mouth daily. 30 capsule     Cyanocobalamin (B-12) 500 MCG TABS Take 1 tablet by mouth daily. 150 tablet     Triamcinolone Acetonide (NASACORT ALLERGY 24HR NA) Place into the nose.      Scheduled:   aspirin   81 mg Oral Pre-Cath   [START ON 06/26/2023] aspirin  EC  81 mg Oral  Daily   atorvastatin   80 mg Oral Daily   Chlorhexidine  Gluconate Cloth  6 each Topical Q0600   clopidogrel   75 mg Oral Daily   colchicine   0.6 mg Oral Daily   heparin   1,350 Units Intravenous Once   sodium chloride  flush  3-10 mL Intravenous Q12H   Infusions:   sodium chloride  3 mL/kg/hr (06/25/23 0339)   Followed by   sodium chloride  1 mL/kg/hr (06/25/23 0442)   heparin  1,100 Units/hr (06/25/23 0300)    Assessment: 89 yo M w/ chest pain/code STEMI to start heparin  drip.  Hx of Afib- on apixaban  PTA, prostate Ca, HLD, HTN, stroke, CKD Last dose of apixaban  5mg  12/28 am  ~0700-0800. Planning cardiac cath Baseline labs:  Hgb 11.6  Plt 192  aPTT 50  INR 1.8  HL >1.10  Goal of Therapy:  Heparin  level 0.3-0.7 units/ml aPTT 66-102 Monitor platelets by anticoagulation protocol: Yes  Plan: 12/31 @ 0326:  aPTT = 64,  HL = 0.39 - aPTT therapeutic,  HL SUBtherapeutic - Will order heparin  1350 units IV X 1 bolus and increase drip rate to 1300 units/hr - Will recheck aPTT 8 hrs after rate change - Will recheck HL on 01/01 with AM labs ---F/U heparin  level once daily to see when correlates with aPTT and can switch to monitoring HL ---Continue to monitor H&H and platelets  Luciann Gossett D 06/25/2023 4:46 AM

## 2023-06-25 NOTE — Progress Notes (Signed)
Patient transported off floor via bed by specials RN. Report given, chart sent with patient. Patient on RA. No distress noted, no c/o pain

## 2023-06-25 NOTE — Progress Notes (Signed)
Patient to be transferred to room 240b after cath. Report called to Alberteen Spindle, belongings taken to room and wife updated.

## 2023-06-25 NOTE — Progress Notes (Signed)
 PHARMACY - ANTICOAGULATION CONSULT NOTE  Pharmacy Consult for heparin  drip Indication:  NSTEMI  Allergies  Allergen Reactions   Penicillin V Potassium Other (See Comments)   Penicillins Rash   Patient Measurements: Height: 5' 9 (175.3 cm) Weight: 98.7 kg (217 lb 9.5 oz) IBW/kg (Calculated) : 70.7 Heparin  Dosing Weight: 90.7 kg  Vital Signs: Temp: 98 F (36.7 C) (12/31 1343) Temp Source: Oral (12/31 1343) BP: 125/75 (12/31 1900) Pulse Rate: 133 (12/31 1900)  Labs: Recent Labs    06/23/23 0331 06/23/23 1139 06/24/23 0353 06/25/23 0326 06/25/23 1244  HGB 10.4*  --  10.6* 11.2*  --   HCT 31.9*  --  32.7* 34.0*  --   PLT 174  --  169 211  --   APTT 66* 76* 77* 64* 99*  HEPARINUNFRC  --  >1.10* 0.70 0.39  --   CREATININE 1.76*  --  1.57* 1.44*  --    Estimated Creatinine Clearance: 41.9 mL/min (A) (by C-G formula based on SCr of 1.44 mg/dL (H)).  Medical History: Past Medical History:  Diagnosis Date   Cancer (HCC)    prostate   Hypercholesteremia    Hypertension    Pre-diabetes    Stroke [facility])    Medications:  Medications Prior to Admission  Medication Sig Dispense Refill Last Dose/Taking   acetaminophen  (TYLENOL ) 500 MG tablet Take 1 tablet (500 mg total) by mouth every 6 (six) hours as needed. 30 tablet 0 Taking As Needed   apixaban  (ELIQUIS ) 5 MG TABS tablet Take 1 tablet (5 mg total) by mouth 2 (two) times daily. 120 tablet 3 Past Week   carvedilol  (COREG ) 3.125 MG tablet Take 3.125 mg by mouth 2 (two) times daily.   Past Week   lisinopril  (ZESTRIL ) 20 MG tablet Take 1 tablet (20 mg total) by mouth daily. 90 tablet 3 Past Week   metoprolol  succinate (TOPROL  XL) 25 MG 24 hr tablet Take 1 tablet (25 mg total) by mouth daily. 90 tablet 3 Past Week   simvastatin  (ZOCOR ) 20 MG tablet Take 1 tablet (20 mg total) by mouth daily. 90 tablet 0 Past Week   Cholecalciferol (VITAMIN D ) 2000 units CAPS Take 1 capsule (2,000 Units total) by mouth daily. 30 capsule      Cyanocobalamin (B-12) 500 MCG TABS Take 1 tablet by mouth daily. 150 tablet     Triamcinolone Acetonide (NASACORT ALLERGY 24HR NA) Place into the nose.      Scheduled:   [MAR Hold] aspirin  EC  81 mg Oral Daily   [MAR Hold] atorvastatin   80 mg Oral Daily   [MAR Hold] Chlorhexidine  Gluconate Cloth  6 each Topical Q0600   [MAR Hold] clopidogrel   75 mg Oral Daily   [MAR Hold] colchicine   0.6 mg Oral Daily   sodium chloride  flush  3 mL Intravenous Q12H   [MAR Hold] sodium chloride  flush  3-10 mL Intravenous Q12H   Infusions:   sodium chloride      sodium chloride      Previous medications Last dose of apixaban  5 mg 12/28 @ ~0700 - 0800 Given heparin  during PCI procedure 12/31  Baseline labs: Hgb 11.6  Plt 192  aPTT 50  INR 1.8  HL >1.10  Assessment: SM is a 89 yo male who was admitted for chest pain and ST elevation initially concerning for STEMI. However, ESR/CRP were more suggestive of acute pericarditis. 12/31 underwent PCI. On clopidogrel  as well and will likely need to be started on apixaban  due to persistent atrial fibrillation.  Pharmacy has been consulted to dose and manage heparin  post PCI.   Per nurse, ok to start heparin  drip 2 hours after 1930. 1 hour post deflation at 1830.   Goal of Therapy:  Heparin  level 0.3-0.7 units/ml aPTT 66-102 Monitor platelets by anticoagulation protocol: Yes  Plan: Bolus not indicated at this time due to receiving heparin  in the cath lab Re-start heparin  gtt @ 1,300 units/hour Previously therapeutic x 3 at 1,100 units/hour was increased to 1300 units pre-procedure due to being subtherapeutic  Check HL in 8 hours Follow for transition to apixaban  post procedure  Monitor CBC and plt/hgb daily   Alfonso MARLA Buys, PharmD Pharmacy Resident  06/25/2023 7:52 PM

## 2023-06-25 NOTE — Progress Notes (Signed)
Right groin prepped in case site used for heart catheterization planned for 1330 today

## 2023-06-26 DIAGNOSIS — I25110 Atherosclerotic heart disease of native coronary artery with unstable angina pectoris: Secondary | ICD-10-CM | POA: Diagnosis not present

## 2023-06-26 DIAGNOSIS — Z955 Presence of coronary angioplasty implant and graft: Secondary | ICD-10-CM

## 2023-06-26 DIAGNOSIS — I2510 Atherosclerotic heart disease of native coronary artery without angina pectoris: Secondary | ICD-10-CM | POA: Insufficient documentation

## 2023-06-26 DIAGNOSIS — I300 Acute nonspecific idiopathic pericarditis: Secondary | ICD-10-CM | POA: Diagnosis not present

## 2023-06-26 LAB — BASIC METABOLIC PANEL
Anion gap: 9 (ref 5–15)
BUN: 20 mg/dL (ref 8–23)
CO2: 21 mmol/L — ABNORMAL LOW (ref 22–32)
Calcium: 8.5 mg/dL — ABNORMAL LOW (ref 8.9–10.3)
Chloride: 110 mmol/L (ref 98–111)
Creatinine, Ser: 1.38 mg/dL — ABNORMAL HIGH (ref 0.61–1.24)
GFR, Estimated: 49 mL/min — ABNORMAL LOW (ref >60)
Glucose, Bld: 105 mg/dL — ABNORMAL HIGH (ref 70–99)
Potassium: 3.9 mmol/L (ref 3.5–5.1)
Sodium: 140 mmol/L (ref 135–145)

## 2023-06-26 LAB — CBC
HCT: 34.3 % — ABNORMAL LOW (ref 39.0–52.0)
Hemoglobin: 11.4 g/dL — ABNORMAL LOW (ref 13.0–17.0)
MCH: 30.6 pg (ref 26.0–34.0)
MCHC: 33.2 g/dL (ref 30.0–36.0)
MCV: 92 fL (ref 80.0–100.0)
Platelets: 216 10*3/uL (ref 150–400)
RBC: 3.73 MIL/uL — ABNORMAL LOW (ref 4.22–5.81)
RDW: 13.2 % (ref 11.5–15.5)
WBC: 5.6 10*3/uL (ref 4.0–10.5)
nRBC: 0 % (ref 0.0–0.2)

## 2023-06-26 LAB — HEPARIN LEVEL (UNFRACTIONATED): Heparin Unfractionated: 0.44 [IU]/mL (ref 0.30–0.70)

## 2023-06-26 LAB — MAGNESIUM: Magnesium: 2 mg/dL (ref 1.7–2.4)

## 2023-06-26 LAB — APTT: aPTT: 84 s — ABNORMAL HIGH (ref 24–36)

## 2023-06-26 MED ORDER — METOPROLOL SUCCINATE ER 25 MG PO TB24: 25.0000 mg | ORAL_TABLET | Freq: Every day | ORAL | 3 refills | Status: AC

## 2023-06-26 MED ORDER — APIXABAN 5 MG PO TABS
5.0000 mg | ORAL_TABLET | Freq: Two times a day (BID) | ORAL | Status: DC
  Administered 2023-06-26: 5 mg via ORAL
  Filled 2023-06-26: qty 1

## 2023-06-26 MED ORDER — COLCHICINE 0.6 MG PO TABS: 0.6000 mg | ORAL_TABLET | Freq: Every day | ORAL | 0 refills | Status: AC

## 2023-06-26 MED ORDER — CLOPIDOGREL BISULFATE 75 MG PO TABS: 75.0000 mg | ORAL_TABLET | Freq: Every day | ORAL | 1 refills | Status: AC

## 2023-06-26 MED ORDER — ATORVASTATIN CALCIUM 80 MG PO TABS: 80.0000 mg | ORAL_TABLET | Freq: Every day | ORAL | 1 refills | Status: DC

## 2023-06-26 NOTE — Plan of Care (Signed)
 Problem: Education: Goal: Knowledge of General Education information will improve Description: Including pain rating scale, medication(s)/side effects and non-pharmacologic comfort measures Outcome: Progressing   Problem: Health Behavior/Discharge Planning: Goal: Ability to manage health-related needs will improve Outcome: Progressing   Problem: Clinical Measurements: Goal: Ability to maintain clinical measurements within normal limits will improve Outcome: Progressing Goal: Will remain free from infection Outcome: Progressing Goal: Diagnostic test results will improve Outcome: Progressing Goal: Respiratory complications will improve Outcome: Progressing Goal: Cardiovascular complication will be avoided Outcome: Progressing   Problem: Activity: Goal: Risk for activity intolerance will decrease Outcome: Progressing   Problem: Nutrition: Goal: Adequate nutrition will be maintained Outcome: Progressing   Problem: Coping: Goal: Level of anxiety will decrease Outcome: Progressing   Problem: Elimination: Goal: Will not experience complications related to bowel motility Outcome: Progressing Goal: Will not experience complications related to urinary retention Outcome: Progressing   Problem: Pain Management: Goal: General experience of comfort will improve Outcome: Progressing   Problem: Safety: Goal: Ability to remain free from injury will improve Outcome: Progressing   Problem: Skin Integrity: Goal: Risk for impaired skin integrity will decrease Outcome: Progressing   Problem: Education: Goal: Understanding of CV disease, CV risk reduction, and recovery process will improve Outcome: Progressing Goal: Individualized Educational Video(s) Outcome: Progressing   Problem: Activity: Goal: Ability to return to baseline activity level will improve Outcome: Progressing   Problem: Cardiovascular: Goal: Ability to achieve and maintain adequate cardiovascular perfusion  will improve Outcome: Progressing Goal: Vascular access site(s) Level 0-1 will be maintained Outcome: Progressing   Problem: Health Behavior/Discharge Planning: Goal: Ability to safely manage health-related needs after discharge will improve Outcome: Progressing   Problem: Education: Goal: Ability to describe self-care measures that may prevent or decrease complications (Diabetes Survival Skills Education) will improve Outcome: Progressing Goal: Individualized Educational Video(s) Outcome: Progressing   Problem: Coping: Goal: Ability to adjust to condition or change in health will improve Outcome: Progressing   Problem: Fluid Volume: Goal: Ability to maintain a balanced intake and output will improve Outcome: Progressing   Problem: Health Behavior/Discharge Planning: Goal: Ability to identify and utilize available resources and services will improve Outcome: Progressing Goal: Ability to manage health-related needs will improve Outcome: Progressing   Problem: Metabolic: Goal: Ability to maintain appropriate glucose levels will improve Outcome: Progressing   Problem: Nutritional: Goal: Maintenance of adequate nutrition will improve Outcome: Progressing Goal: Progress toward achieving an optimal weight will improve Outcome: Progressing   Problem: Skin Integrity: Goal: Risk for impaired skin integrity will decrease Outcome: Progressing   Problem: Tissue Perfusion: Goal: Adequacy of tissue perfusion will improve Outcome: Progressing   Problem: Education: Goal: Understanding of cardiac disease, CV risk reduction, and recovery process will improve Outcome: Progressing Goal: Individualized Educational Video(s) Outcome: Progressing   Problem: Activity: Goal: Ability to tolerate increased activity will improve Outcome: Progressing   Problem: Cardiac: Goal: Ability to achieve and maintain adequate cardiovascular perfusion will improve Outcome: Progressing   Problem:  Health Behavior/Discharge Planning: Goal: Ability to safely manage health-related needs after discharge will improve Outcome: Progressing   Problem: Education: Goal: Understanding of CV disease, CV risk reduction, and recovery process will improve Outcome: Progressing Goal: Individualized Educational Video(s) Outcome: Progressing   Problem: Activity: Goal: Ability to return to baseline activity level will improve Outcome: Progressing   Problem: Cardiovascular: Goal: Ability to achieve and maintain adequate cardiovascular perfusion will improve Outcome: Progressing Goal: Vascular access site(s) Level 0-1 will be maintained Outcome: Progressing   Problem: Health Behavior/Discharge Planning: Goal:  Ability to safely manage health-related needs after discharge will improve Outcome: Progressing

## 2023-06-26 NOTE — Progress Notes (Signed)
 Rounding Note    Patient Name: Jonathan [name] Date of Encounter: 06/26/2023  [facility] Cardiologist: Redell Cave, MD   Subjective   Patient denies chest pain. Possible d/c today. Kidney function stable. Cath site with mild swelling and bruising, but overall Ok. Needs to ambulate.  Inpatient Medications    Scheduled Meds:  aspirin  EC  81 mg Oral Daily   atorvastatin   80 mg Oral Daily   clopidogrel   75 mg Oral Daily   colchicine   0.6 mg Oral Daily   sodium chloride  flush  3 mL Intravenous Q12H   sodium chloride  flush  3-10 mL Intravenous Q12H   Continuous Infusions:  sodium chloride      heparin  1,300 Units/hr (06/26/23 0544)   PRN Meds: sodium chloride , acetaminophen , nitroGLYCERIN , ondansetron  (ZOFRAN ) IV, mouth rinse, oxyCODONE -acetaminophen , sodium chloride  flush, sodium chloride  flush   Vital Signs    Vitals:   06/25/23 2005 06/25/23 2311 06/26/23 0435 06/26/23 0730  BP: (!) 145/72 131/82 127/67 (!) 145/61  Pulse: 66 (!) 59 62 60  Resp: 16 18 19 16   Temp: 98.3 F (36.8 C) 98.6 F (37 C) 98.3 F (36.8 C) 97.7 F (36.5 C)  TempSrc:  Oral Oral Oral  SpO2: 99% 98% 97% 98%  Weight:      Height:        Intake/Output Summary (Last 24 hours) at 06/26/2023 0810 Last data filed at 06/26/2023 0545 Gross per 24 hour  Intake 953.43 ml  Output 2350 ml  Net -1396.57 ml      06/22/2023   11:29 AM 06/22/2023    8:52 AM 03/22/2023   10:11 AM  Last 3 Weights  Weight (lbs) 217 lb 9.5 oz 212 lb 221 lb  Weight (kg) 98.7 kg 96.163 kg 100.245 kg      Telemetry    Afib slow rates onvernight HR 50s - Personally Reviewed  ECG    No new - Personally Reviewed  Physical Exam   GEN: No acute distress.   Neck: No JVD Cardiac: Irreg IRreg, no murmurs, rubs, or gallops.  Respiratory: Clear to auscultation bilaterally. GI: Soft, nontender, non-distended  MS: No edema; No deformity. Neuro:  Nonfocal  Psych: Normal affect   Labs    High Sensitivity  Troponin:   Recent Labs  Lab 06/22/23 0857 06/22/23 1143 06/22/23 1451  TROPONINIHS 11 13 15      Chemistry Recent Labs  Lab 06/24/23 0353 06/25/23 0326 06/26/23 0534  NA 134* 139 140  K 3.6 4.2 3.9  CL 104 109 110  CO2 19* 23 21*  GLUCOSE 113* 117* 105*  BUN 35* 29* 20  CREATININE 1.57* 1.44* 1.38*  CALCIUM  7.9* 8.6* 8.5*  MG 2.0 2.1 2.0  GFRNONAA 42* 47* 49*  ANIONGAP 11 7 9     Lipids  Recent Labs  Lab 06/22/23 0940  CHOL 152  TRIG 56  HDL 63  LDLCALC 78  CHOLHDL 2.4    Hematology Recent Labs  Lab 06/24/23 0353 06/25/23 0326 06/26/23 0534  WBC 6.1 6.1 5.6  RBC 3.43* 3.67* 3.73*  HGB 10.6* 11.2* 11.4*  HCT 32.7* 34.0* 34.3*  MCV 95.3 92.6 92.0  MCH 30.9 30.5 30.6  MCHC 32.4 32.9 33.2  RDW 13.8 13.4 13.2  PLT 169 211 216   Thyroid  No results for input(s): TSH, FREET4 in the last 168 hours.  BNPNo results for input(s): BNP, PROBNP in the last 168 hours.  DDimer No results for input(s): DDIMER in the last 168 hours.  Radiology    CARDIAC CATHETERIZATION Result Date: 06/25/2023 Conclusions: Stable appearance of severe proximal LAD disease and moderate mid LCx plaque, similar to diagnostic angiogram over the weekend. Normal left ventricular filling pressure (LVEDP 10 mmHg). Successful PCI to 80% proximal LAD stenosis using Onyx Frontier 2.75 x 12 mm drug-eluting stent with 0% residual stenosis and TIMI-3 flow. Right coronary artery not visualized on today's procedure.  It was previously known to have 70% ostial stenosis.  Due to inability to perform hemodynamic assessment due to technical difficulties, further evaluation/intervention to this vessel was deferred. Recommendations: If no evidence of bleeding or vascular injury, transition from heparin  to apixaban  tomorrow morning.  Anticipate clopidogrel  and apixaban  for up to 12 months.  Aspirin  can be discontinued at discharge to minimize risk for bleeding. Continue medical therapy of moderate severe  ostial RCA disease.  If the patient has recurrent angina, consider stress test to assess functional significance. Aggressive secondary prevention of coronary artery disease. Continue colchicine  for treatment of acute pericarditis. Lonni Hanson, MD [facility]   Cardiac Studies   Stage PCI 12/31 Conclusions: Stable appearance of severe proximal LAD disease and moderate mid LCx plaque, similar to diagnostic angiogram over the weekend. Normal left ventricular filling pressure (LVEDP 10 mmHg). Successful PCI to 80% proximal LAD stenosis using Onyx Frontier 2.75 x 12 mm drug-eluting stent with 0% residual stenosis and TIMI-3 flow. Right coronary artery not visualized on today's procedure.  It was previously known to have 70% ostial stenosis.  Due to inability to perform hemodynamic assessment due to technical difficulties, further evaluation/intervention to this vessel was deferred.   Recommendations: If no evidence of bleeding or vascular injury, transition from heparin  to apixaban  tomorrow morning.  Anticipate clopidogrel  and apixaban  for up to 12 months.  Aspirin  can be discontinued at discharge to minimize risk for bleeding. Continue medical therapy of moderate severe ostial RCA disease.  If the patient has recurrent angina, consider stress test to assess functional significance. Aggressive secondary prevention of coronary artery disease. Continue colchicine  for treatment of acute pericarditis.   Lonni Hanson, MD [facility]   Coronary Diagrams  Diagnostic Dominance: Right  Intervention    LHC 12/28  1.  Two-vessel coronary artery disease with moderately severe stenosis of the proximal right coronary artery and severe stenosis of the proximal LAD 2.  Patency of the left main and left circumflex with no obstructive disease 3.  Normal LVEDP   Recommendations: Recommend IV fluid hydration, repeat renal function labs, hold Eliquis  and use IV heparin , check 2D echocardiogram  to rule out pericardial effusion, check CRP and sed rate to evaluate for pericarditis, consider staged PCI on Monday if renal function stable and no pericardial effusion seen on echo.  While I think his coronary lesions are hemodynamically significant, it is not clear that he has a plaque rupture or culprit lesion.  Plan discussed with hospitalist team and patient's family over the telephone.   Coronary Diagrams  Diagnostic Dominance: Right     Echo 06/23/23  1. Left ventricular ejection fraction, by estimation, is 60 to 65%. The  left ventricle has normal function. The left ventricle has no regional  wall motion abnormalities. There is mild concentric left ventricular  hypertrophy. Left ventricular diastolic  parameters were normal.   2. Right ventricular systolic function is normal. The right ventricular  size is normal. There is moderately elevated pulmonary artery systolic  pressure.   3. Left atrial size was severely dilated.   4. Right atrial size  was mildly dilated.   5. The mitral valve is normal in structure. Mild mitral valve  regurgitation. No evidence of mitral stenosis.   6. The aortic valve is tricuspid. There is mild calcification of the  aortic valve. There is mild thickening of the aortic valve. Aortic valve  regurgitation is mild. No aortic stenosis is present.   7. The inferior vena cava is dilated in size with <50% respiratory  variability, suggesting right atrial pressure of 15 mmHg.   Patient Profile     89 y.o. male w/ a h/o HTN, HL, persistent Afib, Stroke, and CKD III, who was admitted w/ c/p, ST elevation, found to have CAD w/o plaque rupture, nl hsTrops, and acute pericarditis.   Assessment & Plan    Acute pericarditis - presented 12/28 with chest pain and borderline EKG changes - caht showed CAD, but symptoms more consistent with pericarditis - ESR 44, CRP 15 - no effusion noted - continue colchicine   CAD - cath showed severe LAD dz and mod to  severe RCA dz.no evidence of ruptured plaque - HS trops wnl - s/p staged intervention 12/31 with PCI to LAD - continue Plavix , ASA, Eliquis  (can transition IV heparin  to Eliquis ). ASA can be discontinued at d/c - echo showed LVEF 60-65%, no WMA, mild LVH, diklated left atrium, mild calcification of aortic valve - continue Lipitor , no BB with bradycardia - ambulate patient today  HTN - BPS wnl - PTA lisinopril  held  HLD - continue Lipitor  80mg  daily - LDL 78  Permanent afib - rate controlled wihtout AVN blocking agent - continue ELiquis   For questions or updates, please contact  [facility] Please consult [email or web address] for contact info under        Signed, Caven Perine VEAR Fishman, PA-C  06/26/2023, 8:10 AM

## 2023-06-26 NOTE — Progress Notes (Signed)
 PHARMACY - ANTICOAGULATION CONSULT NOTE  Pharmacy Consult for heparin  drip Indication:  NSTEMI  Allergies  Allergen Reactions   Penicillin V Potassium Other (See Comments)   Penicillins Rash   Patient Measurements: Height: 5' 9 (175.3 cm) Weight: 98.7 kg (217 lb 9.5 oz) IBW/kg (Calculated) : 70.7 Heparin  Dosing Weight: 90.7 kg  Vital Signs: Temp: 98.3 F (36.8 C) (01/01 0435) Temp Source: Oral (01/01 0435) BP: 127/67 (01/01 0435) Pulse Rate: 62 (01/01 0435)  Labs: Recent Labs    06/24/23 0353 06/25/23 0326 06/25/23 1244 06/26/23 0534  HGB 10.6* 11.2*  --  11.4*  HCT 32.7* 34.0*  --  34.3*  PLT 169 211  --  216  APTT 77* 64* 99* 84*  HEPARINUNFRC 0.70 0.39  --  0.44  CREATININE 1.57* 1.44*  --  1.38*   Estimated Creatinine Clearance: 43.7 mL/min (A) (by C-G formula based on SCr of 1.38 mg/dL (H)).  Medical History: Past Medical History:  Diagnosis Date   Cancer (HCC)    prostate   Hypercholesteremia    Hypertension    Pre-diabetes    Stroke [facility])    Medications:  Medications Prior to Admission  Medication Sig Dispense Refill Last Dose/Taking   acetaminophen  (TYLENOL ) 500 MG tablet Take 1 tablet (500 mg total) by mouth every 6 (six) hours as needed. 30 tablet 0 Taking As Needed   apixaban  (ELIQUIS ) 5 MG TABS tablet Take 1 tablet (5 mg total) by mouth 2 (two) times daily. 120 tablet 3 Past Week   carvedilol  (COREG ) 3.125 MG tablet Take 3.125 mg by mouth 2 (two) times daily.   Past Week   lisinopril  (ZESTRIL ) 20 MG tablet Take 1 tablet (20 mg total) by mouth daily. 90 tablet 3 Past Week   metoprolol  succinate (TOPROL  XL) 25 MG 24 hr tablet Take 1 tablet (25 mg total) by mouth daily. 90 tablet 3 Past Week   simvastatin  (ZOCOR ) 20 MG tablet Take 1 tablet (20 mg total) by mouth daily. 90 tablet 0 Past Week   Cholecalciferol (VITAMIN D ) 2000 units CAPS Take 1 capsule (2,000 Units total) by mouth daily. 30 capsule     Cyanocobalamin (B-12) 500 MCG TABS Take 1 tablet  by mouth daily. 150 tablet     Triamcinolone Acetonide (NASACORT ALLERGY 24HR NA) Place into the nose.      Scheduled:   aspirin  EC  81 mg Oral Daily   atorvastatin   80 mg Oral Daily   clopidogrel   75 mg Oral Daily   colchicine   0.6 mg Oral Daily   sodium chloride  flush  3 mL Intravenous Q12H   sodium chloride  flush  3-10 mL Intravenous Q12H   Infusions:   sodium chloride      heparin  1,300 Units/hr (06/26/23 0544)   Previous medications Last dose of apixaban  5 mg 12/28 @ ~0700 - 0800 Given heparin  during PCI procedure 12/31  Baseline labs: Hgb 11.6  Plt 192  aPTT 50  INR 1.8  HL >1.10  Assessment: Jonathan [name] is a 89 yo male who was admitted for chest pain and ST elevation initially concerning for STEMI. However, ESR/CRP were more suggestive of acute pericarditis. 12/31 underwent PCI. On clopidogrel  as well and will likely need to be started on apixaban  due to persistent atrial fibrillation. Pharmacy has been consulted to dose and manage heparin  post PCI.   Per nurse, ok to start heparin  drip 2 hours after 1930. 1 hour post deflation at 1830.   Goal of Therapy:  Heparin  level 0.3-0.7  units/ml aPTT 66-102 Monitor platelets by anticoagulation protocol: Yes  Plan: 1/1 @ 0534:  aPTT = 84,  HL = 0.44 - aPTT therapeutic X 2,  now correlating with HL  - will use HL to guide dosing from here on - will continue pt on current rate and recheck HL on 1/2 with AM labs  Follow for transition to apixaban  post procedure  Monitor CBC and plt/hgb daily   Albion Weatherholtz D, PharmD 06/26/2023 6:54 AM

## 2023-06-26 NOTE — Discharge Summary (Signed)
 Jonathan [name] FMW:[id number] DOB: 1934-03-20 DOA: 06/22/2023  PCP: Jeffie Cheryl BRAVO, MD  Admit date: 06/22/2023 Discharge date: 06/26/2023  Time spent: 35 minutes  Recommendations for Outpatient Follow-up:  Pcp f/u Cardiology f/u     Discharge Diagnoses:  Principal Problem:   Acute idiopathic pericarditis Active Problems:   NSTEMI (non-ST elevated myocardial infarction) (HCC)   Benign hypertension   Hyperlipidemia   Prediabetes   Chronic kidney disease, stage 3a (HCC)   Unstable angina (HCC)   ACS (acute coronary syndrome) (HCC)   CVA (cerebral vascular accident) (HCC)   Atrial fibrillation (HCC)   Swelling of lower extremity   Hypotension due to hypovolemia   Coronary artery disease   S/P drug eluting coronary stent placement   Discharge Condition: stable  Diet recommendation: heart healthy  Filed Weights   06/22/23 0852 06/22/23 1129  Weight: 96.2 kg 98.7 kg    History of present illness:  From admission h and p Jonathan [name] is a 89 y.o. male with medical history significant of CVA, atrial fibrillation, hypertension, hyperlipidemia, stage III CKD, history of prostate cancer, type 2 diabetes presenting with NSTEMI and chest pain.  Patient reports going to the bathroom earlier today with moderate to severe central chest pain.  Patient attempted to go lay back down but symptoms persisted.  Pain central in nature with some radiation of the neck and down the left arm.  Pain worse with deep breathing as well as lying down.  Minimal nausea or diaphoresis.  No abdominal pain or diarrhea.  Noted admission September this year for new onset CVA as well as atrial fibrillation.  Has been compliant with home regimen including Eliquis  and beta-blocker.  Noted chronic lower extremity swelling.  No orthopnea or PND.   Hospital Course:  Patient presents with chest pain. Found to have acute pericarditis, treated with colchicine , much improved. Initial presentation concerning for ACS  so taken emergently to cath where two-vessel CAD disease was seen. Given ckd and apixaban  intervention was deferred. Taken two days later for staged PCI after apixaban  wash-out and IV hydration where DES to LAD was performed, technical difficulty accessing the right coronary so no intervention performed there. Ambulating without difficulty. Will d/c home to continue apixaban , add plavix  for minimum 6 months, and colchicine  ideally for 3 months. Cardiology f/u scheduled for 2 weeks, also referred for cardiac rehab. Other chronic conditions stable.   Procedures: Heat cath x2 as above   Consultations: cardiology  Discharge Exam: Vitals:   06/26/23 0730 06/26/23 1129  BP: (!) 145/61 (!) 144/78  Pulse: 60 (!) 55  Resp: 16 16  Temp: 97.7 F (36.5 C) 97.7 F (36.5 C)  SpO2: 98% 100%    General: nad Cardiovascular: rrr, soft systolic murmur Respiratory: ctab Ext: warm, no edema  Discharge Instructions   Discharge Instructions     AMB Referral to Cardiac Rehabilitation - Phase II   Complete by: As directed    Diagnosis: Coronary Stents   After initial evaluation and assessments completed: Virtual Based Care may be provided alone or in conjunction with Phase 2 Cardiac Rehab based on patient barriers.: Yes   Intensive Cardiac Rehabilitation (ICR) [facility] location only OR Traditional Cardiac Rehabilitation (TCR) *If criteria for ICR are not met will enroll in TCR ([facility] only): Yes   AMB referral to Phase II Cardiac Rehabilitation   Complete by: As directed    Diagnosis: Stable Angina   After initial evaluation and assessments completed: Virtual Based Care may be provided alone or  in conjunction with Phase 2 Cardiac Rehab based on patient barriers.: Yes   Intensive Cardiac Rehabilitation (ICR) [facility] location only OR Traditional Cardiac Rehabilitation (TCR) *If criteria for ICR are not met will enroll in TCR [facility] only): Yes   Diet - low sodium heart healthy   Complete by: As directed    Increase  activity slowly   Complete by: As directed       Allergies as of 06/26/2023       Reactions   Penicillin V Potassium Other (See Comments)   Penicillins Rash        Medication List     STOP taking these medications    carvedilol  3.125 MG tablet Commonly known as: COREG    simvastatin  20 MG tablet Commonly known as: ZOCOR        TAKE these medications    acetaminophen  500 MG tablet Commonly known as: TYLENOL  Take 1 tablet (500 mg total) by mouth every 6 (six) hours as needed.   apixaban  5 MG Tabs tablet Commonly known as: ELIQUIS  Take 1 tablet (5 mg total) by mouth 2 (two) times daily.   atorvastatin  80 MG tablet Commonly known as: LIPITOR  Take 1 tablet (80 mg total) by mouth daily. Start taking on: June 27, 2023   B-12 500 MCG Tabs Take 1 tablet by mouth daily.   clopidogrel  75 MG tablet Commonly known as: PLAVIX  Take 1 tablet (75 mg total) by mouth daily. Start taking on: June 27, 2023   colchicine  0.6 MG tablet Take 1 tablet (0.6 mg total) by mouth daily. Start taking on: June 27, 2023   lisinopril  20 MG tablet Commonly known as: ZESTRIL  Take 1 tablet (20 mg total) by mouth daily.   metoprolol  succinate 25 MG 24 hr tablet Commonly known as: Toprol  XL Take 1 tablet (25 mg total) by mouth daily.   NASACORT ALLERGY 24HR NA Place into the nose.   Vitamin D  50 MCG (2000 UT) Caps Take 1 capsule (2,000 Units total) by mouth daily.       Allergies  Allergen Reactions   Penicillin V Potassium Other (See Comments)   Penicillins Rash    Follow-up Information     Feldpausch, Cheryl BRAVO, MD Follow up.   Specialty: Family Medicine Contact information: [address] [place] [address] [phone]         Vivienne Lonni Ingle, NP Follow up.   Specialties: Cardiology, Radiology Contact information: [address] [place] [address] [phone]                  The results of significant diagnostics  from this hospitalization (including imaging, microbiology, ancillary and laboratory) are listed below for reference.    Significant Diagnostic Studies: CARDIAC CATHETERIZATION Result Date: 06/25/2023 Conclusions: Stable appearance of severe proximal LAD disease and moderate mid LCx plaque, similar to diagnostic angiogram over the weekend. Normal left ventricular filling pressure (LVEDP 10 mmHg). Successful PCI to 80% proximal LAD stenosis using Onyx Frontier 2.75 x 12 mm drug-eluting stent with 0% residual stenosis and TIMI-3 flow. Right coronary artery not visualized on today's procedure.  It was previously known to have 70% ostial stenosis.  Due to inability to perform hemodynamic assessment due to technical difficulties, further evaluation/intervention to this vessel was deferred. Recommendations: If no evidence of bleeding or vascular injury, transition from heparin  to apixaban  tomorrow morning.  Anticipate clopidogrel  and apixaban  for up to 12 months.  Aspirin  can be discontinued at discharge to minimize risk for  bleeding. Continue medical therapy of moderate severe ostial RCA disease.  If the patient has recurrent angina, consider stress test to assess functional significance. Aggressive secondary prevention of coronary artery disease. Continue colchicine  for treatment of acute pericarditis. Lonni Hanson, MD [facility]  ECHOCARDIOGRAM COMPLETE Result Date: 06/23/2023    ECHOCARDIOGRAM REPORT   Patient Name:   JUANCARLOS CRESCENZO Date of Exam: 06/23/2023 Medical Rec #:  [id number]      Height:       69.0 in Accession #:    [id number]     Weight:       217.6 lb Date of Birth:  [birth year]      BSA:          2.141 m Patient Age:    89 years       BP:           113/67 mmHg Patient Gender: M              HR:           59 bpm. Exam Location:  [facility] Procedure: 2D Echo, Cardiac Doppler and Color Doppler Indications:     Chest Pain R07.9  History:         Patient has prior history of Echocardiogram  examinations. Risk                  Factors:Hypertension.  Sonographer:     Bari Roar Referring Phys:  [id number] MICHAEL COOPER Diagnosing Phys: Annabella Scarce MD IMPRESSIONS  1. Left ventricular ejection fraction, by estimation, is 60 to 65%. The left ventricle has normal function. The left ventricle has no regional wall motion abnormalities. There is mild concentric left ventricular hypertrophy. Left ventricular diastolic parameters were normal.  2. Right ventricular systolic function is normal. The right ventricular size is normal. There is moderately elevated pulmonary artery systolic pressure.  3. Left atrial size was severely dilated.  4. Right atrial size was mildly dilated.  5. The mitral valve is normal in structure. Mild mitral valve regurgitation. No evidence of mitral stenosis.  6. The aortic valve is tricuspid. There is mild calcification of the aortic valve. There is mild thickening of the aortic valve. Aortic valve regurgitation is mild. No aortic stenosis is present.  7. The inferior vena cava is dilated in size with <50% respiratory variability, suggesting right atrial pressure of 15 mmHg. FINDINGS  Left Ventricle: Left ventricular ejection fraction, by estimation, is 60 to 65%. The left ventricle has normal function. The left ventricle has no regional wall motion abnormalities. The left ventricular internal cavity size was normal in size. There is  mild concentric left ventricular hypertrophy. Left ventricular diastolic parameters were normal. Indeterminate filling pressures. Right Ventricle: The right ventricular size is normal. No increase in right ventricular wall thickness. Right ventricular systolic function is normal. There is moderately elevated pulmonary artery systolic pressure. The tricuspid regurgitant velocity is 3.02 m/s, and with an assumed right atrial pressure of 15 mmHg, the estimated right ventricular systolic pressure is 51.5 mmHg. Left Atrium: Left atrial size was severely  dilated. Right Atrium: Right atrial size was mildly dilated. Pericardium: There is no evidence of pericardial effusion. Mitral Valve: The mitral valve is normal in structure. Mild mitral valve regurgitation. No evidence of mitral valve stenosis. MV peak gradient, 6.9 mmHg. The mean mitral valve gradient is 2.0 mmHg. Tricuspid Valve: The tricuspid valve is normal in structure. Tricuspid valve regurgitation is mild . No evidence of tricuspid stenosis. Aortic Valve: The aortic  valve is tricuspid. There is mild calcification of the aortic valve. There is mild thickening of the aortic valve. Aortic valve regurgitation is mild. No aortic stenosis is present. Aortic valve mean gradient measures 4.0 mmHg. Aortic valve peak gradient measures 10.0 mmHg. Aortic valve area, by VTI measures 2.77 cm. Pulmonic Valve: The pulmonic valve was normal in structure. Pulmonic valve regurgitation is mild. No evidence of pulmonic stenosis. Aorta: The aortic root is normal in size and structure. Venous: The inferior vena cava is dilated in size with less than 50% respiratory variability, suggesting right atrial pressure of 15 mmHg. IAS/Shunts: No atrial level shunt detected by color flow Doppler.  LEFT VENTRICLE PLAX 2D LVIDd:         5.10 cm   Diastology LVIDs:         3.70 cm   LV e' medial:    10.70 cm/s LV PW:         1.20 cm   LV E/e' medial:  11.0 LV IVS:        1.20 cm   LV e' lateral:   13.70 cm/s LVOT diam:     2.10 cm   LV E/e' lateral: 8.6 LV SV:         69 LV SV Index:   32 LVOT Area:     3.46 cm  RIGHT VENTRICLE RV Basal diam:  3.40 cm RV Mid diam:    3.10 cm RV S prime:     11.30 cm/s TAPSE (M-mode): 1.4 cm LEFT ATRIUM              Index        RIGHT ATRIUM           Index LA diam:        4.60 cm  2.15 cm/m   RA Area:     23.40 cm LA Vol (A2C):   102.0 ml 47.63 ml/m  RA Volume:   68.40 ml  31.94 ml/m LA Vol (A4C):   82.0 ml  38.29 ml/m LA Biplane Vol: 90.8 ml  42.40 ml/m  AORTIC VALVE                    PULMONIC VALVE  AV Area (Vmax):    2.37 cm     PV Vmax:          1.04 m/s AV Area (Vmean):   2.35 cm     PV Peak grad:     4.3 mmHg AV Area (VTI):     2.77 cm     PR End Diast Vel: 5.76 msec AV Vmax:           158.00 cm/s  RVOT Peak grad:   2 mmHg AV Vmean:          94.900 cm/s AV VTI:            0.250 m AV Peak Grad:      10.0 mmHg AV Mean Grad:      4.0 mmHg LVOT Vmax:         108.00 cm/s LVOT Vmean:        64.300 cm/s LVOT VTI:          0.200 m LVOT/AV VTI ratio: 0.80  AORTA Ao Root diam: 3.00 cm Ao Asc diam:  3.30 cm MITRAL VALVE                TRICUSPID VALVE MV Area (PHT): 3.33 cm     TR Peak grad:  36.5 mmHg MV Area VTI:   2.06 cm     TR Vmax:        302.00 cm/s MV Peak grad:  6.9 mmHg MV Mean grad:  2.0 mmHg     SHUNTS MV Vmax:       1.31 m/s     Systemic VTI:  0.20 m MV Vmean:      64.9 cm/s    Systemic Diam: 2.10 cm MV Decel Time: 228 msec MV E velocity: 118.00 cm/s Annabella Scarce MD Electronically signed by Annabella Scarce MD Signature Date/Time: 06/23/2023/3:41:20 PM    Final    US  Venous Img Lower Bilateral (DVT) Result Date: 06/22/2023 CLINICAL DATA:  Lower extremity edema EXAM: BILATERAL LOWER EXTREMITY VENOUS DOPPLER ULTRASOUND TECHNIQUE: Gray-scale sonography with graded compression, as well as color Doppler and duplex ultrasound were performed to evaluate the lower extremity deep venous systems from the level of the common femoral vein and including the common femoral, femoral, profunda femoral, popliteal and calf veins including the posterior tibial, peroneal and gastrocnemius veins when visible. The superficial great saphenous vein was also interrogated. Spectral Doppler was utilized to evaluate flow at rest and with distal augmentation maneuvers in the common femoral, femoral and popliteal veins. COMPARISON:  None Available. FINDINGS: RIGHT LOWER EXTREMITY Common Femoral Vein: No evidence of thrombus. Normal compressibility, respiratory phasicity and response to augmentation. Saphenofemoral  Junction: No evidence of thrombus. Normal compressibility and flow on color Doppler imaging. Profunda Femoral Vein: No evidence of thrombus. Normal compressibility and flow on color Doppler imaging. Femoral Vein: No evidence of thrombus. Normal compressibility, respiratory phasicity and response to augmentation. Popliteal Vein: No evidence of thrombus. Normal compressibility, respiratory phasicity and response to augmentation. Calf Veins: No evidence of thrombus. Normal compressibility and flow on color Doppler imaging. Superficial Great Saphenous Vein: No evidence of thrombus. Normal compressibility. Venous Reflux:  None. Other Findings:  None. LEFT LOWER EXTREMITY Common Femoral Vein: No evidence of thrombus. Normal compressibility, respiratory phasicity and response to augmentation. Saphenofemoral Junction: No evidence of thrombus. Normal compressibility and flow on color Doppler imaging. Profunda Femoral Vein: No evidence of thrombus. Normal compressibility and flow on color Doppler imaging. Femoral Vein: No evidence of thrombus. Normal compressibility, respiratory phasicity and response to augmentation. Popliteal Vein: No evidence of thrombus. Normal compressibility, respiratory phasicity and response to augmentation. Calf Veins: No evidence of thrombus. Normal compressibility and flow on color Doppler imaging. Superficial Great Saphenous Vein: No evidence of thrombus. Normal compressibility. Venous Reflux:  None. Other Findings:  None. IMPRESSION: No evidence of deep venous thrombosis in either lower extremity. Electronically Signed   By: Wilkie Lent M.D.   On: 06/22/2023 15:20   CARDIAC CATHETERIZATION Result Date: 06/22/2023 1.  Two-vessel coronary artery disease with moderately severe stenosis of the proximal right coronary artery and severe stenosis of the proximal LAD 2.  Patency of the left main and left circumflex with no obstructive disease 3.  Normal LVEDP Recommendations: Recommend IV fluid  hydration, repeat renal function labs, hold Eliquis  and use IV heparin , check 2D echocardiogram to rule out pericardial effusion, check CRP and sed rate to evaluate for pericarditis, consider staged PCI on Monday if renal function stable and no pericardial effusion seen on echo.  While I think his coronary lesions are hemodynamically significant, it is not clear that he has a plaque rupture or culprit lesion.  Plan discussed with hospitalist team and patient's family over the telephone.   DG Chest 2 View Result Date: 06/22/2023 CLINICAL DATA:  Chest pain. EXAM: CHEST - 2 VIEW COMPARISON:  None Available. FINDINGS: The heart size and mediastinal contours are within normal limits. Minimal bibasilar subsegmental atelectasis or scarring is noted. The visualized skeletal structures are unremarkable. IMPRESSION: Minimal bibasilar subsegmental atelectasis or scarring. Electronically Signed   By: Lynwood Landy Raddle M.D.   On: 06/22/2023 09:13    Microbiology: Recent Results (from the past 240 hours)  MRSA Next Gen by PCR, Nasal     Status: None   Collection Time: 06/22/23 11:31 AM   Specimen: Nasal Mucosa; Nasal Swab  Result Value Ref Range Status   MRSA by PCR Next Gen NOT DETECTED NOT DETECTED Final    Comment: (NOTE) The GeneXpert MRSA Assay (FDA approved for NASAL specimens only), is one component of a comprehensive MRSA colonization surveillance program. It is not intended to diagnose MRSA infection nor to guide or monitor treatment for MRSA infections. Test performance is not FDA approved in patients less than 67 years old. Performed at [facility], [address] [place], [place] [address]      Labs: Basic Metabolic Panel: Recent Labs  Lab 06/22/23 [phone] 06/23/23 0331 06/24/23 0353 06/25/23 0326 06/26/23 0534  NA 138 137 134* 139 140  K 4.2 3.9 3.6 4.2 3.9  CL 107 109 104 109 110  CO2 22 21* 19* 23 21*  GLUCOSE 120* 149* 113* 117* 105*  BUN 33* 34* 35* 29* 20   CREATININE 1.56* 1.76* 1.57* 1.44* 1.38*  CALCIUM  8.3* 7.6* 7.9* 8.6* 8.5*  MG  --   --  2.0 2.1 2.0   Liver Function Tests: No results for input(s): AST, ALT, ALKPHOS, BILITOT, PROT, ALBUMIN in the last 168 hours. No results for input(s): LIPASE, AMYLASE in the last 168 hours. No results for input(s): AMMONIA in the last 168 hours. CBC: Recent Labs  Lab 06/22/23 0857 06/23/23 0331 06/24/23 0353 06/25/23 0326 06/26/23 0534  WBC 8.8 7.8 6.1 6.1 5.6  HGB 11.6* 10.4* 10.6* 11.2* 11.4*  HCT 36.1* 31.9* 32.7* 34.0* 34.3*  MCV 95.8 94.9 95.3 92.6 92.0  PLT 192 174 169 211 216   Cardiac Enzymes: No results for input(s): CKTOTAL, CKMB, CKMBINDEX, TROPONINI in the last 168 hours. BNP: BNP (last 3 results) No results for input(s): BNP in the last 8760 hours.  ProBNP (last 3 results) No results for input(s): PROBNP in the last 8760 hours.  CBG: Recent Labs  Lab 06/22/23 1130 06/22/23 1650 06/23/23 0745 06/23/23 1132 06/23/23 1600  GLUCAP 102* 100* 115* 106* 97       Signed:  [name] MD.  [facility] 06/26/2023, 12:39 PM

## 2023-06-26 NOTE — TOC CM/SW Note (Signed)
 Transition of Care [facility]) - Inpatient Brief Assessment   Patient Details  Name: Jonathan [name] MRN: [id number] Date of Birth: [birth year]  Transition of Care [facility]) CM/SW Contact:    Corean ONEIDA Haddock, RN Phone Number: 06/26/2023, 12:55 PM   Clinical Narrative:   Transition of Care (TOC) Screening Note   Patient Details  Name: Jonathan [name] Date of Birth: 12/24/1933   Transition of Care [facility]) CM/SW Contact:    Corean ONEIDA Haddock, RN Phone Number: 06/26/2023, 12:55 PM    Transition of Care Department [facility]) has reviewed patient and no TOC needs have been identified at this time. If new patient transition needs arise, please place a TOC consult.    Transition of Care Asessment: Insurance and Status: Insurance coverage has been reviewed Patient has primary care physician: Yes     Prior/Current Home Services: No current home services Social Drivers of Health Review: SDOH reviewed no interventions necessary Readmission risk has been reviewed: Yes Transition of care needs: no transition of care needs at this time

## 2023-06-26 NOTE — Care Management Important Message (Signed)
 Important Message  Patient Details  Name: Jonathan [name] MRN: [id number] Date of Birth: Oct 08, 1933   Important Message Given:  Yes - Medicare IM     Ilir Mahrt W, CMA 06/26/2023, 10:45 AM

## 2023-06-27 ENCOUNTER — Other Ambulatory Visit (INDEPENDENT_AMBULATORY_CARE_PROVIDER_SITE_OTHER): Payer: Self-pay | Admitting: Vascular Surgery

## 2023-06-27 ENCOUNTER — Encounter: Payer: Self-pay | Admitting: Internal Medicine

## 2023-06-27 DIAGNOSIS — I83891 Varicose veins of right lower extremities with other complications: Secondary | ICD-10-CM

## 2023-06-27 LAB — POCT ACTIVATED CLOTTING TIME: Activated Clotting Time: 250 s

## 2023-07-02 ENCOUNTER — Ambulatory Visit (INDEPENDENT_AMBULATORY_CARE_PROVIDER_SITE_OTHER): Payer: PPO

## 2023-07-02 ENCOUNTER — Ambulatory Visit (INDEPENDENT_AMBULATORY_CARE_PROVIDER_SITE_OTHER): Payer: PPO | Admitting: Vascular Surgery

## 2023-07-02 ENCOUNTER — Encounter (INDEPENDENT_AMBULATORY_CARE_PROVIDER_SITE_OTHER): Payer: Self-pay | Admitting: Vascular Surgery

## 2023-07-02 VITALS — BP 153/75 | HR 59 | Resp 18 | Ht 69.0 in | Wt 213.2 lb

## 2023-07-02 DIAGNOSIS — I83891 Varicose veins of right lower extremities with other complications: Secondary | ICD-10-CM

## 2023-07-02 DIAGNOSIS — I6523 Occlusion and stenosis of bilateral carotid arteries: Secondary | ICD-10-CM

## 2023-07-02 DIAGNOSIS — E785 Hyperlipidemia, unspecified: Secondary | ICD-10-CM

## 2023-07-02 DIAGNOSIS — R7303 Prediabetes: Secondary | ICD-10-CM

## 2023-07-02 DIAGNOSIS — I10 Essential (primary) hypertension: Secondary | ICD-10-CM

## 2023-07-02 NOTE — Assessment & Plan Note (Signed)
 Carotid duplex today demonstrates stable 1 to 39% ICA stenosis bilaterally.  No role for intervention at this level.  He is on Plavix , Eliquis , and Lipitor .  This may be more for his cardiac disease and his carotid disease, but this would be (more than) adequate therapy for his carotid disease as well.  Follow-up in 1 year with carotid duplex.

## 2023-07-02 NOTE — Assessment & Plan Note (Signed)
 A reflux study was performed today demonstrating a long segment reflux in the right great saphenous vein throughout its course including the saphenofemoral junction.  The left great saphenous vein also has significant reflux throughout most of its course including the saphenofemoral junction.  His swelling is significantly better after his coronary intervention, but it does persist and is worse on the right leg than the left leg.  We discussed the venous disease in detail today.  We discussed the risks and benefits of laser ablation of the great saphenous vein.  We would do his right leg first as this is the more severely affected leg, and then the left leg can be done in the future.  He desires to proceed with laser ablation of the great saphenous veins.

## 2023-07-02 NOTE — Progress Notes (Signed)
 MRN : [id number]  Jonathan [name] is a 89 y.o. (03/22/1934) male who presents with chief complaint of  Chief Complaint  Patient presents with   Follow-up    3 month carotid + RLE reflux  .  History of Present Illness: Patient returns today in follow up of multiple vascular issues.  About a week ago, he underwent percutaneous coronary intervention for significant LAD disease.  This was done through a radial approach.  He has significant bruising and swelling in his right forearm, but he says this is improving.  Since that time, his leg swelling has gotten significantly better although some swelling still persist.  This is worse on the right than the left.  He has a known history of venous disease and had a previous ablation about 15 years ago.  Compression socks and elevation had not been doing a great job of controlling his swelling.  He has a prominent varicosity on the right medial knee and lower thigh area which is locally painful.  A reflux study was performed today demonstrating a long segment reflux in the right great saphenous vein throughout its course including the saphenofemoral junction.  The left great saphenous vein also has significant reflux throughout most of its course including the saphenofemoral junction. He is also followed for carotid disease.  He has not had any focal neurologic symptoms recently.  He has a remote history of stroke with no significant residual deficits. A carotid duplex today demonstrates 1 to 39% ICA stenosis bilaterally.  Current Outpatient Medications  Medication Sig Dispense Refill   acetaminophen  (TYLENOL ) 500 MG tablet Take 1 tablet (500 mg total) by mouth every 6 (six) hours as needed. 30 tablet 0   apixaban  (ELIQUIS ) 5 MG TABS tablet Take 1 tablet (5 mg total) by mouth 2 (two) times daily. 120 tablet 3   atorvastatin  (LIPITOR ) 80 MG tablet Take 1 tablet (80 mg total) by mouth daily. 90 tablet 1   Cholecalciferol (VITAMIN D ) 2000 units CAPS  Take 1 capsule (2,000 Units total) by mouth daily. 30 capsule    clopidogrel  (PLAVIX ) 75 MG tablet Take 1 tablet (75 mg total) by mouth daily. 90 tablet 1   colchicine  0.6 MG tablet Take 1 tablet (0.6 mg total) by mouth daily. 90 tablet 0   Cyanocobalamin (B-12) 500 MCG TABS Take 1 tablet by mouth daily. 150 tablet    lisinopril  (ZESTRIL ) 20 MG tablet Take 1 tablet (20 mg total) by mouth daily. 90 tablet 3   metoprolol  succinate (TOPROL  XL) 25 MG 24 hr tablet Take 1 tablet (25 mg total) by mouth daily. 90 tablet 3   Triamcinolone Acetonide (NASACORT ALLERGY 24HR NA) Place into the nose.     No current facility-administered medications for this visit.    Past Medical History:  Diagnosis Date   Cancer (HCC)    prostate   Hypercholesteremia    Hypertension    Pre-diabetes    Stroke [facility])     Past Surgical History:  Procedure Laterality Date   CORONARY STENT INTERVENTION N/A 06/25/2023   Procedure: CORONARY STENT INTERVENTION;  Surgeon: Mady Bruckner, MD;  Location: [facility] INVASIVE CV LAB;  Service: Cardiovascular;  Laterality: N/A;   CORONARY/GRAFT ACUTE MI REVASCULARIZATION N/A 06/22/2023   Procedure: Coronary/Graft Acute MI Revascularization;  Surgeon: Wonda Sharper, MD;  Location: [facility] INVASIVE CV LAB;  Service: Cardiovascular;  Laterality: N/A;   LEFT HEART CATH AND CORONARY ANGIOGRAPHY N/A 06/22/2023   Procedure: LEFT HEART CATH AND CORONARY ANGIOGRAPHY;  Surgeon: Wonda Sharper, MD;  Location: [facility] INVASIVE CV LAB;  Service: Cardiovascular;  Laterality: N/A;   LEFT HEART CATH AND CORONARY ANGIOGRAPHY N/A 06/25/2023   Procedure: LEFT HEART CATH AND CORONARY ANGIOGRAPHY;  Surgeon: Mady Bruckner, MD;  Location: [facility] INVASIVE CV LAB;  Service: Cardiovascular;  Laterality: N/A;   PROSTATE SURGERY     SKIN BIOPSY     basel cell     Social History   Tobacco Use   Smoking status: Former    Types: Cigars    Quit date: 1994    Years since quitting: 31.0   Smokeless  tobacco: Never  Vaping Use   Vaping status: Never Used  Substance Use Topics   Alcohol use: Yes    Comment: occasional/2 beers a month   Drug use: Never      Family History  Problem Relation Age of Onset   Heart disease Maternal Uncle    Heart disease Maternal Grandfather      Allergies  Allergen Reactions   Penicillin V Potassium Other (See Comments)   Penicillins Rash     REVIEW OF SYSTEMS (Negative unless checked)  Constitutional: [] Weight loss  [] Fever  [] Chills Cardiac: [] Chest pain   [] Chest pressure   [] Palpitations   [] Shortness of breath when laying flat   [] Shortness of breath at rest   [] Shortness of breath with exertion. Vascular:  [] Pain in legs with walking   [] Pain in legs at rest   [] Pain in legs when laying flat   [] Claudication   [] Pain in feet when walking  [] Pain in feet at rest  [] Pain in feet when laying flat   [] History of DVT   [] Phlebitis   [] Swelling in legs   [x] Varicose veins   [] Non-healing ulcers Pulmonary:   [] Uses home oxygen   [] Productive cough   [] Hemoptysis   [] Wheeze  [] COPD   [] Asthma Neurologic:  [] Dizziness  [] Blackouts   [] Seizures   [x] History of stroke   [] History of TIA  [] Aphasia   [] Temporary blindness   [] Dysphagia   [] Weakness or numbness in arms   [] Weakness or numbness in legs Musculoskeletal:  [x] Arthritis   [] Joint swelling   [x] Joint pain   [] Low back pain Hematologic:  [] Easy bruising  [] Easy bleeding   [] Hypercoagulable state   [] Anemic   Gastrointestinal:  [] Blood in stool   [] Vomiting blood  [] Gastroesophageal reflux/heartburn   [] Abdominal pain Genitourinary:  [] Chronic kidney disease   [] Difficult urination  [] Frequent urination  [] Burning with urination   [] Hematuria Skin:  [] Rashes   [] Ulcers   [] Wounds Psychological:  [] History of anxiety   []  History of major depression.  Physical Examination  BP (!) 153/75   Pulse (!) 59   Resp 18   Ht 5' 9 (1.753 m)   Wt 213 lb 3.2 oz (96.7 kg)   BMI 31.48 kg/m  Gen:   WD/WN, NAD. Appears younger than stated age. Head: [place]/AT, No temporalis wasting. Ear/Nose/Throat: Hearing grossly intact, nares w/o erythema or drainage Eyes: Conjunctiva clear. Sclera non-icteric Neck: Supple.  Trachea midline Pulmonary:  Good air movement, no use of accessory muscles.  Cardiac: Bradycardic Vascular:  Vessel Right Left  Radial Palpable Palpable       Musculoskeletal: M/S 5/5 throughout.  No deformity or atrophy.  Prominent varicosities worse on the right than the left.  These measure up to 2 to 3 mm in diameter.  1+ bilateral lower extremity edema. Neurologic: Sensation grossly intact in extremities.  Symmetrical.  Speech is fluent.  Psychiatric: Judgment intact, Mood & affect appropriate for pt's clinical situation. Dermatologic: No rashes or ulcers noted.  No cellulitis or open wounds.      Labs Recent Results (from the past 2160 hours)  Basic metabolic panel     Status: Abnormal   Collection Time: 06/22/23  8:57 AM  Result Value Ref Range   Sodium 138 135 - 145 mmol/L   Potassium 4.2 3.5 - 5.1 mmol/L   Chloride 107 98 - 111 mmol/L   CO2 22 22 - 32 mmol/L   Glucose, Bld 120 (H) 70 - 99 mg/dL    Comment: Glucose reference range applies only to samples taken after fasting for at least 8 hours.   BUN 33 (H) 8 - 23 mg/dL   Creatinine, Ser 8.43 (H) 0.61 - 1.24 mg/dL   Calcium  8.3 (L) 8.9 - 10.3 mg/dL   GFR, Estimated 43 (L) >60 mL/min    Comment: (NOTE) Calculated using the CKD-EPI Creatinine Equation (2021)    Anion gap 9 5 - 15    Comment: Performed at [facility], [address] [place], [place] [address]  CBC     Status: Abnormal   Collection Time: 06/22/23  8:57 AM  Result Value Ref Range   WBC 8.8 4.0 - 10.5 K/uL   RBC 3.77 (L) 4.22 - 5.81 MIL/uL   Hemoglobin 11.6 (L) 13.0 - 17.0 g/dL   HCT 63.8 (L) 60.9 - 47.9 %   MCV 95.8 80.0 - 100.0 fL   MCH 30.8 26.0 - 34.0 pg   MCHC 32.1 30.0 - 36.0 g/dL   RDW 86.1 88.4 - 84.4 %   Platelets  192 150 - 400 K/uL   nRBC 0.0 0.0 - 0.2 %    Comment: Performed at [facility], [address], [place], [place] [address]  Troponin I (High Sensitivity)     Status: None   Collection Time: 06/22/23  8:57 AM  Result Value Ref Range   Troponin I (High Sensitivity) 11 <18 ng/L    Comment: (NOTE) Elevated high sensitivity troponin I (hsTnI) values and significant  changes across serial measurements may suggest ACS but many other  chronic and acute conditions are known to elevate hsTnI results.  Refer to the Links section for chest pain algorithms and additional  guidance. Performed at [facility], [address] [place], [place] [address]   Hemoglobin A1c     Status: Abnormal   Collection Time: 06/22/23  9:40 AM  Result Value Ref Range   Hgb A1c MFr Bld 6.0 (H) 4.8 - 5.6 %    Comment: (NOTE)         Prediabetes: 5.7 - 6.4         Diabetes: >6.4         Glycemic control for adults with diabetes: <7.0    Mean Plasma Glucose 126 mg/dL    Comment: (NOTE) Performed At: [facility] [address] [place], [place] [id number] Jennette Shorter MD Ey:[id number]   Protime-INR     Status: Abnormal   Collection Time: 06/22/23  9:40 AM  Result Value Ref Range   Prothrombin Time 21.0 (H) 11.4 - 15.2 seconds   INR 1.8 (H) 0.8 - 1.2    Comment: (NOTE) INR goal varies based on device and disease states. Performed at [facility], [address] [place], [place] [address]   APTT     Status: Abnormal   Collection Time: 06/22/23  9:40 AM  Result Value  Ref Range   aPTT 50 (H) 24 - 36 seconds    Comment:        IF BASELINE aPTT IS ELEVATED, SUGGEST PATIENT RISK ASSESSMENT BE USED TO DETERMINE APPROPRIATE ANTICOAGULANT THERAPY. Performed at [facility], [address] [place], [place] [address]   Lipid panel     Status: None   Collection Time: 06/22/23  9:40 AM  Result Value Ref Range   Cholesterol 152 0 - 200 mg/dL   Triglycerides  56 <849 mg/dL   HDL 63 >59 mg/dL   Total CHOL/HDL Ratio 2.4 RATIO   VLDL 11 0 - 40 mg/dL   LDL Cholesterol 78 0 - 99 mg/dL    Comment:        Total Cholesterol/HDL:CHD Risk Coronary Heart Disease Risk Table                     Men   Women  1/2 Average Risk   3.4   3.3  Average Risk       5.0   4.4  2 X Average Risk   9.6   7.1  3 X Average Risk  23.4   11.0        Use the calculated Patient Ratio above and the CHD Risk Table to determine the patient's CHD Risk.        ATP III CLASSIFICATION (LDL):  <100     mg/dL   Optimal  899-870  mg/dL   Near or Above                    Optimal  130-159  mg/dL   Borderline  839-810  mg/dL   High  >809     mg/dL   Very High Performed at [facility], [address] [place], [place] [address]   Heparin  level (unfractionated)     Status: Abnormal   Collection Time: 06/22/23  9:40 AM  Result Value Ref Range   Heparin  Unfractionated >1.10 (H) 0.30 - 0.70 IU/mL    Comment: (NOTE) The clinical reportable range upper limit is being lowered to >1.10 to align with the FDA approved guidance for the current laboratory assay.  If heparin  results are below expected values, and patient dosage has  been confirmed, suggest follow up testing of antithrombin III levels. Performed at [facility], [address] [place], [place] [address]   Glucose, capillary     Status: Abnormal   Collection Time: 06/22/23 11:30 AM  Result Value Ref Range   Glucose-Capillary 102 (H) 70 - 99 mg/dL    Comment: Glucose reference range applies only to samples taken after fasting for at least 8 hours.  MRSA Next Gen by PCR, Nasal     Status: None   Collection Time: 06/22/23 11:31 AM   Specimen: Nasal Mucosa; Nasal Swab  Result Value Ref Range   MRSA by PCR Next Gen NOT DETECTED NOT DETECTED    Comment: (NOTE) The GeneXpert MRSA Assay (FDA approved for NASAL specimens only), is one component of a comprehensive MRSA colonization  surveillance program. It is not intended to diagnose MRSA infection nor to guide or monitor treatment for MRSA infections. Test performance is not FDA approved in patients less than 82 years old. Performed at [facility], [address] [place], [place] [address]   Troponin I (High Sensitivity)     Status: None   Collection Time: 06/22/23 11:43 AM  Result Value Ref Range  Troponin I (High Sensitivity) 13 <18 ng/L    Comment: (NOTE) Elevated high sensitivity troponin I (hsTnI) values and significant  changes across serial measurements may suggest ACS but many other  chronic and acute conditions are known to elevate hsTnI results.  Refer to the Links section for chest pain algorithms and additional  guidance. Performed at [facility], [address] [place], [place] [address]   Troponin I (High Sensitivity)     Status: None   Collection Time: 06/22/23  2:51 PM  Result Value Ref Range   Troponin I (High Sensitivity) 15 <18 ng/L    Comment: (NOTE) Elevated high sensitivity troponin I (hsTnI) values and significant  changes across serial measurements may suggest ACS but many other  chronic and acute conditions are known to elevate hsTnI results.  Refer to the Links section for chest pain algorithms and additional  guidance. Performed at [facility], [address] [place], [place] [address]   Glucose, capillary     Status: Abnormal   Collection Time: 06/22/23  4:50 PM  Result Value Ref Range   Glucose-Capillary 100 (H) 70 - 99 mg/dL    Comment: Glucose reference range applies only to samples taken after fasting for at least 8 hours.  CBC     Status: Abnormal   Collection Time: 06/23/23  3:31 AM  Result Value Ref Range   WBC 7.8 4.0 - 10.5 K/uL   RBC 3.36 (L) 4.22 - 5.81 MIL/uL   Hemoglobin 10.4 (L) 13.0 - 17.0 g/dL   HCT 68.0 (L) 60.9 - 47.9 %   MCV 94.9 80.0 - 100.0 fL   MCH 31.0 26.0 - 34.0 pg   MCHC 32.6 30.0 - 36.0 g/dL   RDW  86.2 88.4 - 84.4 %   Platelets 174 150 - 400 K/uL   nRBC 0.0 0.0 - 0.2 %    Comment: Performed at [facility], [address], [place], [place] [address]  Basic metabolic panel     Status: Abnormal   Collection Time: 06/23/23  3:31 AM  Result Value Ref Range   Sodium 137 135 - 145 mmol/L   Potassium 3.9 3.5 - 5.1 mmol/L   Chloride 109 98 - 111 mmol/L   CO2 21 (L) 22 - 32 mmol/L   Glucose, Bld 149 (H) 70 - 99 mg/dL    Comment: Glucose reference range applies only to samples taken after fasting for at least 8 hours.   BUN 34 (H) 8 - 23 mg/dL   Creatinine, Ser 8.23 (H) 0.61 - 1.24 mg/dL   Calcium  7.6 (L) 8.9 - 10.3 mg/dL   GFR, Estimated 37 (L) >60 mL/min    Comment: (NOTE) Calculated using the CKD-EPI Creatinine Equation (2021)    Anion gap 7 5 - 15    Comment: Performed at [facility], [address] [place], [place] [address]  Sedimentation rate     Status: Abnormal   Collection Time: 06/23/23  3:31 AM  Result Value Ref Range   Sed Rate 44 (H) 0 - 20 mm/hr    Comment: Performed at [facility], [address] [place], [place] [address]  C-reactive protein     Status: Abnormal   Collection Time: 06/23/23  3:31 AM  Result Value Ref Range   CRP 15.8 (H) <1.0 mg/dL    Comment: Performed at [facility], [address] [place], [place] [address]  Lipoprotein A (LPA)     Status: Abnormal  Collection Time: 06/23/23  3:31 AM  Result Value Ref Range   Lipoprotein (a) 126.8 (H) <75.0 nmol/L    Comment: (NOTE) Note:  Values greater than or equal to 75.0 nmol/L may       indicate an independent risk factor for CHD,       but must be evaluated with caution when applied       to non-Caucasian populations due to the       influence of genetic factors on Lp(a) across       ethnicities. Performed At: [facility] [address] [place], [place] [id number] Jennette Shorter MD Ey:[id number]   APTT     Status: Abnormal   Collection Time:  06/23/23  3:31 AM  Result Value Ref Range   aPTT 66 (H) 24 - 36 seconds    Comment:        IF BASELINE aPTT IS ELEVATED, SUGGEST PATIENT RISK ASSESSMENT BE USED TO DETERMINE APPROPRIATE ANTICOAGULANT THERAPY. Performed at [facility], [address] [place], [place] [address]   Glucose, capillary     Status: Abnormal   Collection Time: 06/23/23  7:45 AM  Result Value Ref Range   Glucose-Capillary 115 (H) 70 - 99 mg/dL    Comment: Glucose reference range applies only to samples taken after fasting for at least 8 hours.  Glucose, capillary     Status: Abnormal   Collection Time: 06/23/23 11:32 AM  Result Value Ref Range   Glucose-Capillary 106 (H) 70 - 99 mg/dL    Comment: Glucose reference range applies only to samples taken after fasting for at least 8 hours.  APTT     Status: Abnormal   Collection Time: 06/23/23 11:39 AM  Result Value Ref Range   aPTT 76 (H) 24 - 36 seconds    Comment:        IF BASELINE aPTT IS ELEVATED, SUGGEST PATIENT RISK ASSESSMENT BE USED TO DETERMINE APPROPRIATE ANTICOAGULANT THERAPY. Performed at [facility], [address] [place], [place] [address]   Heparin  level (unfractionated)     Status: Abnormal   Collection Time: 06/23/23 11:39 AM  Result Value Ref Range   Heparin  Unfractionated >1.10 (H) 0.30 - 0.70 IU/mL    Comment: (NOTE) The clinical reportable range upper limit is being lowered to >1.10 to align with the FDA approved guidance for the current laboratory assay.  If heparin  results are below expected values, and patient dosage has  been confirmed, suggest follow up testing of antithrombin III levels. Performed at [facility], [address] [place], [place] [address]   ECHOCARDIOGRAM COMPLETE     Status: None   Collection Time: 06/23/23  2:15 PM  Result Value Ref Range   Weight 3,481.5 oz   Height 69 in   BP 118/75 mmHg   Ao pk vel 1.58 m/s   AV Area VTI 2.77 cm2   AR max vel 2.37 cm2   AV  Mean grad 4.0 mmHg   AV Peak grad 10.0 mmHg   S' Lateral 3.70 cm   AV Area mean vel 2.35 cm2   Area-P 1/2 3.33 cm2   MV VTI 2.06 cm2   Est EF 60 - 65%   Glucose, capillary     Status: None   Collection Time: 06/23/23  4:00 PM  Result Value Ref Range   Glucose-Capillary 97 70 - 99 mg/dL    Comment: Glucose reference range applies only to samples taken after fasting for at least 8  hours.  APTT     Status: Abnormal   Collection Time: 06/24/23  3:53 AM  Result Value Ref Range   aPTT 77 (H) 24 - 36 seconds    Comment:        IF BASELINE aPTT IS ELEVATED, SUGGEST PATIENT RISK ASSESSMENT BE USED TO DETERMINE APPROPRIATE ANTICOAGULANT THERAPY. Performed at [facility], [address] [place], [place] [address]   Heparin  level (unfractionated)     Status: None   Collection Time: 06/24/23  3:53 AM  Result Value Ref Range   Heparin  Unfractionated 0.70 0.30 - 0.70 IU/mL    Comment: (NOTE) The clinical reportable range upper limit is being lowered to >1.10 to align with the FDA approved guidance for the current laboratory assay.  If heparin  results are below expected values, and patient dosage has  been confirmed, suggest follow up testing of antithrombin III levels. Performed at [facility], [address] [place], [place] [address]   CBC     Status: Abnormal   Collection Time: 06/24/23  3:53 AM  Result Value Ref Range   WBC 6.1 4.0 - 10.5 K/uL   RBC 3.43 (L) 4.22 - 5.81 MIL/uL   Hemoglobin 10.6 (L) 13.0 - 17.0 g/dL   HCT 67.2 (L) 60.9 - 47.9 %   MCV 95.3 80.0 - 100.0 fL   MCH 30.9 26.0 - 34.0 pg   MCHC 32.4 30.0 - 36.0 g/dL   RDW 86.1 88.4 - 84.4 %   Platelets 169 150 - 400 K/uL   nRBC 0.0 0.0 - 0.2 %    Comment: Performed at [facility], [address] [place], [place] [address]  Basic metabolic panel     Status: Abnormal   Collection Time: 06/24/23  3:53 AM  Result Value Ref Range   Sodium 134 (L) 135 - 145 mmol/L   Potassium 3.6 3.5 -  5.1 mmol/L   Chloride 104 98 - 111 mmol/L   CO2 19 (L) 22 - 32 mmol/L   Glucose, Bld 113 (H) 70 - 99 mg/dL    Comment: Glucose reference range applies only to samples taken after fasting for at least 8 hours.   BUN 35 (H) 8 - 23 mg/dL   Creatinine, Ser 8.42 (H) 0.61 - 1.24 mg/dL   Calcium  7.9 (L) 8.9 - 10.3 mg/dL   GFR, Estimated 42 (L) >60 mL/min    Comment: (NOTE) Calculated using the CKD-EPI Creatinine Equation (2021)    Anion gap 11 5 - 15    Comment: Performed at [facility], [address] [place], [place] [address]  Magnesium     Status: None   Collection Time: 06/24/23  3:53 AM  Result Value Ref Range   Magnesium 2.0 1.7 - 2.4 mg/dL    Comment: Performed at [facility], [address], [place], [place] [address]  Basic metabolic panel     Status: Abnormal   Collection Time: 06/25/23  3:26 AM  Result Value Ref Range   Sodium 139 135 - 145 mmol/L   Potassium 4.2 3.5 - 5.1 mmol/L   Chloride 109 98 - 111 mmol/L   CO2 23 22 - 32 mmol/L   Glucose, Bld 117 (H) 70 - 99 mg/dL    Comment: Glucose reference range applies only to samples taken after fasting for at least 8 hours.   BUN 29 (H) 8 - 23 mg/dL   Creatinine, Ser 8.55 (H) 0.61 - 1.24 mg/dL   Calcium  8.6 (L)  8.9 - 10.3 mg/dL   GFR, Estimated 47 (L) >60 mL/min    Comment: (NOTE) Calculated using the CKD-EPI Creatinine Equation (2021)    Anion gap 7 5 - 15    Comment: Performed at [facility] [address] [place], [place] [address]  Magnesium     Status: None   Collection Time: 06/25/23  3:26 AM  Result Value Ref Range   Magnesium 2.1 1.7 - 2.4 mg/dL    Comment: Performed at [facility], [address] [place], [place] [address]  APTT     Status: Abnormal   Collection Time: 06/25/23  3:26 AM  Result Value Ref Range   aPTT 64 (H) 24 - 36 seconds    Comment:        IF BASELINE aPTT IS ELEVATED, SUGGEST PATIENT RISK ASSESSMENT BE USED TO DETERMINE  APPROPRIATE ANTICOAGULANT THERAPY. Performed at [facility], [address] [place], [place] [address]   CBC     Status: Abnormal   Collection Time: 06/25/23  3:26 AM  Result Value Ref Range   WBC 6.1 4.0 - 10.5 K/uL   RBC 3.67 (L) 4.22 - 5.81 MIL/uL   Hemoglobin 11.2 (L) 13.0 - 17.0 g/dL   HCT 65.9 (L) 60.9 - 47.9 %   MCV 92.6 80.0 - 100.0 fL   MCH 30.5 26.0 - 34.0 pg   MCHC 32.9 30.0 - 36.0 g/dL   RDW 86.5 88.4 - 84.4 %   Platelets 211 150 - 400 K/uL   nRBC 0.0 0.0 - 0.2 %    Comment: Performed at [facility], [address] [place], [place] [address]  Heparin  level (unfractionated)     Status: None   Collection Time: 06/25/23  3:26 AM  Result Value Ref Range   Heparin  Unfractionated 0.39 0.30 - 0.70 IU/mL    Comment: (NOTE) The clinical reportable range upper limit is being lowered to >1.10 to align with the FDA approved guidance for the current laboratory assay.  If heparin  results are below expected values, and patient dosage has  been confirmed, suggest follow up testing of antithrombin III levels. Performed at [facility], [address] [place], [place] [address]   APTT     Status: Abnormal   Collection Time: 06/25/23 12:44 PM  Result Value Ref Range   aPTT 99 (H) 24 - 36 seconds    Comment:        IF BASELINE aPTT IS ELEVATED, SUGGEST PATIENT RISK ASSESSMENT BE USED TO DETERMINE APPROPRIATE ANTICOAGULANT THERAPY. Performed at [facility], [address] [place], [place] [address]   POCT Activated clotting time     Status: None   Collection Time: 06/25/23  3:11 PM  Result Value Ref Range   Activated Clotting Time 273 seconds    Comment: Reference range 74-137 seconds for patients not on anticoagulant therapy.  POCT Activated clotting time     Status: None   Collection Time: 06/25/23  3:27 PM  Result Value Ref Range   Activated Clotting Time 250 seconds    Comment: Reference range 74-137 seconds for patients  not on anticoagulant therapy.  Basic metabolic panel     Status: Abnormal   Collection Time: 06/26/23  5:34 AM  Result Value Ref Range   Sodium 140 135 - 145 mmol/L   Potassium 3.9 3.5 - 5.1 mmol/L   Chloride 110 98 - 111 mmol/L   CO2 21 (L) 22 - 32 mmol/L   Glucose,  Bld 105 (H) 70 - 99 mg/dL    Comment: Glucose reference range applies only to samples taken after fasting for at least 8 hours.   BUN 20 8 - 23 mg/dL   Creatinine, Ser 8.61 (H) 0.61 - 1.24 mg/dL   Calcium  8.5 (L) 8.9 - 10.3 mg/dL   GFR, Estimated 49 (L) >60 mL/min    Comment: (NOTE) Calculated using the CKD-EPI Creatinine Equation (2021)    Anion gap 9 5 - 15    Comment: Performed at [facility], [address] [place], [place] [address]  Magnesium     Status: None   Collection Time: 06/26/23  5:34 AM  Result Value Ref Range   Magnesium 2.0 1.7 - 2.4 mg/dL    Comment: Performed at [facility], [address] [place], [place] [address]  CBC     Status: Abnormal   Collection Time: 06/26/23  5:34 AM  Result Value Ref Range   WBC 5.6 4.0 - 10.5 K/uL   RBC 3.73 (L) 4.22 - 5.81 MIL/uL   Hemoglobin 11.4 (L) 13.0 - 17.0 g/dL   HCT 65.6 (L) 60.9 - 47.9 %   MCV 92.0 80.0 - 100.0 fL   MCH 30.6 26.0 - 34.0 pg   MCHC 33.2 30.0 - 36.0 g/dL   RDW 86.7 88.4 - 84.4 %   Platelets 216 150 - 400 K/uL   nRBC 0.0 0.0 - 0.2 %    Comment: Performed at [facility], [address] [place], [place] [address]  Heparin  level (unfractionated)     Status: None   Collection Time: 06/26/23  5:34 AM  Result Value Ref Range   Heparin  Unfractionated 0.44 0.30 - 0.70 IU/mL    Comment: (NOTE) The clinical reportable range upper limit is being lowered to >1.10 to align with the FDA approved guidance for the current laboratory assay.  If heparin  results are below expected values, and patient dosage has  been confirmed, suggest follow up testing of antithrombin III levels. Performed at [facility],  [address] [place], [place] [address]   APTT     Status: Abnormal   Collection Time: 06/26/23  5:34 AM  Result Value Ref Range   aPTT 84 (H) 24 - 36 seconds    Comment:        IF BASELINE aPTT IS ELEVATED, SUGGEST PATIENT RISK ASSESSMENT BE USED TO DETERMINE APPROPRIATE ANTICOAGULANT THERAPY. Performed at [facility], [address] [place], [place] [address]     Radiology CARDIAC CATHETERIZATION Result Date: 06/25/2023 Conclusions: Stable appearance of severe proximal LAD disease and moderate mid LCx plaque, similar to diagnostic angiogram over the weekend. Normal left ventricular filling pressure (LVEDP 10 mmHg). Successful PCI to 80% proximal LAD stenosis using Onyx Frontier 2.75 x 12 mm drug-eluting stent with 0% residual stenosis and TIMI-3 flow. Right coronary artery not visualized on today's procedure.  It was previously known to have 70% ostial stenosis.  Due to inability to perform hemodynamic assessment due to technical difficulties, further evaluation/intervention to this vessel was deferred. Recommendations: If no evidence of bleeding or vascular injury, transition from heparin  to apixaban  tomorrow morning.  Anticipate clopidogrel  and apixaban  for up to 12 months.  Aspirin  can be discontinued at discharge to minimize risk for bleeding. Continue medical therapy of moderate severe ostial RCA disease.  If the patient has recurrent angina, consider stress test to assess functional significance. Aggressive secondary prevention of coronary artery disease. Continue colchicine  for treatment of  acute pericarditis. Lonni Hanson, MD [facility]  ECHOCARDIOGRAM COMPLETE Result Date: 06/23/2023    ECHOCARDIOGRAM REPORT   Patient Name:   DJ SENTENO Date of Exam: 06/23/2023 Medical Rec #:  [id number]      Height:       69.0 in Accession #:    [id number]     Weight:       217.6 lb Date of Birth:  1933/09/21      BSA:          2.141 m Patient Age:    89 years       BP:            113/67 mmHg Patient Gender: M              HR:           59 bpm. Exam Location:  [facility] Procedure: 2D Echo, Cardiac Doppler and Color Doppler Indications:     Chest Pain R07.9  History:         Patient has prior history of Echocardiogram examinations. Risk                  Factors:Hypertension.  Sonographer:     Bari Roar Referring Phys:  [id number] MICHAEL COOPER Diagnosing Phys: Annabella Scarce MD IMPRESSIONS  1. Left ventricular ejection fraction, by estimation, is 60 to 65%. The left ventricle has normal function. The left ventricle has no regional wall motion abnormalities. There is mild concentric left ventricular hypertrophy. Left ventricular diastolic parameters were normal.  2. Right ventricular systolic function is normal. The right ventricular size is normal. There is moderately elevated pulmonary artery systolic pressure.  3. Left atrial size was severely dilated.  4. Right atrial size was mildly dilated.  5. The mitral valve is normal in structure. Mild mitral valve regurgitation. No evidence of mitral stenosis.  6. The aortic valve is tricuspid. There is mild calcification of the aortic valve. There is mild thickening of the aortic valve. Aortic valve regurgitation is mild. No aortic stenosis is present.  7. The inferior vena cava is dilated in size with <50% respiratory variability, suggesting right atrial pressure of 15 mmHg. FINDINGS  Left Ventricle: Left ventricular ejection fraction, by estimation, is 60 to 65%. The left ventricle has normal function. The left ventricle has no regional wall motion abnormalities. The left ventricular internal cavity size was normal in size. There is  mild concentric left ventricular hypertrophy. Left ventricular diastolic parameters were normal. Indeterminate filling pressures. Right Ventricle: The right ventricular size is normal. No increase in right ventricular wall thickness. Right ventricular systolic function is normal. There is moderately elevated  pulmonary artery systolic pressure. The tricuspid regurgitant velocity is 3.02 m/s, and with an assumed right atrial pressure of 15 mmHg, the estimated right ventricular systolic pressure is 51.5 mmHg. Left Atrium: Left atrial size was severely dilated. Right Atrium: Right atrial size was mildly dilated. Pericardium: There is no evidence of pericardial effusion. Mitral Valve: The mitral valve is normal in structure. Mild mitral valve regurgitation. No evidence of mitral valve stenosis. MV peak gradient, 6.9 mmHg. The mean mitral valve gradient is 2.0 mmHg. Tricuspid Valve: The tricuspid valve is normal in structure. Tricuspid valve regurgitation is mild . No evidence of tricuspid stenosis. Aortic Valve: The aortic valve is tricuspid. There is mild calcification of the aortic valve. There is mild thickening of the aortic valve. Aortic valve regurgitation is mild. No aortic stenosis is present. Aortic valve mean gradient measures 4.0 mmHg.  Aortic valve peak gradient measures 10.0 mmHg. Aortic valve area, by VTI measures 2.77 cm. Pulmonic Valve: The pulmonic valve was normal in structure. Pulmonic valve regurgitation is mild. No evidence of pulmonic stenosis. Aorta: The aortic root is normal in size and structure. Venous: The inferior vena cava is dilated in size with less than 50% respiratory variability, suggesting right atrial pressure of 15 mmHg. IAS/Shunts: No atrial level shunt detected by color flow Doppler.  LEFT VENTRICLE PLAX 2D LVIDd:         5.10 cm   Diastology LVIDs:         3.70 cm   LV e' medial:    10.70 cm/s LV PW:         1.20 cm   LV E/e' medial:  11.0 LV IVS:        1.20 cm   LV e' lateral:   13.70 cm/s LVOT diam:     2.10 cm   LV E/e' lateral: 8.6 LV SV:         69 LV SV Index:   32 LVOT Area:     3.46 cm  RIGHT VENTRICLE RV Basal diam:  3.40 cm RV Mid diam:    3.10 cm RV S prime:     11.30 cm/s TAPSE (M-mode): 1.4 cm LEFT ATRIUM              Index        RIGHT ATRIUM           Index LA diam:         4.60 cm  2.15 cm/m   RA Area:     23.40 cm LA Vol (A2C):   102.0 ml 47.63 ml/m  RA Volume:   68.40 ml  31.94 ml/m LA Vol (A4C):   82.0 ml  38.29 ml/m LA Biplane Vol: 90.8 ml  42.40 ml/m  AORTIC VALVE                    PULMONIC VALVE AV Area (Vmax):    2.37 cm     PV Vmax:          1.04 m/s AV Area (Vmean):   2.35 cm     PV Peak grad:     4.3 mmHg AV Area (VTI):     2.77 cm     PR End Diast Vel: 5.76 msec AV Vmax:           158.00 cm/s  RVOT Peak grad:   2 mmHg AV Vmean:          94.900 cm/s AV VTI:            0.250 m AV Peak Grad:      10.0 mmHg AV Mean Grad:      4.0 mmHg LVOT Vmax:         108.00 cm/s LVOT Vmean:        64.300 cm/s LVOT VTI:          0.200 m LVOT/AV VTI ratio: 0.80  AORTA Ao Root diam: 3.00 cm Ao Asc diam:  3.30 cm MITRAL VALVE                TRICUSPID VALVE MV Area (PHT): 3.33 cm     TR Peak grad:   36.5 mmHg MV Area VTI:   2.06 cm     TR Vmax:        302.00 cm/s MV Peak grad:  6.9 mmHg MV Mean grad:  2.0 mmHg     SHUNTS MV Vmax:       1.31 m/s     Systemic VTI:  0.20 m MV Vmean:      64.9 cm/s    Systemic Diam: 2.10 cm MV Decel Time: 228 msec MV E velocity: 118.00 cm/s Annabella Scarce MD Electronically signed by Annabella Scarce MD Signature Date/Time: 06/23/2023/3:41:20 PM    Final    US  Venous Img Lower Bilateral (DVT) Result Date: 06/22/2023 CLINICAL DATA:  Lower extremity edema EXAM: BILATERAL LOWER EXTREMITY VENOUS DOPPLER ULTRASOUND TECHNIQUE: Gray-scale sonography with graded compression, as well as color Doppler and duplex ultrasound were performed to evaluate the lower extremity deep venous systems from the level of the common femoral vein and including the common femoral, femoral, profunda femoral, popliteal and calf veins including the posterior tibial, peroneal and gastrocnemius veins when visible. The superficial great saphenous vein was also interrogated. Spectral Doppler was utilized to evaluate flow at rest and with distal augmentation maneuvers in the common  femoral, femoral and popliteal veins. COMPARISON:  None Available. FINDINGS: RIGHT LOWER EXTREMITY Common Femoral Vein: No evidence of thrombus. Normal compressibility, respiratory phasicity and response to augmentation. Saphenofemoral Junction: No evidence of thrombus. Normal compressibility and flow on color Doppler imaging. Profunda Femoral Vein: No evidence of thrombus. Normal compressibility and flow on color Doppler imaging. Femoral Vein: No evidence of thrombus. Normal compressibility, respiratory phasicity and response to augmentation. Popliteal Vein: No evidence of thrombus. Normal compressibility, respiratory phasicity and response to augmentation. Calf Veins: No evidence of thrombus. Normal compressibility and flow on color Doppler imaging. Superficial Great Saphenous Vein: No evidence of thrombus. Normal compressibility. Venous Reflux:  None. Other Findings:  None. LEFT LOWER EXTREMITY Common Femoral Vein: No evidence of thrombus. Normal compressibility, respiratory phasicity and response to augmentation. Saphenofemoral Junction: No evidence of thrombus. Normal compressibility and flow on color Doppler imaging. Profunda Femoral Vein: No evidence of thrombus. Normal compressibility and flow on color Doppler imaging. Femoral Vein: No evidence of thrombus. Normal compressibility, respiratory phasicity and response to augmentation. Popliteal Vein: No evidence of thrombus. Normal compressibility, respiratory phasicity and response to augmentation. Calf Veins: No evidence of thrombus. Normal compressibility and flow on color Doppler imaging. Superficial Great Saphenous Vein: No evidence of thrombus. Normal compressibility. Venous Reflux:  None. Other Findings:  None. IMPRESSION: No evidence of deep venous thrombosis in either lower extremity. Electronically Signed   By: Wilkie Lent M.D.   On: 06/22/2023 15:20   CARDIAC CATHETERIZATION Result Date: 06/22/2023 1.  Two-vessel coronary artery disease  with moderately severe stenosis of the proximal right coronary artery and severe stenosis of the proximal LAD 2.  Patency of the left main and left circumflex with no obstructive disease 3.  Normal LVEDP Recommendations: Recommend IV fluid hydration, repeat renal function labs, hold Eliquis  and use IV heparin , check 2D echocardiogram to rule out pericardial effusion, check CRP and sed rate to evaluate for pericarditis, consider staged PCI on Monday if renal function stable and no pericardial effusion seen on echo.  While I think his coronary lesions are hemodynamically significant, it is not clear that he has a plaque rupture or culprit lesion.  Plan discussed with hospitalist team and patient's family over the telephone.   DG Chest 2 View Result Date: 06/22/2023 CLINICAL DATA:  Chest pain. EXAM: CHEST - 2 VIEW COMPARISON:  None Available. FINDINGS: The heart size and mediastinal contours are within normal limits. Minimal bibasilar subsegmental atelectasis or scarring is noted. The visualized skeletal  structures are unremarkable. IMPRESSION: Minimal bibasilar subsegmental atelectasis or scarring. Electronically Signed   By: Lynwood Landy Raddle M.D.   On: 06/22/2023 09:13    Assessment/Plan  Varicose veins of leg with edema, right A reflux study was performed today demonstrating a long segment reflux in the right great saphenous vein throughout its course including the saphenofemoral junction.  The left great saphenous vein also has significant reflux throughout most of its course including the saphenofemoral junction.  His swelling is significantly better after his coronary intervention, but it does persist and is worse on the right leg than the left leg.  We discussed the venous disease in detail today.  We discussed the risks and benefits of laser ablation of the great saphenous vein.  We would do his right leg first as this is the more severely affected leg, and then the left leg can be done in the future.   He desires to proceed with laser ablation of the great saphenous veins.  Carotid stenosis Carotid duplex today demonstrates stable 1 to 39% ICA stenosis bilaterally.  No role for intervention at this level.  He is on Plavix , Eliquis , and Lipitor .  This may be more for his cardiac disease and his carotid disease, but this would be (more than) adequate therapy for his carotid disease as well.  Follow-up in 1 year with carotid duplex.  Benign hypertension blood pressure control important in reducing the progression of atherosclerotic disease. On appropriate oral medications.     Prediabetes blood glucose control important in reducing the progression of atherosclerotic disease. Also, involved in wound healing. Not on medications.     Hyperlipidemia lipid control important in reducing the progression of atherosclerotic disease. Continue statin therapy  Selinda Gu, MD  07/02/2023 10:25 AM    This note was created with Dragon medical transcription system.  Any errors from dictation are purely unintentional

## 2023-07-10 ENCOUNTER — Ambulatory Visit: Payer: Medicare HMO | Admitting: Nurse Practitioner

## 2023-07-10 DIAGNOSIS — I2510 Atherosclerotic heart disease of native coronary artery without angina pectoris: Secondary | ICD-10-CM | POA: Diagnosis not present

## 2023-07-10 DIAGNOSIS — Z03818 Encounter for observation for suspected exposure to other biological agents ruled out: Secondary | ICD-10-CM | POA: Diagnosis not present

## 2023-07-10 DIAGNOSIS — I4891 Unspecified atrial fibrillation: Secondary | ICD-10-CM | POA: Diagnosis not present

## 2023-07-10 DIAGNOSIS — R051 Acute cough: Secondary | ICD-10-CM | POA: Diagnosis not present

## 2023-07-10 DIAGNOSIS — R0981 Nasal congestion: Secondary | ICD-10-CM | POA: Diagnosis not present

## 2023-07-10 DIAGNOSIS — I309 Acute pericarditis, unspecified: Secondary | ICD-10-CM | POA: Diagnosis not present

## 2023-07-10 DIAGNOSIS — E119 Type 2 diabetes mellitus without complications: Secondary | ICD-10-CM | POA: Diagnosis not present

## 2023-07-10 DIAGNOSIS — I10 Essential (primary) hypertension: Secondary | ICD-10-CM | POA: Diagnosis not present

## 2023-07-19 ENCOUNTER — Encounter: Payer: Self-pay | Admitting: Nurse Practitioner

## 2023-07-19 ENCOUNTER — Ambulatory Visit: Payer: PPO | Attending: Nurse Practitioner

## 2023-07-19 ENCOUNTER — Ambulatory Visit: Payer: PPO | Attending: Nurse Practitioner | Admitting: Nurse Practitioner

## 2023-07-19 VITALS — BP 112/62 | HR 63 | Ht 69.0 in | Wt 204.4 lb

## 2023-07-19 DIAGNOSIS — I721 Aneurysm of artery of upper extremity: Secondary | ICD-10-CM

## 2023-07-19 DIAGNOSIS — I4821 Permanent atrial fibrillation: Secondary | ICD-10-CM | POA: Diagnosis not present

## 2023-07-19 DIAGNOSIS — I729 Aneurysm of unspecified site: Secondary | ICD-10-CM

## 2023-07-19 DIAGNOSIS — E785 Hyperlipidemia, unspecified: Secondary | ICD-10-CM

## 2023-07-19 DIAGNOSIS — M79601 Pain in right arm: Secondary | ICD-10-CM | POA: Diagnosis not present

## 2023-07-19 DIAGNOSIS — I10 Essential (primary) hypertension: Secondary | ICD-10-CM

## 2023-07-19 DIAGNOSIS — I300 Acute nonspecific idiopathic pericarditis: Secondary | ICD-10-CM | POA: Diagnosis not present

## 2023-07-19 DIAGNOSIS — T81718D Complication of other artery following a procedure, not elsewhere classified, subsequent encounter: Secondary | ICD-10-CM | POA: Diagnosis not present

## 2023-07-19 DIAGNOSIS — I2510 Atherosclerotic heart disease of native coronary artery without angina pectoris: Secondary | ICD-10-CM | POA: Diagnosis not present

## 2023-07-19 MED ORDER — RIVAROXABAN 15 MG PO TABS: 15.0000 mg | ORAL_TABLET | Freq: Every day | ORAL | 11 refills | Status: DC

## 2023-07-19 NOTE — Patient Instructions (Signed)
Medication Instructions:  STOP the Eliquis  START Xarelto 15 mg once daily  *If you need a refill on your cardiac medications before your next appointment, please call your pharmacy*   Lab Work: None ordered If you have labs (blood work) drawn today and your tests are completely normal, you will receive your results only by: MyChart Message (if you have MyChart) OR A paper copy in the mail If you have any lab test that is abnormal or we need to change your treatment, we will call you to review the results.   Testing/Procedures: Your physician has requested that you have a upper extremity arterial duplex. During this test, ultrasound is used to evaluate arterial blood flow in the arm. Allow one hour for this exam. There are no restrictions or special instructions. This will take place at [address] [address], [place] [address]  Follow-Up: At [facility], you and your health needs are our priority.  As part of our continuing mission to provide you with exceptional heart care, we have created designated Provider Care Teams.  These Care Teams include your primary Cardiologist (physician) and Advanced Practice Providers (APPs -  Physician Assistants and Nurse Practitioners) who all work together to provide you with the care you need, when you need it.  We recommend signing up for the patient portal called "MyChart".  Sign up information is provided on this After Visit Summary.  MyChart is used to connect with patients for Virtual Visits (Telemedicine).  Patients are able to view lab/test results, encounter notes, upcoming appointments, etc.  Non-urgent messages can be sent to your provider as well.   To learn more about what you can do with MyChart, go to [email or web address].    Your next appointment:   1 month(s)  Provider:   You may see Debbe Odea, MD or one of the following Advanced Practice Providers on your designated Care Team:    Nicolasa Ducking, NP  Appointment on Monday with [facility], Dr. Wyn Quaker

## 2023-07-19 NOTE — Progress Notes (Signed)
Office Visit    Patient Name: Jonathan [name] Date of Encounter: 07/19/2023  Primary Care Provider:  Marina Goodell, MD Primary Cardiologist:  Debbe Odea, MD  Chief Complaint    89 y.o. male with a history of permanent atrial fibrillation, hypertension, hyperlipidemia, prediabetes, stroke, and CAD, who presents for follow-up after recent hospitalization for pericarditis and CAD requiring PCI.  Past Medical History  Subjective   Past Medical History:  Diagnosis Date   Acute pericarditis    a. 05/2023-->colchicine x 3 mos.   CAD (coronary artery disease)    a. 05/2023 PCI: LM nl, LAD 80p (2.75x12 Onyx DES), LCX 46m, RCA 70ost/p.   Hypercholesteremia    Hypertension    Mitral regurgitation    a. 02/2023 Echo: EF 60-65%, no rwma, mild LVH, nl RV fxn, RVSP 39.45mmHg, mod dil LA, mod MR, mild-mod TR, AoV sclerosis.   Permanent atrial fibrillation (HCC)    a. CHA2DS2VASc = 6-->eliquis.   Pre-diabetes    Prostate cancer (HCC)    Stroke [facility])    Past Surgical History:  Procedure Laterality Date   CORONARY STENT INTERVENTION N/A 06/25/2023   Procedure: CORONARY STENT INTERVENTION;  Surgeon: Yvonne Kendall, MD;  Location: [facility] INVASIVE CV LAB;  Service: Cardiovascular;  Laterality: N/A;   CORONARY/GRAFT ACUTE MI REVASCULARIZATION N/A 06/22/2023   Procedure: Coronary/Graft Acute MI Revascularization;  Surgeon: Tonny Bollman, MD;  Location: [facility] INVASIVE CV LAB;  Service: Cardiovascular;  Laterality: N/A;   LEFT HEART CATH AND CORONARY ANGIOGRAPHY N/A 06/22/2023   Procedure: LEFT HEART CATH AND CORONARY ANGIOGRAPHY;  Surgeon: Tonny Bollman, MD;  Location: [facility] INVASIVE CV LAB;  Service: Cardiovascular;  Laterality: N/A;   LEFT HEART CATH AND CORONARY ANGIOGRAPHY N/A 06/25/2023   Procedure: LEFT HEART CATH AND CORONARY ANGIOGRAPHY;  Surgeon: Yvonne Kendall, MD;  Location: [facility] INVASIVE CV LAB;  Service: Cardiovascular;  Laterality: N/A;   PROSTATE SURGERY      SKIN BIOPSY     basel cell    Allergies  Allergies  Allergen Reactions   Penicillin V Potassium Other (See Comments)   Penicillins Rash      History of Present Illness      89 y.o. y/o male with a history of permanent atrial fibrillation, hypertension, hyperlipidemia, prediabetes, stroke, pericarditis, and CAD.  Patient was admitted to [facility] in late December with complaints of chest pain and ST segment elevation.  He underwent diagnostic catheterization which revealed severe proximal LAD and RCA disease without evidence of plaque rupture or thrombus.  Troponins also returned normal.  Presentation was more consistent with pericarditis, and intervention was deferred, and he was placed on colchicine therapy.  Echocardiogram showed normal LV function without effusion.  He had severe biatrial enlargement in the setting of permanent A-fib.  Chest pain improved on colchicine.  He underwent repeat catheterization and PCI on June 25, 2003, with drug-eluting stent placement to the proximal LAD.  The RCA was medically managed.  He was discharged home in January 1 on Plavix (at least 6 preferably 12 months) and Eliquis as well as colchicine x 3 months.    Patient says that since discharge, he has been feeling well.  He has not had any chest pain.  Unfortunately, he did develop the flu following discharge.  He is now finally recovering from that and breathing has improved.  His wife also developed the flu and was hospitalized for 4 days.  He has been taking care of her.  He he also reports  that since discharge, he has had an area of bulging over his right radial catheterization site.  He thinks maybe it has gotten a little bit larger over the past 3 weeks and perhaps a little more ecchymotic but he does not have any pain at the area.  He thinks it was present prior to discharge but he has not alerted anybody to it.  He also asks if he can be switched off of Eliquis.  Since being placed on it, in  September 2024, he has had a metallic taste in his mouth and has lost his appetite.  He says it made him miserable.  He knows that is the Eliquis causing this because while he was hospitalized recently and was on heparin, he had return of his taste but as soon as he got placed back on Eliquis, he noted loss of taste and as he describes, misery.  He would like to switch to Xarelto, which his wife takes.  He denies palpitations, PND, orthopnea, dizziness, syncope, edema, or early satiety. Objective  Home Medications    Current Outpatient Medications  Medication Sig Dispense Refill   acetaminophen (TYLENOL) 500 MG tablet Take 1 tablet (500 mg total) by mouth every 6 (six) hours as needed. 30 tablet 0   atorvastatin (LIPITOR) 80 MG tablet Take 1 tablet (80 mg total) by mouth daily. 90 tablet 1   Cholecalciferol (VITAMIN D) 2000 units CAPS Take 1 capsule (2,000 Units total) by mouth daily. 30 capsule    clopidogrel (PLAVIX) 75 MG tablet Take 1 tablet (75 mg total) by mouth daily. 90 tablet 1   colchicine 0.6 MG tablet Take 1 tablet (0.6 mg total) by mouth daily. 90 tablet 0   Cyanocobalamin (B-12) 500 MCG TABS Take 1 tablet by mouth daily. 150 tablet    lisinopril (ZESTRIL) 20 MG tablet Take 1 tablet (20 mg total) by mouth daily. 90 tablet 3   metoprolol succinate (TOPROL XL) 25 MG 24 hr tablet Take 1 tablet (25 mg total) by mouth daily. 90 tablet 3   Rivaroxaban (XARELTO) 15 MG TABS tablet Take 1 tablet (15 mg total) by mouth daily with supper. 30 tablet 11   Triamcinolone Acetonide (NASACORT ALLERGY 24HR NA) Place into the nose.     No current facility-administered medications for this visit.     Physical Exam    VS:  BP 112/62   Pulse 63   Ht 5\' 9"  (1.753 m)   Wt 204 lb 6.4 oz (92.7 kg)   SpO2 97%   BMI 30.18 kg/m  , BMI Body mass index is 30.18 kg/m.       Cardiac Rehabilitation Eligibility Assessment  The patient is ready to start cardiac rehabilitation from a cardiac standpoint.    GEN: Well nourished, well developed, in no acute distress. HEENT: normal. Neck: Supple, no JVD, carotid bruits, or masses. Cardiac: Irregularly irregular, no murmurs, rubs, or gallops. No clubbing, cyanosis, edema.  Radials 2+/PT 2+ and equal bilaterally.  Large aneurysmal bulge measuring approximately 2 cm in diameter.  Positive bruit and thrill.  Distal to the area, the radial pulse is pulsatile.  Normal capillary refill. Respiratory:  Respirations regular and unlabored, clear to auscultation bilaterally. GI: Soft, nontender, nondistended, BS + x 4. MS: no deformity or atrophy. Skin: warm and dry, no rash. Neuro:  Strength and sensation are intact. Psych: Normal affect.  Accessory Clinical Findings    ECG personally reviewed by me today - EKG Interpretation Date/Time:  Friday July 19 2023  14:22:03 EST Ventricular Rate:  63 PR Interval:    QRS Duration:  104 QT Interval:  396 QTC Calculation: 405 R Axis:   -12  Text Interpretation: Atrial fibrillation Incomplete right bundle branch block Confirmed by Nicolasa Ducking [phone]) on 07/19/2023 2:27:19 PM  - no acute changes.  Lab Results  Component Value Date   WBC 5.6 06/26/2023   HGB 11.4 (L) 06/26/2023   HCT 34.3 (L) 06/26/2023   MCV 92.0 06/26/2023   PLT 216 06/26/2023   Lab Results  Component Value Date   CREATININE 1.38 (H) 06/26/2023   BUN 20 06/26/2023   NA 140 06/26/2023   K 3.9 06/26/2023   CL 110 06/26/2023   CO2 21 (L) 06/26/2023   Lab Results  Component Value Date   ALT 14 03/01/2023   AST 16 03/01/2023   ALKPHOS 99 03/01/2023   BILITOT 1.4 (H) 03/01/2023   Lab Results  Component Value Date   CHOL 152 06/22/2023   HDL 63 06/22/2023   LDLCALC 78 06/22/2023   TRIG 56 06/22/2023   CHOLHDL 2.4 06/22/2023    Lab Results  Component Value Date   HGBA1C 6.0 (H) 06/22/2023   Lab Results  Component Value Date   TSH 1.700 06/08/2016       Assessment & Plan    1.  Acute pericarditis: Patient  recently admitted with chest pain and EKG abnormalities with diffuse ST segment elevation.  It was initially called as a STEMI.  Catheterization revealed severe proximal LAD and moderate RCA disease.  Though he subsequently underwent staged intervention of the LAD, he was treated for pericarditis with significant improvement in symptoms with colchicine therapy.  Echo during admission showed normal LV function without any effusion.  He has not had recurrent chest pain and remains on colchicine 0.6 mg daily.  He will complete a 90-month course.  2.  Coronary artery disease: As above, status post LAD stenting.  He has residual proximal RCA disease.  He has not been having any chest pain.  He has had some dyspnea in the setting of recent flu, though feels that he is improving.  He remains on statin, Plavix, beta-blocker, and ACE inhibitor.  No aspirin in the setting of DOAC.  Patient has developed a right radial pseudoaneurysm (see below) for which we obtained an ultrasound today and he will see vascular surgery next week.  3.  Right radial artery pseudoaneurysm: Patient has noted pulsatile bulging over his right radial artery since discharge.  He thinks it may have been present when he left the hospital though our notes indicate that his wrist was ecchymotic without bruit or hematoma.  The area is painless, though he thinks maybe it has grown in size since discharge, and perhaps is a slightly more discolored.  He has a good distal radial pulse and normal capillary refill in his hand.  We obtained an ultrasound today which preliminarily showed a 2 cm x 1 cm pseudoaneurysm with a fairly large mouth.  This has been reviewed with Dr. Kirke Corin and also Dr. Jacinto Halim, who read the ultrasound.  Patient is scheduled to see vascular surgery next week.  4.  Permanent atrial fibrillation: He remains asymptomatic and well rate controlled on beta-blocker therapy.  He has been on Eliquis, which he says has altered his taste and made  his life miserable.  He would like to switch to Xarelto.  We have sent in a prescription for Xarelto 15 mg daily (creatinine clearance less than 50).  He understands that pending surgical evaluation for right radial pseudoaneurysm, he will need to hold Xarelto at vascular surgery's discretion.  5.  Primary hypertension: Well-controlled on beta-blocker and ACE inhibitor therapy.  6.  Hyperlipidemia: LDL of 78 in December.  He is on atorvastatin 80 mg, which was just titrated to this dose at that time.  Will need follow-up lipids and LFTs in about a month at follow-up.  7.  Disposition: Patient to follow with vascular surgery next week related to right radial pseudoaneurysm.  Follow-up here in 1 month or sooner if necessary.  Nicolasa Ducking, NP 07/19/2023, 5:00 PM

## 2023-07-22 ENCOUNTER — Telehealth: Payer: Self-pay | Admitting: Cardiology

## 2023-07-22 DIAGNOSIS — I4821 Permanent atrial fibrillation: Secondary | ICD-10-CM

## 2023-07-22 MED ORDER — RIVAROXABAN 15 MG PO TABS: 15.0000 mg | ORAL_TABLET | Freq: Every day | ORAL | 1 refills | Status: DC

## 2023-07-22 NOTE — Telephone Encounter (Signed)
*  STAT* If patient is at the pharmacy, call can be transferred to refill team.   1. Which medications need to be refilled? (please list name of each medication and dose if known)   Rivaroxaban (XARELTO) 15 MG TABS tablet     2. Would you like to learn more about the convenience, safety, & potential cost savings by using the [facility]? No    3. Are you open to using the [facility] No   4. Which pharmacy/location (including street and city if local pharmacy) is medication to be sent to? [id number] - [place], [place] - [address]     5. Do they need a 30 day or 90 day supply? 90 Day Supply

## 2023-07-22 NOTE — Telephone Encounter (Signed)
Prescription refill request for Xarelto received.  Indication: Afib  Last office visit:  07/19/23 Brion Aliment)  Weight: 92.7kg Age: 89 Scr: 1.38 (06/26/23)  CrCl: 49.46ml/min  Appropriate dose. Refill sent.

## 2023-07-26 ENCOUNTER — Other Ambulatory Visit (INDEPENDENT_AMBULATORY_CARE_PROVIDER_SITE_OTHER): Payer: Self-pay | Admitting: Nurse Practitioner

## 2023-07-26 ENCOUNTER — Encounter
Admission: RE | Admit: 2023-07-26 | Discharge: 2023-07-26 | Disposition: A | Discharge status: Home / Self Care | Payer: PPO | Source: Ambulatory Visit | Attending: Vascular Surgery | Admitting: Vascular Surgery

## 2023-07-26 ENCOUNTER — Other Ambulatory Visit: Payer: Self-pay

## 2023-07-26 ENCOUNTER — Telehealth (INDEPENDENT_AMBULATORY_CARE_PROVIDER_SITE_OTHER): Payer: Self-pay

## 2023-07-26 ENCOUNTER — Ambulatory Visit (INDEPENDENT_AMBULATORY_CARE_PROVIDER_SITE_OTHER): Payer: PPO | Admitting: Vascular Surgery

## 2023-07-26 VITALS — BP 139/72 | HR 72 | Resp 16 | Wt 198.8 lb

## 2023-07-26 DIAGNOSIS — I729 Aneurysm of unspecified site: Secondary | ICD-10-CM

## 2023-07-26 DIAGNOSIS — R7303 Prediabetes: Secondary | ICD-10-CM

## 2023-07-26 DIAGNOSIS — I6523 Occlusion and stenosis of bilateral carotid arteries: Secondary | ICD-10-CM

## 2023-07-26 DIAGNOSIS — E785 Hyperlipidemia, unspecified: Secondary | ICD-10-CM

## 2023-07-26 DIAGNOSIS — I83891 Varicose veins of right lower extremities with other complications: Secondary | ICD-10-CM | POA: Diagnosis not present

## 2023-07-26 DIAGNOSIS — I10 Essential (primary) hypertension: Secondary | ICD-10-CM

## 2023-07-26 NOTE — Progress Notes (Signed)
MRN : [id number]  Jonathan [name] is a 89 y.o. (February 16, 1934) male who presents with chief complaint of  Chief Complaint  Patient presents with   Follow-up    Consult right wrist aneurysm   .  History of Present Illness: Patient returns today in follow up prior to his scheduled visit for carotid disease and venous disease for a new right radial artery pseudoaneurysm.  This occurred after a cardiac catheterization from the right wrist earlier this month.  This has had a steadily enlarging bulge and is now 2 to 3 cm in diameter.  A duplex was done at his cardiologist office which showed a widemouth pseudoaneurysm of the right radial artery.  He is really having no pain.  He does not have any ischemic symptoms to his hand.  He is doing well from a cardiac standpoint after his intervention.  Current Outpatient Medications  Medication Sig Dispense Refill   acetaminophen (TYLENOL) 500 MG tablet Take 1 tablet (500 mg total) by mouth every 6 (six) hours as needed. 30 tablet 0   atorvastatin (LIPITOR) 80 MG tablet Take 1 tablet (80 mg total) by mouth daily. 90 tablet 1   Cholecalciferol (VITAMIN D) 2000 units CAPS Take 1 capsule (2,000 Units total) by mouth daily. 30 capsule    clopidogrel (PLAVIX) 75 MG tablet Take 1 tablet (75 mg total) by mouth daily. 90 tablet 1   colchicine 0.6 MG tablet Take 1 tablet (0.6 mg total) by mouth daily. 90 tablet 0   Cyanocobalamin (B-12) 500 MCG TABS Take 1 tablet by mouth daily. 150 tablet    lisinopril (ZESTRIL) 20 MG tablet Take 1 tablet (20 mg total) by mouth daily. 90 tablet 3   metoprolol succinate (TOPROL XL) 25 MG 24 hr tablet Take 1 tablet (25 mg total) by mouth daily. 90 tablet 3   Rivaroxaban (XARELTO) 15 MG TABS tablet Take 1 tablet (15 mg total) by mouth daily with supper. 90 tablet 1   Triamcinolone Acetonide (NASACORT ALLERGY 24HR NA) Place into the nose.     No current facility-administered medications for this visit.    Past Medical  History:  Diagnosis Date   Acute pericarditis    a. 05/2023-->colchicine x 3 mos.   CAD (coronary artery disease)    a. 05/2023 PCI: LM nl, LAD 80p (2.75x12 Onyx DES), LCX 44m, RCA 70ost/p.   Hypercholesteremia    Hypertension    Mitral regurgitation    a. 02/2023 Echo: EF 60-65%, no rwma, mild LVH, nl RV fxn, RVSP 39.10mmHg, mod dil LA, mod MR, mild-mod TR, AoV sclerosis.   Permanent atrial fibrillation (HCC)    a. CHA2DS2VASc = 6-->eliquis.   Pre-diabetes    Prostate cancer (HCC)    Stroke [facility])     Past Surgical History:  Procedure Laterality Date   CORONARY STENT INTERVENTION N/A 06/25/2023   Procedure: CORONARY STENT INTERVENTION;  Surgeon: Yvonne Kendall, MD;  Location: [facility] INVASIVE CV LAB;  Service: Cardiovascular;  Laterality: N/A;   CORONARY/GRAFT ACUTE MI REVASCULARIZATION N/A 06/22/2023   Procedure: Coronary/Graft Acute MI Revascularization;  Surgeon: Tonny Bollman, MD;  Location: [facility] INVASIVE CV LAB;  Service: Cardiovascular;  Laterality: N/A;   LEFT HEART CATH AND CORONARY ANGIOGRAPHY N/A 06/22/2023   Procedure: LEFT HEART CATH AND CORONARY ANGIOGRAPHY;  Surgeon: Tonny Bollman, MD;  Location: [facility] INVASIVE CV LAB;  Service: Cardiovascular;  Laterality: N/A;   LEFT HEART CATH AND CORONARY ANGIOGRAPHY N/A 06/25/2023   Procedure: LEFT HEART CATH AND CORONARY ANGIOGRAPHY;  Surgeon: Yvonne Kendall, MD;  Location: [facility] INVASIVE CV LAB;  Service: Cardiovascular;  Laterality: N/A;   PROSTATE SURGERY     SKIN BIOPSY     basel cell     Social History   Tobacco Use   Smoking status: Former    Types: Cigars    Quit date: 1994    Years since quitting: 31.1   Smokeless tobacco: Never  Vaping Use   Vaping status: Never Used  Substance Use Topics   Alcohol use: Yes    Comment: occasional/2 beers a month   Drug use: Never      Family History  Problem Relation Age of Onset   Heart disease Maternal Uncle    Heart disease Maternal Grandfather   No bleeding or  clotting disorders  Allergies  Allergen Reactions   Penicillin V Potassium Other (See Comments)   Penicillins Rash    REVIEW OF SYSTEMS (Negative unless checked)   Constitutional: [] Weight loss  [] Fever  [] Chills Cardiac: [] Chest pain   [] Chest pressure   [] Palpitations   [] Shortness of breath when laying flat   [] Shortness of breath at rest   [] Shortness of breath with exertion. Vascular:  [] Pain in legs with walking   [] Pain in legs at rest   [] Pain in legs when laying flat   [] Claudication   [] Pain in feet when walking  [] Pain in feet at rest  [] Pain in feet when laying flat   [] History of DVT   [] Phlebitis   [] Swelling in legs   [x] Varicose veins   [] Non-healing ulcers Pulmonary:   [] Uses home oxygen   [] Productive cough   [] Hemoptysis   [] Wheeze  [] COPD   [] Asthma Neurologic:  [] Dizziness  [] Blackouts   [] Seizures   [x] History of stroke   [] History of TIA  [] Aphasia   [] Temporary blindness   [] Dysphagia   [] Weakness or numbness in arms   [] Weakness or numbness in legs Musculoskeletal:  [x] Arthritis   [] Joint swelling   [x] Joint pain   [] Low back pain Hematologic:  [] Easy bruising  [] Easy bleeding   [] Hypercoagulable state   [] Anemic   Gastrointestinal:  [] Blood in stool   [] Vomiting blood  [] Gastroesophageal reflux/heartburn   [] Abdominal pain Genitourinary:  [] Chronic kidney disease   [] Difficult urination  [] Frequent urination  [] Burning with urination   [] Hematuria Skin:  [] Rashes   [] Ulcers   [] Wounds Psychological:  [] History of anxiety   []  History of major depression.  Physical Examination  BP 139/72   Pulse 72   Resp 16   Wt 198 lb 12.8 oz (90.2 kg)   BMI 29.36 kg/m  Gen:  WD/WN, NAD. Appears younger than stated age Head: [place]/AT, No temporalis wasting. Ear/Nose/Throat: Hearing grossly intact, nares w/o erythema or drainage Eyes: Conjunctiva clear. Sclera non-icteric Neck: Supple.  Trachea midline Pulmonary:  Good air movement, no use of accessory muscles.  Cardiac:  Bradycardic  Vascular:  Vessel Right Left  Radial Enlarged Palpable Palpable           Musculoskeletal: M/S 5/5 throughout.  No deformity or atrophy.  There is a 2 to 3 cm easily palpable right radial artery aneurysm present.  There is no immediate skin threat.  It is not particularly tender to palpation.  His hand is warm and well-perfused.  Even with compression of the radial artery, his hand remains pink with good capillary refill.  1+ bilateral lower extremity edema. Neurologic: Sensation grossly intact in extremities.  Symmetrical.  Speech is fluent.  Psychiatric: Judgment intact, Mood &  affect appropriate for pt's clinical situation. Dermatologic: No rashes or ulcers noted.  No cellulitis or open wounds.      Labs Recent Results (from the past 2160 hours)  Basic metabolic panel     Status: Abnormal   Collection Time: 06/22/23  8:57 AM  Result Value Ref Range   Sodium 138 135 - 145 mmol/L   Potassium 4.2 3.5 - 5.1 mmol/L   Chloride 107 98 - 111 mmol/L   CO2 22 22 - 32 mmol/L   Glucose, Bld 120 (H) 70 - 99 mg/dL    Comment: Glucose reference range applies only to samples taken after fasting for at least 8 hours.   BUN 33 (H) 8 - 23 mg/dL   Creatinine, Ser 1.61 (H) 0.61 - 1.24 mg/dL   Calcium 8.3 (L) 8.9 - 10.3 mg/dL   GFR, Estimated 43 (L) >60 mL/min    Comment: (NOTE) Calculated using the CKD-EPI Creatinine Equation (2021)    Anion gap 9 5 - 15    Comment: Performed at [facility], [address] [place], [place] [address]  CBC     Status: Abnormal   Collection Time: 06/22/23  8:57 AM  Result Value Ref Range   WBC 8.8 4.0 - 10.5 K/uL   RBC 3.77 (L) 4.22 - 5.81 MIL/uL   Hemoglobin 11.6 (L) 13.0 - 17.0 g/dL   HCT 54.0 (L) 98.1 - 19.1 %   MCV 95.8 80.0 - 100.0 fL   MCH 30.8 26.0 - 34.0 pg   MCHC 32.1 30.0 - 36.0 g/dL   RDW 47.8 29.5 - 62.1 %   Platelets 192 150 - 400 K/uL   nRBC 0.0 0.0 - 0.2 %    Comment: Performed at [facility], [address], [place], [place] [address]  Troponin I (High Sensitivity)     Status: None   Collection Time: 06/22/23  8:57 AM  Result Value Ref Range   Troponin I (High Sensitivity) 11 <18 ng/L    Comment: (NOTE) Elevated high sensitivity troponin I (hsTnI) values and significant  changes across serial measurements may suggest ACS but many other  chronic and acute conditions are known to elevate hsTnI results.  Refer to the "Links" section for chest pain algorithms and additional  guidance. Performed at [facility], [address] [place], [place] [address]   Hemoglobin A1c     Status: Abnormal   Collection Time: 06/22/23  9:40 AM  Result Value Ref Range   Hgb A1c MFr Bld 6.0 (H) 4.8 - 5.6 %    Comment: (NOTE)         Prediabetes: 5.7 - 6.4         Diabetes: >6.4         Glycemic control for adults with diabetes: <7.0    Mean Plasma Glucose 126 mg/dL    Comment: (NOTE) Performed At: [facility] [address] [place], [place] [id number] Jolene Schimke MD KG:[id number]   Protime-INR     Status: Abnormal   Collection Time: 06/22/23  9:40 AM  Result Value Ref Range   Prothrombin Time 21.0 (H) 11.4 - 15.2 seconds   INR 1.8 (H) 0.8 - 1.2    Comment: (NOTE) INR goal varies based on device and disease states. Performed at [facility], [address] [place], [place] [address]   APTT     Status: Abnormal   Collection Time: 06/22/23  9:40 AM  Result Value Ref Range   aPTT  50 (H) 24 - 36 seconds    Comment:        IF BASELINE aPTT IS ELEVATED, SUGGEST PATIENT RISK ASSESSMENT BE USED TO DETERMINE APPROPRIATE ANTICOAGULANT THERAPY. Performed at [facility], [address] [place], [place] [address]   Lipid panel     Status: None   Collection Time: 06/22/23  9:40 AM  Result Value Ref Range   Cholesterol 152 0 - 200 mg/dL   Triglycerides 56 <604 mg/dL   HDL 63 >54 mg/dL   Total CHOL/HDL Ratio 2.4 RATIO   VLDL 11 0 - 40 mg/dL   LDL  Cholesterol 78 0 - 99 mg/dL    Comment:        Total Cholesterol/HDL:CHD Risk Coronary Heart Disease Risk Table                     Men   Women  1/2 Average Risk   3.4   3.3  Average Risk       5.0   4.4  2 X Average Risk   9.6   7.1  3 X Average Risk  23.4   11.0        Use the calculated Patient Ratio above and the CHD Risk Table to determine the patient's CHD Risk.        ATP III CLASSIFICATION (LDL):  <100     mg/dL   Optimal  098-119  mg/dL   Near or Above                    Optimal  130-159  mg/dL   Borderline  147-829  mg/dL   High  >562     mg/dL   Very High Performed at [facility], [address] [place], [place] [address]   Heparin level (unfractionated)     Status: Abnormal   Collection Time: 06/22/23  9:40 AM  Result Value Ref Range   Heparin Unfractionated >1.10 (H) 0.30 - 0.70 IU/mL    Comment: (NOTE) The clinical reportable range upper limit is being lowered to >1.10 to align with the FDA approved guidance for the current laboratory assay.  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 Performed at [facility], [address] [place], [place] [address]   Glucose, capillary     Status: Abnormal   Collection Time: 06/22/23 11:30 AM  Result Value Ref Range   Glucose-Capillary 102 (H) 70 - 99 mg/dL    Comment: Glucose reference range applies only to samples taken after fasting for at least 8 hours.  MRSA Next Gen by PCR, Nasal     Status: None   Collection Time: 06/22/23 11:31 AM   Specimen: Nasal Mucosa; Nasal Swab  Result Value Ref Range   MRSA by PCR Next Gen NOT DETECTED NOT DETECTED    Comment: (NOTE) The GeneXpert MRSA Assay (FDA approved for NASAL specimens only), is one component of a comprehensive MRSA colonization surveillance program. It is not intended to diagnose MRSA infection nor to guide or monitor treatment for MRSA  infections. Test performance is not FDA approved in patients less than 60 years old. Performed at [facility], [address] [place], [place] [address]   Troponin I (High Sensitivity)     Status: None   Collection Time: 06/22/23 11:43 AM  Result Value Ref Range   Troponin I (High Sensitivity) 13 <  18 ng/L    Comment: (NOTE) Elevated high sensitivity troponin I (hsTnI) values and significant  changes across serial measurements may suggest ACS but many other  chronic and acute conditions are known to elevate hsTnI results.  Refer to the "Links" section for chest pain algorithms and additional  guidance. Performed at [facility], [address] [place], [place] [address]   Troponin I (High Sensitivity)     Status: None   Collection Time: 06/22/23  2:51 PM  Result Value Ref Range   Troponin I (High Sensitivity) 15 <18 ng/L    Comment: (NOTE) Elevated high sensitivity troponin I (hsTnI) values and significant  changes across serial measurements may suggest ACS but many other  chronic and acute conditions are known to elevate hsTnI results.  Refer to the "Links" section for chest pain algorithms and additional  guidance. Performed at [facility], [address] [place], [place] [address]   Glucose, capillary     Status: Abnormal   Collection Time: 06/22/23  4:50 PM  Result Value Ref Range   Glucose-Capillary 100 (H) 70 - 99 mg/dL    Comment: Glucose reference range applies only to samples taken after fasting for at least 8 hours.  CBC     Status: Abnormal   Collection Time: 06/23/23  3:31 AM  Result Value Ref Range   WBC 7.8 4.0 - 10.5 K/uL   RBC 3.36 (L) 4.22 - 5.81 MIL/uL   Hemoglobin 10.4 (L) 13.0 - 17.0 g/dL   HCT 29.5 (L) 62.1 - 30.8 %   MCV 94.9 80.0 - 100.0 fL   MCH 31.0 26.0 - 34.0 pg   MCHC 32.6 30.0 - 36.0 g/dL   RDW 65.7 84.6 - 96.2 %   Platelets 174 150 - 400 K/uL   nRBC 0.0 0.0 - 0.2 %    Comment: Performed at [facility], [address] [place], [place] [address]  Basic metabolic panel     Status: Abnormal   Collection Time: 06/23/23  3:31 AM  Result Value Ref Range   Sodium 137 135 - 145 mmol/L   Potassium 3.9 3.5 - 5.1 mmol/L   Chloride 109 98 - 111 mmol/L   CO2 21 (L) 22 - 32 mmol/L   Glucose, Bld 149 (H) 70 - 99 mg/dL    Comment: Glucose reference range applies only to samples taken after fasting for at least 8 hours.   BUN 34 (H) 8 - 23 mg/dL   Creatinine, Ser 1.32 (H) 0.61 - 1.24 mg/dL   Calcium 7.6 (L) 8.9 - 10.3 mg/dL   GFR, Estimated 37 (L) >60 mL/min    Comment: (NOTE) Calculated using the CKD-EPI Creatinine Equation (2021)    Anion gap 7 5 - 15    Comment: Performed at [facility], [address] [place], [place] [address]  Sedimentation rate     Status: Abnormal   Collection Time: 06/23/23  3:31 AM  Result Value Ref Range   Sed Rate 44 (H) 0 - 20 mm/hr    Comment: Performed at [facility], [address] [place], [place] [address]  C-reactive protein     Status: Abnormal   Collection Time: 06/23/23  3:31 AM  Result Value Ref Range   CRP 15.8 (H) <1.0 mg/dL    Comment: Performed at [facility], [address], [place], [place] [address]  Lipoprotein A (LPA)     Status: Abnormal   Collection Time: 06/23/23  3:31 AM  Result Value Ref Range   Lipoprotein (a) 126.8 (H) <75.0 nmol/L    Comment: (NOTE) Note:  Values greater than or equal to 75.0 nmol/L may       indicate an independent risk factor for CHD,       but must be evaluated with caution when applied       to non-Caucasian populations due to the       influence of genetic factors on Lp(a) across       ethnicities. Performed At: [facility] [address] [place], [place] [id number] Jolene Schimke MD [id number]   APTT     Status: Abnormal   Collection Time: 06/23/23  3:31 AM  Result Value Ref Range   aPTT 66 (H) 24 - 36 seconds    Comment:        IF BASELINE aPTT  IS ELEVATED, SUGGEST PATIENT RISK ASSESSMENT BE USED TO DETERMINE APPROPRIATE ANTICOAGULANT THERAPY. Performed at [facility], [address] [place], [place] [address]   Glucose, capillary     Status: Abnormal   Collection Time: 06/23/23  7:45 AM  Result Value Ref Range   Glucose-Capillary 115 (H) 70 - 99 mg/dL    Comment: Glucose reference range applies only to samples taken after fasting for at least 8 hours.  Glucose, capillary     Status: Abnormal   Collection Time: 06/23/23 11:32 AM  Result Value Ref Range   Glucose-Capillary 106 (H) 70 - 99 mg/dL    Comment: Glucose reference range applies only to samples taken after fasting for at least 8 hours.  APTT     Status: Abnormal   Collection Time: 06/23/23 11:39 AM  Result Value Ref Range   aPTT 76 (H) 24 - 36 seconds    Comment:        IF BASELINE aPTT IS ELEVATED, SUGGEST PATIENT RISK ASSESSMENT BE USED TO DETERMINE APPROPRIATE ANTICOAGULANT THERAPY. Performed at [facility], [address] [place], [place] [address]   Heparin level (unfractionated)     Status: Abnormal   Collection Time: 06/23/23 11:39 AM  Result Value Ref Range   Heparin Unfractionated >1.10 (H) 0.30 - 0.70 IU/mL    Comment: (NOTE) The clinical reportable range upper limit is being lowered to >1.10 to align with the FDA approved guidance for the current laboratory assay.  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 Performed at [facility], [address] [place], [place] [address]   ECHOCARDIOGRAM COMPLETE     Status: None   Collection Time: 06/23/23  2:15 PM  Result Value Ref Range   Weight 3,481.5 oz   Height 69 in   BP 118/75 mmHg   Ao pk vel 1.58 m/s   AV Area VTI 2.77 cm2   AR max vel 2.37 cm2   AV Mean grad 4.0 mmHg   AV Peak grad 10.0 mmHg   S' Lateral 3.70 cm   AV Area mean vel 2.35 cm2   Area-P 1/2  3.33 cm2   MV VTI 2.06 cm2   Est EF 60 - 65%   Glucose, capillary     Status: None   Collection Time: 06/23/23  4:00 PM  Result Value Ref Range   Glucose-Capillary 97 70 - 99 mg/dL    Comment: Glucose reference range applies only to samples taken after fasting for at least 8 hours.  APTT  Status: Abnormal   Collection Time: 06/24/23  3:53 AM  Result Value Ref Range   aPTT 77 (H) 24 - 36 seconds    Comment:        IF BASELINE aPTT IS ELEVATED, SUGGEST PATIENT RISK ASSESSMENT BE USED TO DETERMINE APPROPRIATE ANTICOAGULANT THERAPY. Performed at [facility], [address] [place], [place] [address]   Heparin level (unfractionated)     Status: None   Collection Time: 06/24/23  3:53 AM  Result Value Ref Range   Heparin Unfractionated 0.70 0.30 - 0.70 IU/mL    Comment: (NOTE) The clinical reportable range upper limit is being lowered to >1.10 to align with the FDA approved guidance for the current laboratory assay.  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 Performed at [facility], [address] [place], [place] [address]   CBC     Status: Abnormal   Collection Time: 06/24/23  3:53 AM  Result Value Ref Range   WBC 6.1 4.0 - 10.5 K/uL   RBC 3.43 (L) 4.22 - 5.81 MIL/uL   Hemoglobin 10.6 (L) 13.0 - 17.0 g/dL   HCT 13.0 (L) 86.5 - 78.4 %   MCV 95.3 80.0 - 100.0 fL   MCH 30.9 26.0 - 34.0 pg   MCHC 32.4 30.0 - 36.0 g/dL   RDW 69.6 29.5 - 28.4 %   Platelets 169 150 - 400 K/uL   nRBC 0.0 0.0 - 0.2 %    Comment: Performed at [facility], [address] [place], [place] [address]  Basic metabolic panel     Status: Abnormal   Collection Time: 06/24/23  3:53 AM  Result Value Ref Range   Sodium 134 (L) 135 - 145 mmol/L   Potassium 3.6 3.5 - 5.1 mmol/L   Chloride 104 98 - 111 mmol/L   CO2 19 (L) 22 - 32 mmol/L   Glucose, Bld 113 (H) 70 - 99 mg/dL     Comment: Glucose reference range applies only to samples taken after fasting for at least 8 hours.   BUN 35 (H) 8 - 23 mg/dL   Creatinine, Ser 0.10 (H) 0.61 - 1.24 mg/dL   Calcium 7.9 (L) 8.9 - 10.3 mg/dL   GFR, Estimated 42 (L) >60 mL/min    Comment: (NOTE) Calculated using the CKD-EPI Creatinine Equation (2021)    Anion gap 11 5 - 15    Comment: Performed at [facility], [address] [place], [place] [address]  Magnesium     Status: None   Collection Time: 06/24/23  3:53 AM  Result Value Ref Range   Magnesium 2.0 1.7 - 2.4 mg/dL    Comment: Performed at [facility], [address] [place], [place] [address]  Basic metabolic panel     Status: Abnormal   Collection Time: 06/25/23  3:26 AM  Result Value Ref Range   Sodium 139 135 - 145 mmol/L   Potassium 4.2 3.5 - 5.1 mmol/L   Chloride 109 98 - 111 mmol/L   CO2 23 22 - 32 mmol/L   Glucose, Bld 117 (H) 70 - 99 mg/dL    Comment: Glucose reference range applies only to samples taken after fasting for at least 8 hours.   BUN 29 (H) 8 - 23 mg/dL   Creatinine, Ser 3.47 (H) 0.61 - 1.24 mg/dL   Calcium 8.6 (L) 8.9 - 10.3 mg/dL   GFR,  Estimated 47 (L) >60 mL/min    Comment: (NOTE) Calculated using the CKD-EPI Creatinine Equation (2021)    Anion gap 7 5 - 15    Comment: Performed at [facility], [address] [place], [place] [address]  Magnesium     Status: None   Collection Time: 06/25/23  3:26 AM  Result Value Ref Range   Magnesium 2.1 1.7 - 2.4 mg/dL    Comment: Performed at [facility], [address] [place], [place] [address]  APTT     Status: Abnormal   Collection Time: 06/25/23  3:26 AM  Result Value Ref Range   aPTT 64 (H) 24 - 36 seconds    Comment:        IF BASELINE aPTT IS ELEVATED, SUGGEST PATIENT RISK ASSESSMENT BE USED TO DETERMINE APPROPRIATE ANTICOAGULANT THERAPY. Performed at [facility], [address] [place], [place] [address]   CBC      Status: Abnormal   Collection Time: 06/25/23  3:26 AM  Result Value Ref Range   WBC 6.1 4.0 - 10.5 K/uL   RBC 3.67 (L) 4.22 - 5.81 MIL/uL   Hemoglobin 11.2 (L) 13.0 - 17.0 g/dL   HCT 96.2 (L) 95.2 - 84.1 %   MCV 92.6 80.0 - 100.0 fL   MCH 30.5 26.0 - 34.0 pg   MCHC 32.9 30.0 - 36.0 g/dL   RDW 32.4 40.1 - 02.7 %   Platelets 211 150 - 400 K/uL   nRBC 0.0 0.0 - 0.2 %    Comment: Performed at [facility], [address] [place] [address] [place], [place] [address]  Heparin level (unfractionated)     Status: None   Collection Time: 06/25/23  3:26 AM  Result Value Ref Range   Heparin Unfractionated 0.39 0.30 - 0.70 IU/mL    Comment: (NOTE) The clinical reportable range upper limit is being lowered to >1.10 to align with the FDA approved guidance for the current laboratory assay.  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 Performed at [facility], [address] [place], [place] [address]   APTT     Status: Abnormal   Collection Time: 06/25/23 12:44 PM  Result Value Ref Range   aPTT 99 (H) 24 - 36 seconds    Comment:        IF BASELINE aPTT IS ELEVATED, SUGGEST PATIENT RISK ASSESSMENT BE USED TO DETERMINE APPROPRIATE ANTICOAGULANT THERAPY. Performed at [facility], [address] [place], [place] [address]   POCT Activated clotting time     Status: None   Collection Time: 06/25/23  3:11 PM  Result Value Ref Range   Activated Clotting Time 273 seconds    Comment: Reference range 74-137 seconds for patients not on anticoagulant therapy.  POCT Activated clotting time     Status: None   Collection Time: 06/25/23  3:27 PM  Result Value Ref Range   Activated Clotting Time 250 seconds    Comment: Reference range 74-137 seconds for patients not on anticoagulant therapy.  Basic metabolic panel     Status: Abnormal   Collection Time: 06/26/23  5:34 AM  Result Value Ref  Range   Sodium 140 135 - 145 mmol/L   Potassium 3.9 3.5 - 5.1 mmol/L   Chloride 110 98 - 111 mmol/L   CO2 21 (L) 22 - 32 mmol/L   Glucose, Bld 105 (H) 70 - 99 mg/dL  Comment: Glucose reference range applies only to samples taken after fasting for at least 8 hours.   BUN 20 8 - 23 mg/dL   Creatinine, Ser 4.09 (H) 0.61 - 1.24 mg/dL   Calcium 8.5 (L) 8.9 - 10.3 mg/dL   GFR, Estimated 49 (L) >60 mL/min    Comment: (NOTE) Calculated using the CKD-EPI Creatinine Equation (2021)    Anion gap 9 5 - 15    Comment: Performed at [facility], [address] [place], [place] [address]  Magnesium     Status: None   Collection Time: 06/26/23  5:34 AM  Result Value Ref Range   Magnesium 2.0 1.7 - 2.4 mg/dL    Comment: Performed at [facility], [address] [place], [place] [address]  CBC     Status: Abnormal   Collection Time: 06/26/23  5:34 AM  Result Value Ref Range   WBC 5.6 4.0 - 10.5 K/uL   RBC 3.73 (L) 4.22 - 5.81 MIL/uL   Hemoglobin 11.4 (L) 13.0 - 17.0 g/dL   HCT 56.2 (L) 13.0 - 86.5 %   MCV 92.0 80.0 - 100.0 fL   MCH 30.6 26.0 - 34.0 pg   MCHC 33.2 30.0 - 36.0 g/dL   RDW 78.4 69.6 - 29.5 %   Platelets 216 150 - 400 K/uL   nRBC 0.0 0.0 - 0.2 %    Comment: Performed at [facility], [address] [place], [place] [address]  Heparin level (unfractionated)     Status: None   Collection Time: 06/26/23  5:34 AM  Result Value Ref Range   Heparin Unfractionated 0.44 0.30 - 0.70 IU/mL    Comment: (NOTE) The clinical reportable range upper limit is being lowered to >1.10 to align with the FDA approved guidance for the current laboratory assay.  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 Performed at [facility], [address] [place], [place] [address]   APTT     Status: Abnormal   Collection Time: 06/26/23  5:34 AM  Result Value Ref  Range   aPTT 84 (H) 24 - 36 seconds    Comment:        IF BASELINE aPTT IS ELEVATED, SUGGEST PATIENT RISK ASSESSMENT BE USED TO DETERMINE APPROPRIATE ANTICOAGULANT THERAPY. Performed at [facility], [address] [place], [place] [address]     Radiology VAS [place] UPPER EXTREMITY ARTERIAL DUPLEX Result Date: 07/21/2023  UPPER EXTREMITY DUPLEX STUDY Patient Name:  COULTON SCHLINK  Date of Exam:   07/19/2023 Medical Rec #: [id number]              Accession #:    [id number] Date of Birth: December 19, 1933              Patient Gender: M Patient Age:   10 years Exam Location:  [place] Procedure:      VAS [place] UPPER EXTREMITY ARTERIAL DUPLEX Referring Phys: CHRISTOPHER BERGE --------------------------------------------------------------------------------  Indications: Palpable knot. History:     Patient has a history of catheterization via right radial artery.  Risk Factors: Hypertension, hyperlipidemia, past history of smoking, prior CVA. Comparison Study: No previous Performing Technologist: Quentin Ore RDMS, RVT, RDCS  Examination Guidelines: A complete evaluation includes B-mode imaging, spectral Doppler, color Doppler, and power Doppler as needed of all accessible portions of each vessel. Bilateral testing is considered an integral part of a complete examination. Limited examinations  for reoccurring indications may be performed as noted.  Right Doppler Findings: +---------------+----------+---------+--------+--------+ Site           PSV (cm/s)Waveform StenosisComments +---------------+----------+---------+--------+--------+ Subclavian Dist          triphasic                 +---------------+----------+---------+--------+--------+ Radial Prox    137       biphasic                  +---------------+----------+---------+--------+--------+ Radial Mid     136       biphasic                  +---------------+----------+---------+--------+--------+ Radial Dist    135        triphasic                 +---------------+----------+---------+--------+--------+ Cavity at pulsatile puncture site meas, 1.0 x2.0 cm. Positive of "yin-yang" bloodflow Pseudo neck meas up to 0.222 cm  An area with well defined borders measuring 1.0 cm x 2.0 cm was visualized arising off of the distal right radial art with ultrasound characteristics of a pseudoaneurysm. The neck measures approximately 0.2 cm wide and 0.5 cm long.  Suggest Peripheral Vascular Consult. Electronically signed by Delrae Rend on 07/21/2023 at 6:25:17 AM.    Final    VAS US CAROTID Result Date: 07/03/2023 Carotid Arterial Duplex Study Patient Name:  HAWLEY MICHEL  Date of Exam:   07/02/2023 Medical Rec #: [id number]              Accession #:    [id number] Date of Birth: 22-Feb-1934              Patient Gender: M Patient Age:   85 years Exam Location:  [facility] Procedure:      VAS US CAROTID Referring Phys: Festus Barren --------------------------------------------------------------------------------  Indications:  Carotid artery disease. Risk Factors: Past history of smoking, prior MI, coronary artery disease. Performing Technologist: Hardie Lora RVT  Examination Guidelines: A complete evaluation includes B-mode imaging, spectral Doppler, color Doppler, and power Doppler as needed of all accessible portions of each vessel. Bilateral testing is considered an integral part of a complete examination. Limited examinations for reoccurring indications may be performed as noted.  Right Carotid Findings: +----------+--------+--------+--------+------------------+--------+           PSV cm/sEDV cm/sStenosisPlaque DescriptionComments +----------+--------+--------+--------+------------------+--------+ CCA Prox  94      16                                         +----------+--------+--------+--------+------------------+--------+ CCA Mid   71      15                                          +----------+--------+--------+--------+------------------+--------+ CCA Distal66      13      <50%    smooth                     +----------+--------+--------+--------+------------------+--------+ ICA Prox  58      17      1-39%   smooth                     +----------+--------+--------+--------+------------------+--------+ ICA Mid  107     27              smooth                     +----------+--------+--------+--------+------------------+--------+ ICA Distal92      31                                         +----------+--------+--------+--------+------------------+--------+ ECA       95      0                                          +----------+--------+--------+--------+------------------+--------+ +----------+--------+-------+----------------+-------------------+           PSV cm/sEDV cmsDescribe        Arm Pressure (mmHG) +----------+--------+-------+----------------+-------------------+ [id number]             Multiphasic, WNL                    +----------+--------+-------+----------------+-------------------+ +---------+--------+--+--------+-+---------+ VertebralPSV cm/s34EDV cm/s8Antegrade +---------+--------+--+--------+-+---------+  Left Carotid Findings: +----------+--------+--------+--------+-----------------------+--------+           PSV cm/sEDV cm/sStenosisPlaque Description     Comments +----------+--------+--------+--------+-----------------------+--------+ CCA Prox  99      17                                              +----------+--------+--------+--------+-----------------------+--------+ CCA Mid   82      21                                              +----------+--------+--------+--------+-----------------------+--------+ CCA Distal58      16                                              +----------+--------+--------+--------+-----------------------+--------+ ICA Prox  48      14      1-39%   smooth and  heterogenous         +----------+--------+--------+--------+-----------------------+--------+ ICA Mid   88      32                                              +----------+--------+--------+--------+-----------------------+--------+ ICA Distal89      25                                              +----------+--------+--------+--------+-----------------------+--------+ ECA       82      0                                               +----------+--------+--------+--------+-----------------------+--------+ +----------+--------+--------+----------------+-------------------+  PSV cm/sEDV cm/sDescribe        Arm Pressure (mmHG) +----------+--------+--------+----------------+-------------------+ [id number]              Multiphasic, WNL                    +----------+--------+--------+----------------+-------------------+ +---------+--------+--+--------+-+---------+ VertebralPSV cm/s34EDV cm/s9Antegrade +---------+--------+--+--------+-+---------+   Summary: Right Carotid: Velocities in the right ICA are consistent with a 1-39% stenosis.                Non-hemodynamically significant plaque <50% noted in the CCA. Left Carotid: Velocities in the left ICA are consistent with a 1-39% stenosis. Vertebrals:  Bilateral vertebral arteries demonstrate antegrade flow. Subclavians: Normal flow hemodynamics were seen in bilateral subclavian              arteries. *See table(s) above for measurements and observations.  Electronically signed by Festus Barren MD on 07/03/2023 at 2:12:20 PM.    Final    VAS [place] LOWER EXTREMITY VENOUS REFLUX Result Date: 07/03/2023  Lower Venous Reflux Study Patient Name:  ILYA NEELY  Date of Exam:   07/02/2023 Medical Rec #: [id number]              Accession #:    [id number] Date of Birth: 1933-07-16              Patient Gender: M Patient Age:   23 years Exam Location:  [facility] Procedure:      VAS [place] LOWER EXTREMITY VENOUS REFLUX  Referring Phys: Festus Barren --------------------------------------------------------------------------------  Indications: Pain, Swelling, and Edema.  Performing Technologist: Hardie Lora RVT  Examination Guidelines: A complete evaluation includes B-mode imaging, spectral Doppler, color Doppler, and power Doppler as needed of all accessible portions of each vessel. Bilateral testing is considered an integral part of a complete examination. Limited examinations for reoccurring indications may be performed as noted. The reflux portion of the exam is performed with the patient in reverse Trendelenburg. Significant venous reflux is defined as >500 ms in the superficial venous system, and >1 second in the deep venous system.  Venous Reflux Times +--------------+---------+------+-----------+------------+--------+ RIGHT         Reflux NoRefluxReflux TimeDiameter cmsComments                         Yes                                  +--------------+---------+------+-----------+------------+--------+ CFV           no                                             +--------------+---------+------+-----------+------------+--------+ FV prox       no                                             +--------------+---------+------+-----------+------------+--------+ FV mid        no                                             +--------------+---------+------+-----------+------------+--------+  FV dist       no                                             +--------------+---------+------+-----------+------------+--------+ Popliteal     no                                             +--------------+---------+------+-----------+------------+--------+ GSV at SFJ              yes    >500 ms      0.49             +--------------+---------+------+-----------+------------+--------+ GSV prox thigh          yes    >500 ms      0.52              +--------------+---------+------+-----------+------------+--------+ GSV mid thigh           yes    >500 ms      0.50             +--------------+---------+------+-----------+------------+--------+ GSV dist thigh          yes    >500 ms      0.43             +--------------+---------+------+-----------+------------+--------+ GSV at knee             yes    >500 ms      0.64             +--------------+---------+------+-----------+------------+--------+ GSV prox calf           yes    >500 ms      0.52             +--------------+---------+------+-----------+------------+--------+ SSV Pop Fossa no                            0.33             +--------------+---------+------+-----------+------------+--------+ SSV prox calf no                            0.21             +--------------+---------+------+-----------+------------+--------+ SSV mid calf  no                            0.19             +--------------+---------+------+-----------+------------+--------+  +--------------+---------+------+-----------+------------+--------+ LEFT          Reflux NoRefluxReflux TimeDiameter cmsComments                         Yes                                  +--------------+---------+------+-----------+------------+--------+ CFV                     yes                                  +--------------+---------+------+-----------+------------+--------+  FV prox       no                                             +--------------+---------+------+-----------+------------+--------+ FV mid        no                                             +--------------+---------+------+-----------+------------+--------+ FV dist       no                                             +--------------+---------+------+-----------+------------+--------+ Popliteal     no                                              +--------------+---------+------+-----------+------------+--------+ GSV at SFJ              yes    >500 ms      0.65             +--------------+---------+------+-----------+------------+--------+ GSV prox thigh          yes    >500 ms      0.40             +--------------+---------+------+-----------+------------+--------+ GSV mid thigh           yes    >500 ms      0.51             +--------------+---------+------+-----------+------------+--------+ GSV dist thighno                            0.37             +--------------+---------+------+-----------+------------+--------+ GSV at knee   no                            0.34             +--------------+---------+------+-----------+------------+--------+ GSV prox calf           yes    >500 ms      0.37             +--------------+---------+------+-----------+------------+--------+ SSV Pop Fossa no                            0.44             +--------------+---------+------+-----------+------------+--------+ SSV prox calf no                            0.27             +--------------+---------+------+-----------+------------+--------+ SSV mid calf  no                            0.17             +--------------+---------+------+-----------+------------+--------+   Summary: Right: -  No evidence of deep vein thrombosis seen in the right lower extremity, from the common femoral through the popliteal veins. - No evidence of superficial venous thrombosis in the right lower extremity. - No evidence of superficial venous reflux seen in the right short saphenous vein. - Venous reflux is noted in the right sapheno-femoral junction. - Venous reflux is noted in the right greater saphenous vein in the thigh. - Venous reflux is noted in the right greater saphenous vein in the calf.  Left: - No evidence of deep vein thrombosis seen in the left lower extremity, from the common femoral through the popliteal veins. - No  evidence of superficial venous reflux seen in the left short saphenous vein. - Venous reflux is noted in the left sapheno-femoral junction. - Venous reflux is noted in the left greater saphenous vein in the thigh. - Venous reflux is noted in the left greater saphenous vein in the calf. - SSV is non compressible consistent with chronic thrombus.  *See table(s) above for measurements and observations. Electronically signed by Festus Barren MD on 07/03/2023 at 2:12:13 PM.    Final     Assessment/Plan  Pseudoaneurysm of vascular access site 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  We discussed with the size and enlargement, this is unlikely to be something that spontaneously resolves.  We discussed options for treatment.  Although coil embolization of the radial artery would be a reasonable option for treatment of this, the large lump would persist for some time and may never completely go away.  He appears to have good ulnar artery flow and no ischemic symptoms to his hand even with compression of the radial artery.  I believe a direct surgical repair of this would be the most appropriate and likely ligation of his radial artery both proximal and distal will be performed.  This will allow removal of the large aneurysms well.  I discussed the risk and benefits of the procedure.  The patient voices his understanding and is agreeable to proceed.  Carotid stenosis Carotid duplex earlier this month demonstrates stable 1 to 39% ICA stenosis bilaterally.  No role for intervention at this level.  He is on Plavix, Eliquis, and Lipitor.  This may be more for his cardiac disease and his carotid disease, but this would be (more than) adequate therapy for his carotid disease as well.  Follow-up in 1 year with carotid duplex.  Benign hypertension blood pressure control important in reducing the progression of atherosclerotic disease. On appropriate oral medications.      Prediabetes blood glucose control important in reducing the progression of atherosclerotic disease. Also, involved in wound healing. Not on medications.     Hyperlipidemia lipid control important in reducing the progression of atherosclerotic disease. Continue statin therapy  Varicose veins of leg with edema, right A reflux study was performed earlier this month demonstrating a long segment reflux in the right great saphenous vein throughout its course including the saphenofemoral junction.  The left great saphenous vein also has significant reflux throughout most of its course including the saphenofemoral junction.  Laser ablation insurance approval is pending.  Festus Barren, MD  07/26/2023 10:09 AM    This note was created with Dragon medical transcription system.  Any errors from dictation are purely unintentional

## 2023-07-26 NOTE — Telephone Encounter (Signed)
Spoke with the patient and he is scheduled with Dr. Wyn Quaker on 07/29/23 for a right radial artery aneurysm repair at the [facility]. Pre-op is a 2 hour early report to [facility] at 10:30 am. Pre-surgical instructions were discussed along with stopping his Xarelto today as well. This will be sent to Mychart.

## 2023-07-26 NOTE — Patient Instructions (Addendum)
Your procedure is scheduled on:   Monday February 3  Report to the Registration Desk on the 1st floor of the [facility] To find out your arrival time, please call [phone] between 1PM - 3PM on:  Friday  January 31 If your arrival time is 6:00 am, do not arrive before that time as the [facility] entrance doors do not open until 6:00 am.  REMEMBER: Instructions that are not followed completely may result in serious medical risk, up to and including death; or upon the discretion of your surgeon and anesthesiologist your surgery may need to be rescheduled.  Do not eat food after midnight the night before surgery.  No gum chewing or hard candies.  One week prior to surgery: Stop Anti-inflammatories (NSAIDS) such as Advil, Aleve, Ibuprofen, Motrin, Naproxen, Naprosyn and Aspirin based products such as Excedrin, Goody's Powder, BC Powder. Stop ANY OVER THE COUNTER supplements until after surgery.  You may however, continue to take Tylenol if needed for pain up until the day of surgery.  **Follow recommendations regarding stopping blood thinners.** clopidogrel (PLAVIX) continue until the day of surgery Rivaroxaban (XARELTO) hold 2 days prior to surgery, last dose Friday January 31  Continue taking all of your other prescription medications up until the day of surgery.  ON THE DAY OF SURGERY ONLY TAKE THESE MEDICATIONS WITH SIPS OF WATER:  metoprolol succinate (TOPROL XL)  atorvastatin   No Alcohol for 24 hours before or after surgery.  No Smoking including e-cigarettes for 24 hours before surgery.  No chewable tobacco products for at least 6 hours before surgery.  No nicotine patches on the day of surgery.  Do not use any "recreational" drugs for at least a week (preferably 2 weeks) before your surgery.  Please be advised that the combination of cocaine and anesthesia may have negative outcomes, up to and including death. If you test positive for cocaine, your surgery will be  cancelled.  On the morning of surgery brush your teeth with toothpaste and water, you may rinse your mouth with mouthwash if you wish. Do not swallow any toothpaste or mouthwash.  Use CHG Soap as directed on instruction sheet.  Do not wear jewelry, make-up, hairpins, clips or nail polish.  For welded (permanent) jewelry: bracelets, anklets, waist bands, etc.  Please have this removed prior to surgery.  If it is not removed, there is a chance that hospital personnel will need to cut it off on the day of surgery.  Do not wear lotions, powders, or perfumes.   Do not shave body hair from the neck down 48 hours before surgery.  Contact lenses, hearing aids and dentures may not be worn into surgery.  Do not bring valuables to the hospital. [facility] is not responsible for any missing/lost belongings or valuables.   Notify your doctor if there is any change in your medical condition (cold, fever, infection).  Wear comfortable clothing (specific to your surgery type) to the hospital.  After surgery, you can help prevent lung complications by doing breathing exercises.  Take deep breaths and cough every 1-2 hours  If you are being discharged the day of surgery, you will not be allowed to drive home. You will need a responsible individual to drive you home and stay with you for 24 hours after surgery.   If you are taking public transportation, you will need to have a responsible individual with you.  Please call the Pre-admissions Testing Dept. at [phone] if you have any  questions about these instructions.  Surgery Visitation Policy:  Patients having surgery or a procedure may have two visitors.  Children under the age of 63 must have an adult with them who is not the patient.  Temporary Visitor Restrictions Due to increasing cases of flu, RSV and COVID-19: Children ages 38 and under will not be able to visit patients in [facility] hospitals under most  circumstances.          Preparing for Surgery with CHLORHEXIDINE GLUCONATE (CHG) Soap  Chlorhexidine Gluconate (CHG) Soap  o An antiseptic cleaner that kills germs and bonds with the skin to continue killing germs even after washing  o Used for showering the night before surgery and morning of surgery  Before surgery, you can play an important role by reducing the number of germs on your skin.  CHG (Chlorhexidine gluconate) soap is an antiseptic cleanser which kills germs and bonds with the skin to continue killing germs even after washing.  Please do not use if you have an allergy to CHG or antibacterial soaps. If your skin becomes reddened/irritated stop using the CHG.  1. Shower the NIGHT BEFORE SURGERY and the MORNING OF SURGERY with CHG soap.  2. If you choose to wash your hair, wash your hair first as usual with your normal shampoo.  3. After shampooing, rinse your hair and body thoroughly to remove the shampoo.  4. Use CHG as you would any other liquid soap. You can apply CHG directly to the skin and wash gently with a scrungie or a clean washcloth.  5. Apply the CHG soap to your body only from the neck down. Do not use on open wounds or open sores. Avoid contact with your eyes, ears, mouth, and genitals (private parts). Wash face and genitals (private parts) with your normal soap.  6. Wash thoroughly, paying special attention to the area where your surgery will be performed.  7. Thoroughly rinse your body with warm water.  8. Do not shower/wash with your normal soap after using and rinsing off the CHG soap.  9. Pat yourself dry with a clean towel.  10. Wear clean pajamas to bed the night before surgery.  12. Place clean sheets on your bed the night of your first shower and do not sleep with pets.  13. Shower again with the CHG soap on the day of surgery prior to arriving at the hospital.  14. Do not apply any deodorants/lotions/powders.  15. Please wear clean  clothes to the hospital.

## 2023-07-26 NOTE — Assessment & Plan Note (Addendum)
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  We discussed with the size and enlargement, this is unlikely to be something that spontaneously resolves.  We discussed options for treatment.  Although coil embolization of the radial artery would be a reasonable option for treatment of this, the large lump would persist for some time and may never completely go away.  He appears to have good ulnar artery flow and no ischemic symptoms to his hand even with compression of the radial artery.  I believe a direct surgical repair of this would be the most appropriate and likely ligation of his radial artery both proximal and distal will be performed.  This will allow removal of the large aneurysms well.  I discussed the risk and benefits of the procedure.  The patient voices his understanding and is agreeable to proceed.

## 2023-07-26 NOTE — H&P (View-Only) (Signed)
MRN : [id number]  Jonathan [name] is a 89 y.o. (February 16, 1934) male who presents with chief complaint of  Chief Complaint  Patient presents with   Follow-up    Consult right wrist aneurysm   .  History of Present Illness: Patient returns today in follow up prior to his scheduled visit for carotid disease and venous disease for a new right radial artery pseudoaneurysm.  This occurred after a cardiac catheterization from the right wrist earlier this month.  This has had a steadily enlarging bulge and is now 2 to 3 cm in diameter.  A duplex was done at his cardiologist office which showed a widemouth pseudoaneurysm of the right radial artery.  He is really having no pain.  He does not have any ischemic symptoms to his hand.  He is doing well from a cardiac standpoint after his intervention.  Current Outpatient Medications  Medication Sig Dispense Refill   acetaminophen (TYLENOL) 500 MG tablet Take 1 tablet (500 mg total) by mouth every 6 (six) hours as needed. 30 tablet 0   atorvastatin (LIPITOR) 80 MG tablet Take 1 tablet (80 mg total) by mouth daily. 90 tablet 1   Cholecalciferol (VITAMIN D) 2000 units CAPS Take 1 capsule (2,000 Units total) by mouth daily. 30 capsule    clopidogrel (PLAVIX) 75 MG tablet Take 1 tablet (75 mg total) by mouth daily. 90 tablet 1   colchicine 0.6 MG tablet Take 1 tablet (0.6 mg total) by mouth daily. 90 tablet 0   Cyanocobalamin (B-12) 500 MCG TABS Take 1 tablet by mouth daily. 150 tablet    lisinopril (ZESTRIL) 20 MG tablet Take 1 tablet (20 mg total) by mouth daily. 90 tablet 3   metoprolol succinate (TOPROL XL) 25 MG 24 hr tablet Take 1 tablet (25 mg total) by mouth daily. 90 tablet 3   Rivaroxaban (XARELTO) 15 MG TABS tablet Take 1 tablet (15 mg total) by mouth daily with supper. 90 tablet 1   Triamcinolone Acetonide (NASACORT ALLERGY 24HR NA) Place into the nose.     No current facility-administered medications for this visit.    Past Medical  History:  Diagnosis Date   Acute pericarditis    a. 05/2023-->colchicine x 3 mos.   CAD (coronary artery disease)    a. 05/2023 PCI: LM nl, LAD 80p (2.75x12 Onyx DES), LCX 44m, RCA 70ost/p.   Hypercholesteremia    Hypertension    Mitral regurgitation    a. 02/2023 Echo: EF 60-65%, no rwma, mild LVH, nl RV fxn, RVSP 39.10mmHg, mod dil LA, mod MR, mild-mod TR, AoV sclerosis.   Permanent atrial fibrillation (HCC)    a. CHA2DS2VASc = 6-->eliquis.   Pre-diabetes    Prostate cancer (HCC)    Stroke [facility])     Past Surgical History:  Procedure Laterality Date   CORONARY STENT INTERVENTION N/A 06/25/2023   Procedure: CORONARY STENT INTERVENTION;  Surgeon: Yvonne Kendall, MD;  Location: [facility] INVASIVE CV LAB;  Service: Cardiovascular;  Laterality: N/A;   CORONARY/GRAFT ACUTE MI REVASCULARIZATION N/A 06/22/2023   Procedure: Coronary/Graft Acute MI Revascularization;  Surgeon: Tonny Bollman, MD;  Location: [facility] INVASIVE CV LAB;  Service: Cardiovascular;  Laterality: N/A;   LEFT HEART CATH AND CORONARY ANGIOGRAPHY N/A 06/22/2023   Procedure: LEFT HEART CATH AND CORONARY ANGIOGRAPHY;  Surgeon: Tonny Bollman, MD;  Location: [facility] INVASIVE CV LAB;  Service: Cardiovascular;  Laterality: N/A;   LEFT HEART CATH AND CORONARY ANGIOGRAPHY N/A 06/25/2023   Procedure: LEFT HEART CATH AND CORONARY ANGIOGRAPHY;  Surgeon: Yvonne Kendall, MD;  Location: [facility] INVASIVE CV LAB;  Service: Cardiovascular;  Laterality: N/A;   PROSTATE SURGERY     SKIN BIOPSY     basel cell     Social History   Tobacco Use   Smoking status: Former    Types: Cigars    Quit date: 1994    Years since quitting: 31.1   Smokeless tobacco: Never  Vaping Use   Vaping status: Never Used  Substance Use Topics   Alcohol use: Yes    Comment: occasional/2 beers a month   Drug use: Never      Family History  Problem Relation Age of Onset   Heart disease Maternal Uncle    Heart disease Maternal Grandfather   No bleeding or  clotting disorders  Allergies  Allergen Reactions   Penicillin V Potassium Other (See Comments)   Penicillins Rash    REVIEW OF SYSTEMS (Negative unless checked)   Constitutional: [] Weight loss  [] Fever  [] Chills Cardiac: [] Chest pain   [] Chest pressure   [] Palpitations   [] Shortness of breath when laying flat   [] Shortness of breath at rest   [] Shortness of breath with exertion. Vascular:  [] Pain in legs with walking   [] Pain in legs at rest   [] Pain in legs when laying flat   [] Claudication   [] Pain in feet when walking  [] Pain in feet at rest  [] Pain in feet when laying flat   [] History of DVT   [] Phlebitis   [] Swelling in legs   [x] Varicose veins   [] Non-healing ulcers Pulmonary:   [] Uses home oxygen   [] Productive cough   [] Hemoptysis   [] Wheeze  [] COPD   [] Asthma Neurologic:  [] Dizziness  [] Blackouts   [] Seizures   [x] History of stroke   [] History of TIA  [] Aphasia   [] Temporary blindness   [] Dysphagia   [] Weakness or numbness in arms   [] Weakness or numbness in legs Musculoskeletal:  [x] Arthritis   [] Joint swelling   [x] Joint pain   [] Low back pain Hematologic:  [] Easy bruising  [] Easy bleeding   [] Hypercoagulable state   [] Anemic   Gastrointestinal:  [] Blood in stool   [] Vomiting blood  [] Gastroesophageal reflux/heartburn   [] Abdominal pain Genitourinary:  [] Chronic kidney disease   [] Difficult urination  [] Frequent urination  [] Burning with urination   [] Hematuria Skin:  [] Rashes   [] Ulcers   [] Wounds Psychological:  [] History of anxiety   []  History of major depression.  Physical Examination  BP 139/72   Pulse 72   Resp 16   Wt 198 lb 12.8 oz (90.2 kg)   BMI 29.36 kg/m  Gen:  WD/WN, NAD. Appears younger than stated age Head: [place]/AT, No temporalis wasting. Ear/Nose/Throat: Hearing grossly intact, nares w/o erythema or drainage Eyes: Conjunctiva clear. Sclera non-icteric Neck: Supple.  Trachea midline Pulmonary:  Good air movement, no use of accessory muscles.  Cardiac:  Bradycardic  Vascular:  Vessel Right Left  Radial Enlarged Palpable Palpable           Musculoskeletal: M/S 5/5 throughout.  No deformity or atrophy.  There is a 2 to 3 cm easily palpable right radial artery aneurysm present.  There is no immediate skin threat.  It is not particularly tender to palpation.  His hand is warm and well-perfused.  Even with compression of the radial artery, his hand remains pink with good capillary refill.  1+ bilateral lower extremity edema. Neurologic: Sensation grossly intact in extremities.  Symmetrical.  Speech is fluent.  Psychiatric: Judgment intact, Mood &  affect appropriate for pt's clinical situation. Dermatologic: No rashes or ulcers noted.  No cellulitis or open wounds.      Labs Recent Results (from the past 2160 hours)  Basic metabolic panel     Status: Abnormal   Collection Time: 06/22/23  8:57 AM  Result Value Ref Range   Sodium 138 135 - 145 mmol/L   Potassium 4.2 3.5 - 5.1 mmol/L   Chloride 107 98 - 111 mmol/L   CO2 22 22 - 32 mmol/L   Glucose, Bld 120 (H) 70 - 99 mg/dL    Comment: Glucose reference range applies only to samples taken after fasting for at least 8 hours.   BUN 33 (H) 8 - 23 mg/dL   Creatinine, Ser 1.61 (H) 0.61 - 1.24 mg/dL   Calcium 8.3 (L) 8.9 - 10.3 mg/dL   GFR, Estimated 43 (L) >60 mL/min    Comment: (NOTE) Calculated using the CKD-EPI Creatinine Equation (2021)    Anion gap 9 5 - 15    Comment: Performed at [facility], [address] [place], [place] [address]  CBC     Status: Abnormal   Collection Time: 06/22/23  8:57 AM  Result Value Ref Range   WBC 8.8 4.0 - 10.5 K/uL   RBC 3.77 (L) 4.22 - 5.81 MIL/uL   Hemoglobin 11.6 (L) 13.0 - 17.0 g/dL   HCT 54.0 (L) 98.1 - 19.1 %   MCV 95.8 80.0 - 100.0 fL   MCH 30.8 26.0 - 34.0 pg   MCHC 32.1 30.0 - 36.0 g/dL   RDW 47.8 29.5 - 62.1 %   Platelets 192 150 - 400 K/uL   nRBC 0.0 0.0 - 0.2 %    Comment: Performed at [facility], [address], [place], [place] [address]  Troponin I (High Sensitivity)     Status: None   Collection Time: 06/22/23  8:57 AM  Result Value Ref Range   Troponin I (High Sensitivity) 11 <18 ng/L    Comment: (NOTE) Elevated high sensitivity troponin I (hsTnI) values and significant  changes across serial measurements may suggest ACS but many other  chronic and acute conditions are known to elevate hsTnI results.  Refer to the "Links" section for chest pain algorithms and additional  guidance. Performed at [facility], [address] [place], [place] [address]   Hemoglobin A1c     Status: Abnormal   Collection Time: 06/22/23  9:40 AM  Result Value Ref Range   Hgb A1c MFr Bld 6.0 (H) 4.8 - 5.6 %    Comment: (NOTE)         Prediabetes: 5.7 - 6.4         Diabetes: >6.4         Glycemic control for adults with diabetes: <7.0    Mean Plasma Glucose 126 mg/dL    Comment: (NOTE) Performed At: [facility] [address] [place], [place] [id number] Jolene Schimke MD KG:[id number]   Protime-INR     Status: Abnormal   Collection Time: 06/22/23  9:40 AM  Result Value Ref Range   Prothrombin Time 21.0 (H) 11.4 - 15.2 seconds   INR 1.8 (H) 0.8 - 1.2    Comment: (NOTE) INR goal varies based on device and disease states. Performed at [facility], [address] [place], [place] [address]   APTT     Status: Abnormal   Collection Time: 06/22/23  9:40 AM  Result Value Ref Range   aPTT  50 (H) 24 - 36 seconds    Comment:        IF BASELINE aPTT IS ELEVATED, SUGGEST PATIENT RISK ASSESSMENT BE USED TO DETERMINE APPROPRIATE ANTICOAGULANT THERAPY. Performed at [facility], [address] [place], [place] [address]   Lipid panel     Status: None   Collection Time: 06/22/23  9:40 AM  Result Value Ref Range   Cholesterol 152 0 - 200 mg/dL   Triglycerides 56 <604 mg/dL   HDL 63 >54 mg/dL   Total CHOL/HDL Ratio 2.4 RATIO   VLDL 11 0 - 40 mg/dL   LDL  Cholesterol 78 0 - 99 mg/dL    Comment:        Total Cholesterol/HDL:CHD Risk Coronary Heart Disease Risk Table                     Men   Women  1/2 Average Risk   3.4   3.3  Average Risk       5.0   4.4  2 X Average Risk   9.6   7.1  3 X Average Risk  23.4   11.0        Use the calculated Patient Ratio above and the CHD Risk Table to determine the patient's CHD Risk.        ATP III CLASSIFICATION (LDL):  <100     mg/dL   Optimal  098-119  mg/dL   Near or Above                    Optimal  130-159  mg/dL   Borderline  147-829  mg/dL   High  >562     mg/dL   Very High Performed at [facility], [address] [place], [place] [address]   Heparin level (unfractionated)     Status: Abnormal   Collection Time: 06/22/23  9:40 AM  Result Value Ref Range   Heparin Unfractionated >1.10 (H) 0.30 - 0.70 IU/mL    Comment: (NOTE) The clinical reportable range upper limit is being lowered to >1.10 to align with the FDA approved guidance for the current laboratory assay.  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 Performed at [facility], [address] [place], [place] [address]   Glucose, capillary     Status: Abnormal   Collection Time: 06/22/23 11:30 AM  Result Value Ref Range   Glucose-Capillary 102 (H) 70 - 99 mg/dL    Comment: Glucose reference range applies only to samples taken after fasting for at least 8 hours.  MRSA Next Gen by PCR, Nasal     Status: None   Collection Time: 06/22/23 11:31 AM   Specimen: Nasal Mucosa; Nasal Swab  Result Value Ref Range   MRSA by PCR Next Gen NOT DETECTED NOT DETECTED    Comment: (NOTE) The GeneXpert MRSA Assay (FDA approved for NASAL specimens only), is one component of a comprehensive MRSA colonization surveillance program. It is not intended to diagnose MRSA infection nor to guide or monitor treatment for MRSA  infections. Test performance is not FDA approved in patients less than 60 years old. Performed at [facility], [address] [place], [place] [address]   Troponin I (High Sensitivity)     Status: None   Collection Time: 06/22/23 11:43 AM  Result Value Ref Range   Troponin I (High Sensitivity) 13 <  18 ng/L    Comment: (NOTE) Elevated high sensitivity troponin I (hsTnI) values and significant  changes across serial measurements may suggest ACS but many other  chronic and acute conditions are known to elevate hsTnI results.  Refer to the "Links" section for chest pain algorithms and additional  guidance. Performed at [facility], [address] [place], [place] [address]   Troponin I (High Sensitivity)     Status: None   Collection Time: 06/22/23  2:51 PM  Result Value Ref Range   Troponin I (High Sensitivity) 15 <18 ng/L    Comment: (NOTE) Elevated high sensitivity troponin I (hsTnI) values and significant  changes across serial measurements may suggest ACS but many other  chronic and acute conditions are known to elevate hsTnI results.  Refer to the "Links" section for chest pain algorithms and additional  guidance. Performed at [facility], [address] [place], [place] [address]   Glucose, capillary     Status: Abnormal   Collection Time: 06/22/23  4:50 PM  Result Value Ref Range   Glucose-Capillary 100 (H) 70 - 99 mg/dL    Comment: Glucose reference range applies only to samples taken after fasting for at least 8 hours.  CBC     Status: Abnormal   Collection Time: 06/23/23  3:31 AM  Result Value Ref Range   WBC 7.8 4.0 - 10.5 K/uL   RBC 3.36 (L) 4.22 - 5.81 MIL/uL   Hemoglobin 10.4 (L) 13.0 - 17.0 g/dL   HCT 29.5 (L) 62.1 - 30.8 %   MCV 94.9 80.0 - 100.0 fL   MCH 31.0 26.0 - 34.0 pg   MCHC 32.6 30.0 - 36.0 g/dL   RDW 65.7 84.6 - 96.2 %   Platelets 174 150 - 400 K/uL   nRBC 0.0 0.0 - 0.2 %    Comment: Performed at [facility], [address] [place], [place] [address]  Basic metabolic panel     Status: Abnormal   Collection Time: 06/23/23  3:31 AM  Result Value Ref Range   Sodium 137 135 - 145 mmol/L   Potassium 3.9 3.5 - 5.1 mmol/L   Chloride 109 98 - 111 mmol/L   CO2 21 (L) 22 - 32 mmol/L   Glucose, Bld 149 (H) 70 - 99 mg/dL    Comment: Glucose reference range applies only to samples taken after fasting for at least 8 hours.   BUN 34 (H) 8 - 23 mg/dL   Creatinine, Ser 1.32 (H) 0.61 - 1.24 mg/dL   Calcium 7.6 (L) 8.9 - 10.3 mg/dL   GFR, Estimated 37 (L) >60 mL/min    Comment: (NOTE) Calculated using the CKD-EPI Creatinine Equation (2021)    Anion gap 7 5 - 15    Comment: Performed at [facility], [address] [place], [place] [address]  Sedimentation rate     Status: Abnormal   Collection Time: 06/23/23  3:31 AM  Result Value Ref Range   Sed Rate 44 (H) 0 - 20 mm/hr    Comment: Performed at [facility], [address] [place], [place] [address]  C-reactive protein     Status: Abnormal   Collection Time: 06/23/23  3:31 AM  Result Value Ref Range   CRP 15.8 (H) <1.0 mg/dL    Comment: Performed at [facility], [address], [place], [place] [address]  Lipoprotein A (LPA)     Status: Abnormal   Collection Time: 06/23/23  3:31 AM  Result Value Ref Range   Lipoprotein (a) 126.8 (H) <75.0 nmol/L    Comment: (NOTE) Note:  Values greater than or equal to 75.0 nmol/L may       indicate an independent risk factor for CHD,       but must be evaluated with caution when applied       to non-Caucasian populations due to the       influence of genetic factors on Lp(a) across       ethnicities. Performed At: [facility] [address] [place], [place] [id number] Jolene Schimke MD [id number]   APTT     Status: Abnormal   Collection Time: 06/23/23  3:31 AM  Result Value Ref Range   aPTT 66 (H) 24 - 36 seconds    Comment:        IF BASELINE aPTT  IS ELEVATED, SUGGEST PATIENT RISK ASSESSMENT BE USED TO DETERMINE APPROPRIATE ANTICOAGULANT THERAPY. Performed at [facility], [address] [place], [place] [address]   Glucose, capillary     Status: Abnormal   Collection Time: 06/23/23  7:45 AM  Result Value Ref Range   Glucose-Capillary 115 (H) 70 - 99 mg/dL    Comment: Glucose reference range applies only to samples taken after fasting for at least 8 hours.  Glucose, capillary     Status: Abnormal   Collection Time: 06/23/23 11:32 AM  Result Value Ref Range   Glucose-Capillary 106 (H) 70 - 99 mg/dL    Comment: Glucose reference range applies only to samples taken after fasting for at least 8 hours.  APTT     Status: Abnormal   Collection Time: 06/23/23 11:39 AM  Result Value Ref Range   aPTT 76 (H) 24 - 36 seconds    Comment:        IF BASELINE aPTT IS ELEVATED, SUGGEST PATIENT RISK ASSESSMENT BE USED TO DETERMINE APPROPRIATE ANTICOAGULANT THERAPY. Performed at [facility], [address] [place], [place] [address]   Heparin level (unfractionated)     Status: Abnormal   Collection Time: 06/23/23 11:39 AM  Result Value Ref Range   Heparin Unfractionated >1.10 (H) 0.30 - 0.70 IU/mL    Comment: (NOTE) The clinical reportable range upper limit is being lowered to >1.10 to align with the FDA approved guidance for the current laboratory assay.  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 Performed at [facility], [address] [place], [place] [address]   ECHOCARDIOGRAM COMPLETE     Status: None   Collection Time: 06/23/23  2:15 PM  Result Value Ref Range   Weight 3,481.5 oz   Height 69 in   BP 118/75 mmHg   Ao pk vel 1.58 m/s   AV Area VTI 2.77 cm2   AR max vel 2.37 cm2   AV Mean grad 4.0 mmHg   AV Peak grad 10.0 mmHg   S' Lateral 3.70 cm   AV Area mean vel 2.35 cm2   Area-P 1/2  3.33 cm2   MV VTI 2.06 cm2   Est EF 60 - 65%   Glucose, capillary     Status: None   Collection Time: 06/23/23  4:00 PM  Result Value Ref Range   Glucose-Capillary 97 70 - 99 mg/dL    Comment: Glucose reference range applies only to samples taken after fasting for at least 8 hours.  APTT  Status: Abnormal   Collection Time: 06/24/23  3:53 AM  Result Value Ref Range   aPTT 77 (H) 24 - 36 seconds    Comment:        IF BASELINE aPTT IS ELEVATED, SUGGEST PATIENT RISK ASSESSMENT BE USED TO DETERMINE APPROPRIATE ANTICOAGULANT THERAPY. Performed at [facility], [address] [place], [place] [address]   Heparin level (unfractionated)     Status: None   Collection Time: 06/24/23  3:53 AM  Result Value Ref Range   Heparin Unfractionated 0.70 0.30 - 0.70 IU/mL    Comment: (NOTE) The clinical reportable range upper limit is being lowered to >1.10 to align with the FDA approved guidance for the current laboratory assay.  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 Performed at [facility], [address] [place], [place] [address]   CBC     Status: Abnormal   Collection Time: 06/24/23  3:53 AM  Result Value Ref Range   WBC 6.1 4.0 - 10.5 K/uL   RBC 3.43 (L) 4.22 - 5.81 MIL/uL   Hemoglobin 10.6 (L) 13.0 - 17.0 g/dL   HCT 13.0 (L) 86.5 - 78.4 %   MCV 95.3 80.0 - 100.0 fL   MCH 30.9 26.0 - 34.0 pg   MCHC 32.4 30.0 - 36.0 g/dL   RDW 69.6 29.5 - 28.4 %   Platelets 169 150 - 400 K/uL   nRBC 0.0 0.0 - 0.2 %    Comment: Performed at [facility], [address] [place], [place] [address]  Basic metabolic panel     Status: Abnormal   Collection Time: 06/24/23  3:53 AM  Result Value Ref Range   Sodium 134 (L) 135 - 145 mmol/L   Potassium 3.6 3.5 - 5.1 mmol/L   Chloride 104 98 - 111 mmol/L   CO2 19 (L) 22 - 32 mmol/L   Glucose, Bld 113 (H) 70 - 99 mg/dL     Comment: Glucose reference range applies only to samples taken after fasting for at least 8 hours.   BUN 35 (H) 8 - 23 mg/dL   Creatinine, Ser 0.10 (H) 0.61 - 1.24 mg/dL   Calcium 7.9 (L) 8.9 - 10.3 mg/dL   GFR, Estimated 42 (L) >60 mL/min    Comment: (NOTE) Calculated using the CKD-EPI Creatinine Equation (2021)    Anion gap 11 5 - 15    Comment: Performed at [facility], [address] [place], [place] [address]  Magnesium     Status: None   Collection Time: 06/24/23  3:53 AM  Result Value Ref Range   Magnesium 2.0 1.7 - 2.4 mg/dL    Comment: Performed at [facility], [address] [place], [place] [address]  Basic metabolic panel     Status: Abnormal   Collection Time: 06/25/23  3:26 AM  Result Value Ref Range   Sodium 139 135 - 145 mmol/L   Potassium 4.2 3.5 - 5.1 mmol/L   Chloride 109 98 - 111 mmol/L   CO2 23 22 - 32 mmol/L   Glucose, Bld 117 (H) 70 - 99 mg/dL    Comment: Glucose reference range applies only to samples taken after fasting for at least 8 hours.   BUN 29 (H) 8 - 23 mg/dL   Creatinine, Ser 3.47 (H) 0.61 - 1.24 mg/dL   Calcium 8.6 (L) 8.9 - 10.3 mg/dL   GFR,  Estimated 47 (L) >60 mL/min    Comment: (NOTE) Calculated using the CKD-EPI Creatinine Equation (2021)    Anion gap 7 5 - 15    Comment: Performed at [facility], [address] [place], [place] [address]  Magnesium     Status: None   Collection Time: 06/25/23  3:26 AM  Result Value Ref Range   Magnesium 2.1 1.7 - 2.4 mg/dL    Comment: Performed at [facility], [address] [place], [place] [address]  APTT     Status: Abnormal   Collection Time: 06/25/23  3:26 AM  Result Value Ref Range   aPTT 64 (H) 24 - 36 seconds    Comment:        IF BASELINE aPTT IS ELEVATED, SUGGEST PATIENT RISK ASSESSMENT BE USED TO DETERMINE APPROPRIATE ANTICOAGULANT THERAPY. Performed at [facility], [address] [place], [place] [address]   CBC      Status: Abnormal   Collection Time: 06/25/23  3:26 AM  Result Value Ref Range   WBC 6.1 4.0 - 10.5 K/uL   RBC 3.67 (L) 4.22 - 5.81 MIL/uL   Hemoglobin 11.2 (L) 13.0 - 17.0 g/dL   HCT 96.2 (L) 95.2 - 84.1 %   MCV 92.6 80.0 - 100.0 fL   MCH 30.5 26.0 - 34.0 pg   MCHC 32.9 30.0 - 36.0 g/dL   RDW 32.4 40.1 - 02.7 %   Platelets 211 150 - 400 K/uL   nRBC 0.0 0.0 - 0.2 %    Comment: Performed at [facility], [address] [place] [address] [place], [place] [address]  Heparin level (unfractionated)     Status: None   Collection Time: 06/25/23  3:26 AM  Result Value Ref Range   Heparin Unfractionated 0.39 0.30 - 0.70 IU/mL    Comment: (NOTE) The clinical reportable range upper limit is being lowered to >1.10 to align with the FDA approved guidance for the current laboratory assay.  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 Performed at [facility], [address] [place], [place] [address]   APTT     Status: Abnormal   Collection Time: 06/25/23 12:44 PM  Result Value Ref Range   aPTT 99 (H) 24 - 36 seconds    Comment:        IF BASELINE aPTT IS ELEVATED, SUGGEST PATIENT RISK ASSESSMENT BE USED TO DETERMINE APPROPRIATE ANTICOAGULANT THERAPY. Performed at [facility], [address] [place], [place] [address]   POCT Activated clotting time     Status: None   Collection Time: 06/25/23  3:11 PM  Result Value Ref Range   Activated Clotting Time 273 seconds    Comment: Reference range 74-137 seconds for patients not on anticoagulant therapy.  POCT Activated clotting time     Status: None   Collection Time: 06/25/23  3:27 PM  Result Value Ref Range   Activated Clotting Time 250 seconds    Comment: Reference range 74-137 seconds for patients not on anticoagulant therapy.  Basic metabolic panel     Status: Abnormal   Collection Time: 06/26/23  5:34 AM  Result Value Ref  Range   Sodium 140 135 - 145 mmol/L   Potassium 3.9 3.5 - 5.1 mmol/L   Chloride 110 98 - 111 mmol/L   CO2 21 (L) 22 - 32 mmol/L   Glucose, Bld 105 (H) 70 - 99 mg/dL  Comment: Glucose reference range applies only to samples taken after fasting for at least 8 hours.   BUN 20 8 - 23 mg/dL   Creatinine, Ser 4.09 (H) 0.61 - 1.24 mg/dL   Calcium 8.5 (L) 8.9 - 10.3 mg/dL   GFR, Estimated 49 (L) >60 mL/min    Comment: (NOTE) Calculated using the CKD-EPI Creatinine Equation (2021)    Anion gap 9 5 - 15    Comment: Performed at [facility], [address] [place], [place] [address]  Magnesium     Status: None   Collection Time: 06/26/23  5:34 AM  Result Value Ref Range   Magnesium 2.0 1.7 - 2.4 mg/dL    Comment: Performed at [facility], [address] [place], [place] [address]  CBC     Status: Abnormal   Collection Time: 06/26/23  5:34 AM  Result Value Ref Range   WBC 5.6 4.0 - 10.5 K/uL   RBC 3.73 (L) 4.22 - 5.81 MIL/uL   Hemoglobin 11.4 (L) 13.0 - 17.0 g/dL   HCT 56.2 (L) 13.0 - 86.5 %   MCV 92.0 80.0 - 100.0 fL   MCH 30.6 26.0 - 34.0 pg   MCHC 33.2 30.0 - 36.0 g/dL   RDW 78.4 69.6 - 29.5 %   Platelets 216 150 - 400 K/uL   nRBC 0.0 0.0 - 0.2 %    Comment: Performed at [facility], [address] [place], [place] [address]  Heparin level (unfractionated)     Status: None   Collection Time: 06/26/23  5:34 AM  Result Value Ref Range   Heparin Unfractionated 0.44 0.30 - 0.70 IU/mL    Comment: (NOTE) The clinical reportable range upper limit is being lowered to >1.10 to align with the FDA approved guidance for the current laboratory assay.  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 Performed at [facility], [address] [place], [place] [address]   APTT     Status: Abnormal   Collection Time: 06/26/23  5:34 AM  Result Value Ref  Range   aPTT 84 (H) 24 - 36 seconds    Comment:        IF BASELINE aPTT IS ELEVATED, SUGGEST PATIENT RISK ASSESSMENT BE USED TO DETERMINE APPROPRIATE ANTICOAGULANT THERAPY. Performed at [facility], [address] [place], [place] [address]     Radiology VAS [place] UPPER EXTREMITY ARTERIAL DUPLEX Result Date: 07/21/2023  UPPER EXTREMITY DUPLEX STUDY Patient Name:  COULTON SCHLINK  Date of Exam:   07/19/2023 Medical Rec #: [id number]              Accession #:    [id number] Date of Birth: December 19, 1933              Patient Gender: M Patient Age:   10 years Exam Location:  [place] Procedure:      VAS [place] UPPER EXTREMITY ARTERIAL DUPLEX Referring Phys: CHRISTOPHER BERGE --------------------------------------------------------------------------------  Indications: Palpable knot. History:     Patient has a history of catheterization via right radial artery.  Risk Factors: Hypertension, hyperlipidemia, past history of smoking, prior CVA. Comparison Study: No previous Performing Technologist: Quentin Ore RDMS, RVT, RDCS  Examination Guidelines: A complete evaluation includes B-mode imaging, spectral Doppler, color Doppler, and power Doppler as needed of all accessible portions of each vessel. Bilateral testing is considered an integral part of a complete examination. Limited examinations  for reoccurring indications may be performed as noted.  Right Doppler Findings: +---------------+----------+---------+--------+--------+ Site           PSV (cm/s)Waveform StenosisComments +---------------+----------+---------+--------+--------+ Subclavian Dist          triphasic                 +---------------+----------+---------+--------+--------+ Radial Prox    137       biphasic                  +---------------+----------+---------+--------+--------+ Radial Mid     136       biphasic                  +---------------+----------+---------+--------+--------+ Radial Dist    135        triphasic                 +---------------+----------+---------+--------+--------+ Cavity at pulsatile puncture site meas, 1.0 x2.0 cm. Positive of "yin-yang" bloodflow Pseudo neck meas up to 0.222 cm  An area with well defined borders measuring 1.0 cm x 2.0 cm was visualized arising off of the distal right radial art with ultrasound characteristics of a pseudoaneurysm. The neck measures approximately 0.2 cm wide and 0.5 cm long.  Suggest Peripheral Vascular Consult. Electronically signed by Delrae Rend on 07/21/2023 at 6:25:17 AM.    Final    VAS US CAROTID Result Date: 07/03/2023 Carotid Arterial Duplex Study Patient Name:  Jonathan [name]  Date of Exam:   07/02/2023 Medical Rec #: [id number]              Accession #:    [id number] Date of Birth: 22-Feb-1934              Patient Gender: M Patient Age:   85 years Exam Location:  [facility] Procedure:      VAS US CAROTID Referring Phys: Festus Barren --------------------------------------------------------------------------------  Indications:  Carotid artery disease. Risk Factors: Past history of smoking, prior MI, coronary artery disease. Performing Technologist: Hardie Lora RVT  Examination Guidelines: A complete evaluation includes B-mode imaging, spectral Doppler, color Doppler, and power Doppler as needed of all accessible portions of each vessel. Bilateral testing is considered an integral part of a complete examination. Limited examinations for reoccurring indications may be performed as noted.  Right Carotid Findings: +----------+--------+--------+--------+------------------+--------+           PSV cm/sEDV cm/sStenosisPlaque DescriptionComments +----------+--------+--------+--------+------------------+--------+ CCA Prox  94      16                                         +----------+--------+--------+--------+------------------+--------+ CCA Mid   71      15                                          +----------+--------+--------+--------+------------------+--------+ CCA Distal66      13      <50%    smooth                     +----------+--------+--------+--------+------------------+--------+ ICA Prox  58      17      1-39%   smooth                     +----------+--------+--------+--------+------------------+--------+ ICA Mid  107     27              smooth                     +----------+--------+--------+--------+------------------+--------+ ICA Distal92      31                                         +----------+--------+--------+--------+------------------+--------+ ECA       95      0                                          +----------+--------+--------+--------+------------------+--------+ +----------+--------+-------+----------------+-------------------+           PSV cm/sEDV cmsDescribe        Arm Pressure (mmHG) +----------+--------+-------+----------------+-------------------+ [id number]             Multiphasic, WNL                    +----------+--------+-------+----------------+-------------------+ +---------+--------+--+--------+-+---------+ VertebralPSV cm/s34EDV cm/s8Antegrade +---------+--------+--+--------+-+---------+  Left Carotid Findings: +----------+--------+--------+--------+-----------------------+--------+           PSV cm/sEDV cm/sStenosisPlaque Description     Comments +----------+--------+--------+--------+-----------------------+--------+ CCA Prox  99      17                                              +----------+--------+--------+--------+-----------------------+--------+ CCA Mid   82      21                                              +----------+--------+--------+--------+-----------------------+--------+ CCA Distal58      16                                              +----------+--------+--------+--------+-----------------------+--------+ ICA Prox  48      14      1-39%   smooth and  heterogenous         +----------+--------+--------+--------+-----------------------+--------+ ICA Mid   88      32                                              +----------+--------+--------+--------+-----------------------+--------+ ICA Distal89      25                                              +----------+--------+--------+--------+-----------------------+--------+ ECA       82      0                                               +----------+--------+--------+--------+-----------------------+--------+ +----------+--------+--------+----------------+-------------------+  PSV cm/sEDV cm/sDescribe        Arm Pressure (mmHG) +----------+--------+--------+----------------+-------------------+ [id number]              Multiphasic, WNL                    +----------+--------+--------+----------------+-------------------+ +---------+--------+--+--------+-+---------+ VertebralPSV cm/s34EDV cm/s9Antegrade +---------+--------+--+--------+-+---------+   Summary: Right Carotid: Velocities in the right ICA are consistent with a 1-39% stenosis.                Non-hemodynamically significant plaque <50% noted in the CCA. Left Carotid: Velocities in the left ICA are consistent with a 1-39% stenosis. Vertebrals:  Bilateral vertebral arteries demonstrate antegrade flow. Subclavians: Normal flow hemodynamics were seen in bilateral subclavian              arteries. *See table(s) above for measurements and observations.  Electronically signed by Festus Barren MD on 07/03/2023 at 2:12:20 PM.    Final    VAS [place] LOWER EXTREMITY VENOUS REFLUX Result Date: 07/03/2023  Lower Venous Reflux Study Patient Name:  Jonathan [name]  Date of Exam:   07/02/2023 Medical Rec #: [id number]              Accession #:    [id number] Date of Birth: 1933-07-16              Patient Gender: M Patient Age:   23 years Exam Location:  [facility] Procedure:      VAS [place] LOWER EXTREMITY VENOUS REFLUX  Referring Phys: Festus Barren --------------------------------------------------------------------------------  Indications: Pain, Swelling, and Edema.  Performing Technologist: Hardie Lora RVT  Examination Guidelines: A complete evaluation includes B-mode imaging, spectral Doppler, color Doppler, and power Doppler as needed of all accessible portions of each vessel. Bilateral testing is considered an integral part of a complete examination. Limited examinations for reoccurring indications may be performed as noted. The reflux portion of the exam is performed with the patient in reverse Trendelenburg. Significant venous reflux is defined as >500 ms in the superficial venous system, and >1 second in the deep venous system.  Venous Reflux Times +--------------+---------+------+-----------+------------+--------+ RIGHT         Reflux NoRefluxReflux TimeDiameter cmsComments                         Yes                                  +--------------+---------+------+-----------+------------+--------+ CFV           no                                             +--------------+---------+------+-----------+------------+--------+ FV prox       no                                             +--------------+---------+------+-----------+------------+--------+ FV mid        no                                             +--------------+---------+------+-----------+------------+--------+  FV dist       no                                             +--------------+---------+------+-----------+------------+--------+ Popliteal     no                                             +--------------+---------+------+-----------+------------+--------+ GSV at SFJ              yes    >500 ms      0.49             +--------------+---------+------+-----------+------------+--------+ GSV prox thigh          yes    >500 ms      0.52              +--------------+---------+------+-----------+------------+--------+ GSV mid thigh           yes    >500 ms      0.50             +--------------+---------+------+-----------+------------+--------+ GSV dist thigh          yes    >500 ms      0.43             +--------------+---------+------+-----------+------------+--------+ GSV at knee             yes    >500 ms      0.64             +--------------+---------+------+-----------+------------+--------+ GSV prox calf           yes    >500 ms      0.52             +--------------+---------+------+-----------+------------+--------+ SSV Pop Fossa no                            0.33             +--------------+---------+------+-----------+------------+--------+ SSV prox calf no                            0.21             +--------------+---------+------+-----------+------------+--------+ SSV mid calf  no                            0.19             +--------------+---------+------+-----------+------------+--------+  +--------------+---------+------+-----------+------------+--------+ LEFT          Reflux NoRefluxReflux TimeDiameter cmsComments                         Yes                                  +--------------+---------+------+-----------+------------+--------+ CFV                     yes                                  +--------------+---------+------+-----------+------------+--------+  FV prox       no                                             +--------------+---------+------+-----------+------------+--------+ FV mid        no                                             +--------------+---------+------+-----------+------------+--------+ FV dist       no                                             +--------------+---------+------+-----------+------------+--------+ Popliteal     no                                              +--------------+---------+------+-----------+------------+--------+ GSV at SFJ              yes    >500 ms      0.65             +--------------+---------+------+-----------+------------+--------+ GSV prox thigh          yes    >500 ms      0.40             +--------------+---------+------+-----------+------------+--------+ GSV mid thigh           yes    >500 ms      0.51             +--------------+---------+------+-----------+------------+--------+ GSV dist thighno                            0.37             +--------------+---------+------+-----------+------------+--------+ GSV at knee   no                            0.34             +--------------+---------+------+-----------+------------+--------+ GSV prox calf           yes    >500 ms      0.37             +--------------+---------+------+-----------+------------+--------+ SSV Pop Fossa no                            0.44             +--------------+---------+------+-----------+------------+--------+ SSV prox calf no                            0.27             +--------------+---------+------+-----------+------------+--------+ SSV mid calf  no                            0.17             +--------------+---------+------+-----------+------------+--------+   Summary: Right: -  No evidence of deep vein thrombosis seen in the right lower extremity, from the common femoral through the popliteal veins. - No evidence of superficial venous thrombosis in the right lower extremity. - No evidence of superficial venous reflux seen in the right short saphenous vein. - Venous reflux is noted in the right sapheno-femoral junction. - Venous reflux is noted in the right greater saphenous vein in the thigh. - Venous reflux is noted in the right greater saphenous vein in the calf.  Left: - No evidence of deep vein thrombosis seen in the left lower extremity, from the common femoral through the popliteal veins. - No  evidence of superficial venous reflux seen in the left short saphenous vein. - Venous reflux is noted in the left sapheno-femoral junction. - Venous reflux is noted in the left greater saphenous vein in the thigh. - Venous reflux is noted in the left greater saphenous vein in the calf. - SSV is non compressible consistent with chronic thrombus.  *See table(s) above for measurements and observations. Electronically signed by Festus Barren MD on 07/03/2023 at 2:12:13 PM.    Final     Assessment/Plan  Pseudoaneurysm of vascular access site 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  We discussed with the size and enlargement, this is unlikely to be something that spontaneously resolves.  We discussed options for treatment.  Although coil embolization of the radial artery would be a reasonable option for treatment of this, the large lump would persist for some time and may never completely go away.  He appears to have good ulnar artery flow and no ischemic symptoms to his hand even with compression of the radial artery.  I believe a direct surgical repair of this would be the most appropriate and likely ligation of his radial artery both proximal and distal will be performed.  This will allow removal of the large aneurysms well.  I discussed the risk and benefits of the procedure.  The patient voices his understanding and is agreeable to proceed.  Carotid stenosis Carotid duplex earlier this month demonstrates stable 1 to 39% ICA stenosis bilaterally.  No role for intervention at this level.  He is on Plavix, Eliquis, and Lipitor.  This may be more for his cardiac disease and his carotid disease, but this would be (more than) adequate therapy for his carotid disease as well.  Follow-up in 1 year with carotid duplex.  Benign hypertension blood pressure control important in reducing the progression of atherosclerotic disease. On appropriate oral medications.      Prediabetes blood glucose control important in reducing the progression of atherosclerotic disease. Also, involved in wound healing. Not on medications.     Hyperlipidemia lipid control important in reducing the progression of atherosclerotic disease. Continue statin therapy  Varicose veins of leg with edema, right A reflux study was performed earlier this month demonstrating a long segment reflux in the right great saphenous vein throughout its course including the saphenofemoral junction.  The left great saphenous vein also has significant reflux throughout most of its course including the saphenofemoral junction.  Laser ablation insurance approval is pending.  Festus Barren, MD  07/26/2023 10:09 AM    This note was created with Dragon medical transcription system.  Any errors from dictation are purely unintentional

## 2023-07-28 MED ORDER — LACTATED RINGERS IV SOLN: INTRAVENOUS | Status: DC

## 2023-07-28 MED ORDER — VANCOMYCIN HCL IN DEXTROSE 1-5 GM/200ML-% IV SOLN: 1000.0000 mg | INTRAVENOUS | Status: DC

## 2023-07-28 MED ORDER — CHLORHEXIDINE GLUCONATE CLOTH 2 % EX PADS: 6.0000 | MEDICATED_PAD | Freq: Once | CUTANEOUS | Status: DC

## 2023-07-28 MED ORDER — CHLORHEXIDINE GLUCONATE 0.12 % MT SOLN
15.0000 mL | Freq: Once | OROMUCOSAL | Status: AC
  Administered 2023-07-29: 15 mL via OROMUCOSAL

## 2023-07-28 MED ORDER — ORAL CARE MOUTH RINSE: 15.0000 mL | Freq: Once | OROMUCOSAL | Status: AC

## 2023-07-29 ENCOUNTER — Ambulatory Visit
Admission: RE | Admit: 2023-07-29 | Discharge: 2023-07-29 | Disposition: A | Discharge status: Home / Self Care | Payer: PPO | Attending: Vascular Surgery | Admitting: Vascular Surgery

## 2023-07-29 ENCOUNTER — Other Ambulatory Visit: Payer: Self-pay

## 2023-07-29 ENCOUNTER — Encounter: Payer: Self-pay | Admitting: Vascular Surgery

## 2023-07-29 ENCOUNTER — Ambulatory Visit: Payer: PPO | Admitting: Certified Registered Nurse Anesthetist

## 2023-07-29 ENCOUNTER — Encounter
Admission: RE | Disposition: A | Discharge status: Home / Self Care | Payer: Self-pay | Source: Home / Self Care | Attending: Vascular Surgery

## 2023-07-29 DIAGNOSIS — Z9889 Other specified postprocedural states: Secondary | ICD-10-CM

## 2023-07-29 DIAGNOSIS — I721 Aneurysm of artery of upper extremity: Secondary | ICD-10-CM | POA: Insufficient documentation

## 2023-07-29 DIAGNOSIS — T81718A Complication of other artery following a procedure, not elsewhere classified, initial encounter: Secondary | ICD-10-CM

## 2023-07-29 DIAGNOSIS — I25110 Atherosclerotic heart disease of native coronary artery with unstable angina pectoris: Secondary | ICD-10-CM | POA: Diagnosis not present

## 2023-07-29 DIAGNOSIS — I10 Essential (primary) hypertension: Secondary | ICD-10-CM | POA: Diagnosis not present

## 2023-07-29 DIAGNOSIS — Z87891 Personal history of nicotine dependence: Secondary | ICD-10-CM | POA: Diagnosis not present

## 2023-07-29 DIAGNOSIS — I9789 Other postprocedural complications and disorders of the circulatory system, not elsewhere classified: Secondary | ICD-10-CM | POA: Diagnosis not present

## 2023-07-29 HISTORY — PX: FALSE ANEURYSM REPAIR: SHX5152

## 2023-07-29 LAB — TYPE AND SCREEN
ABO/RH(D): B POS
Antibody Screen: NEGATIVE

## 2023-07-29 LAB — ABO/RH: ABO/RH(D): B POS

## 2023-07-29 SURGERY — REPAIR, PSEUDOANEURYSM
Anesthesia: General | Laterality: Right

## 2023-07-29 MED ORDER — ACETAMINOPHEN 10 MG/ML IV SOLN
INTRAVENOUS | Status: AC
  Filled 2023-07-29: qty 100

## 2023-07-29 MED ORDER — PROPOFOL 10 MG/ML IV BOLUS
INTRAVENOUS | Status: AC
  Filled 2023-07-29: qty 20

## 2023-07-29 MED ORDER — PHENYLEPHRINE 80 MCG/ML (10ML) SYRINGE FOR IV PUSH (FOR BLOOD PRESSURE SUPPORT)
PREFILLED_SYRINGE | INTRAVENOUS | Status: DC | PRN
  Administered 2023-07-29: 80 ug via INTRAVENOUS

## 2023-07-29 MED ORDER — CHLORHEXIDINE GLUCONATE 0.12 % MT SOLN
OROMUCOSAL | Status: AC
  Filled 2023-07-29: qty 15

## 2023-07-29 MED ORDER — PHENYLEPHRINE HCL-NACL 20-0.9 MG/250ML-% IV SOLN
INTRAVENOUS | Status: AC
  Filled 2023-07-29: qty 250

## 2023-07-29 MED ORDER — CEFAZOLIN SODIUM-DEXTROSE 2-4 GM/100ML-% IV SOLN
INTRAVENOUS | Status: AC
  Filled 2023-07-29: qty 100

## 2023-07-29 MED ORDER — VANCOMYCIN HCL IN DEXTROSE 1-5 GM/200ML-% IV SOLN
INTRAVENOUS | Status: AC
  Filled 2023-07-29: qty 200

## 2023-07-29 MED ORDER — FENTANYL CITRATE (PF) 100 MCG/2ML IJ SOLN
25.0000 ug | INTRAMUSCULAR | Status: DC | PRN
  Administered 2023-07-29 (×2): 50 ug via INTRAVENOUS

## 2023-07-29 MED ORDER — PROPOFOL 10 MG/ML IV BOLUS
INTRAVENOUS | Status: DC | PRN
  Administered 2023-07-29: 150 mg via INTRAVENOUS
  Administered 2023-07-29: 20 mg via INTRAVENOUS

## 2023-07-29 MED ORDER — OXYCODONE HCL 5 MG/5ML PO SOLN: 5.0000 mg | Freq: Once | ORAL | Status: AC | PRN

## 2023-07-29 MED ORDER — LIDOCAINE HCL (PF) 2 % IJ SOLN
INTRAMUSCULAR | Status: AC
  Filled 2023-07-29: qty 5

## 2023-07-29 MED ORDER — ACETAMINOPHEN 10 MG/ML IV SOLN: 1000.0000 mg | Freq: Once | INTRAVENOUS | Status: DC | PRN

## 2023-07-29 MED ORDER — PHENYLEPHRINE HCL-NACL 20-0.9 MG/250ML-% IV SOLN
INTRAVENOUS | Status: DC | PRN
  Administered 2023-07-29: 40 ug/min via INTRAVENOUS

## 2023-07-29 MED ORDER — HEPARIN SODIUM (PORCINE) 5000 UNIT/ML IJ SOLN
INTRAMUSCULAR | Status: AC
  Filled 2023-07-29: qty 1

## 2023-07-29 MED ORDER — OXYCODONE HCL 5 MG PO TABS
ORAL_TABLET | ORAL | Status: AC
  Filled 2023-07-29: qty 1

## 2023-07-29 MED ORDER — FENTANYL CITRATE (PF) 100 MCG/2ML IJ SOLN
INTRAMUSCULAR | Status: DC | PRN
  Administered 2023-07-29 (×2): 50 ug via INTRAVENOUS

## 2023-07-29 MED ORDER — LIDOCAINE HCL (CARDIAC) PF 100 MG/5ML IV SOSY
PREFILLED_SYRINGE | INTRAVENOUS | Status: DC | PRN
  Administered 2023-07-29: 100 mg via INTRAVENOUS

## 2023-07-29 MED ORDER — ONDANSETRON HCL 4 MG/2ML IJ SOLN
4.0000 mg | Freq: Four times a day (QID) | INTRAMUSCULAR | Status: DC | PRN
  Administered 2023-07-29: 4 mg via INTRAVENOUS

## 2023-07-29 MED ORDER — CEFAZOLIN SODIUM-DEXTROSE 2-4 GM/100ML-% IV SOLN
2.0000 g | Freq: Once | INTRAVENOUS | Status: AC
  Administered 2023-07-29: 2 g via INTRAVENOUS

## 2023-07-29 MED ORDER — HYDROMORPHONE HCL 1 MG/ML IJ SOLN: 1.0000 mg | Freq: Once | INTRAMUSCULAR | Status: DC | PRN

## 2023-07-29 MED ORDER — OXYCODONE HCL 5 MG PO TABS
5.0000 mg | ORAL_TABLET | Freq: Once | ORAL | Status: AC | PRN
  Administered 2023-07-29: 5 mg via ORAL

## 2023-07-29 MED ORDER — ACETAMINOPHEN 10 MG/ML IV SOLN
INTRAVENOUS | Status: DC | PRN
  Administered 2023-07-29: 1000 mg via INTRAVENOUS

## 2023-07-29 MED ORDER — ONDANSETRON HCL 4 MG/2ML IJ SOLN: 4.0000 mg | Freq: Once | INTRAMUSCULAR | Status: DC | PRN

## 2023-07-29 MED ORDER — FENTANYL CITRATE (PF) 100 MCG/2ML IJ SOLN
INTRAMUSCULAR | Status: AC
  Filled 2023-07-29: qty 2

## 2023-07-29 MED ORDER — HYDROCODONE-ACETAMINOPHEN 5-325 MG PO TABS: 2.0000 | ORAL_TABLET | Freq: Four times a day (QID) | ORAL | 0 refills | Status: DC | PRN

## 2023-07-29 MED ORDER — DEXAMETHASONE SODIUM PHOSPHATE 10 MG/ML IJ SOLN
INTRAMUSCULAR | Status: DC | PRN
  Administered 2023-07-29: 4 mg via INTRAVENOUS

## 2023-07-29 SURGICAL SUPPLY — 46 items
APPLIER CLIP 11 MED OPEN (CLIP)
APPLIER CLIP 9.375 SM OPEN (CLIP)
BAG DECANTER FOR FLEXI CONT (MISCELLANEOUS) ×1 IMPLANT
BLADE SURG 15 STRL LF DISP TIS (BLADE) ×1 IMPLANT
BLADE SURG SZ11 CARB STEEL (BLADE) ×1 IMPLANT
CLAMP SUTURE YELLOW 5 PAIRS (MISCELLANEOUS) ×1 IMPLANT
CLIP APPLIE 11 MED OPEN (CLIP) IMPLANT
CLIP APPLIE 9.375 SM OPEN (CLIP) IMPLANT
DERMABOND ADVANCED .7 DNX12 (GAUZE/BANDAGES/DRESSINGS) ×1 IMPLANT
DRAPE INCISE IOBAN 66X45 STRL (DRAPES) ×1 IMPLANT
DRSG OPSITE POSTOP 4X6 (GAUZE/BANDAGES/DRESSINGS) IMPLANT
ELECT CAUTERY BLADE 6.4 (BLADE) ×1 IMPLANT
ELECT REM PT RETURN 9FT ADLT (ELECTROSURGICAL) ×1
ELECTRODE REM PT RTRN 9FT ADLT (ELECTROSURGICAL) ×1 IMPLANT
GAUZE 4X4 16PLY ~~LOC~~+RFID DBL (SPONGE) ×1 IMPLANT
GEL ULTRASOUND 20GR AQUASONIC (MISCELLANEOUS) IMPLANT
GLOVE BIO SURGEON STRL SZ7 (GLOVE) ×2 IMPLANT
GOWN STRL REUS W/ TWL LRG LVL3 (GOWN DISPOSABLE) ×2 IMPLANT
GOWN STRL REUS W/TWL 2XL LVL3 (GOWN DISPOSABLE) ×1 IMPLANT
IV NS 500ML BAXH (IV SOLUTION) ×1 IMPLANT
KIT TURNOVER KIT A (KITS) ×1 IMPLANT
LABEL OR SOLS (LABEL) ×1 IMPLANT
LOOP VESSEL MAXI 1X406 RED (MISCELLANEOUS) ×3 IMPLANT
LOOP VESSEL MINI 0.8X406 BLUE (MISCELLANEOUS) ×1 IMPLANT
MANIFOLD NEPTUNE II (INSTRUMENTS) ×1 IMPLANT
NDL HYPO 25X1 1.5 SAFETY (NEEDLE) IMPLANT
NEEDLE HYPO 25X1 1.5 SAFETY (NEEDLE)
NS IRRIG 1000ML POUR BTL (IV SOLUTION) ×1 IMPLANT
PACK BASIN MAJOR ARMC (MISCELLANEOUS) ×1 IMPLANT
PACK UNIVERSAL (MISCELLANEOUS) ×1 IMPLANT
SPONGE T-LAP 18X18 ~~LOC~~+RFID (SPONGE) ×2 IMPLANT
SUT MNCRL 4-0 27 PS-2 XMFL (SUTURE) ×2
SUT PROLENE 5 0 RB 1 DA (SUTURE) IMPLANT
SUT SILK 2-0 18XBRD TIE 12 (SUTURE) ×1 IMPLANT
SUT SILK 3-0 18XBRD TIE 12 (SUTURE) ×1 IMPLANT
SUT SILK 4-0 18XBRD TIE 12 (SUTURE) ×1 IMPLANT
SUT VIC AB 2-0 CT1 (SUTURE) ×2 IMPLANT
SUT VICRYL+ 3-0 36IN CT-1 (SUTURE) ×2 IMPLANT
SUTURE MNCRL 4-0 27XMF (SUTURE) ×1 IMPLANT
SYR 20ML LL LF (SYRINGE) ×1 IMPLANT
SYR 5ML LL (SYRINGE) ×1 IMPLANT
SYR BULB IRRIG 60ML STRL (SYRINGE) ×1 IMPLANT
TAG SUTURE CLAMP YLW 5PR (MISCELLANEOUS) ×1
TRAP FLUID SMOKE EVACUATOR (MISCELLANEOUS) ×1 IMPLANT
TRAY FOLEY MTR SLVR 16FR STAT (SET/KITS/TRAYS/PACK) ×1 IMPLANT
WATER STERILE IRR 500ML POUR (IV SOLUTION) ×1 IMPLANT

## 2023-07-29 NOTE — Transfer of Care (Signed)
Immediate Anesthesia Transfer of Care Note  Patient: Jonathan [name]  Procedure(s) Performed: REPAIR FALSE ANEURYSM (RADIAL ARTERY ANEURYSM REPAIR) (Right)  Patient Location: PACU  Anesthesia Type:General  Level of Consciousness: awake and alert   Airway & Oxygen Therapy: Patient Spontanous Breathing and Patient connected to face mask oxygen  Post-op Assessment: Report given to RN and Post -op Vital signs reviewed and stable  Post vital signs: stable  Last Vitals:  Vitals Value Taken Time  BP 146/76 07/29/23 1407  Temp 36.3 C 07/29/23 1407  Pulse 72 07/29/23 1409  Resp 18 07/29/23 1409  SpO2 100 % 07/29/23 1409  Vitals shown include unfiled device data.  Last Pain:  Vitals:   07/29/23 1132  TempSrc: Temporal  PainSc: 0-No pain         Complications: No notable events documented.

## 2023-07-29 NOTE — Anesthesia Preprocedure Evaluation (Signed)
Anesthesia Evaluation  Patient identified by MRN, date of birth, ID band Patient awake    Reviewed: Allergy & Precautions, NPO status , Patient's Chart, lab work & pertinent test results  History of Anesthesia Complications Negative for: history of anesthetic complications  Airway Mallampati: II  TM Distance: >3 FB Neck ROM: Full    Dental  (+) Teeth Intact   Pulmonary neg pulmonary ROS, neg sleep apnea, neg COPD, Patient abstained from smoking.Not current smoker, former smoker   Pulmonary exam normal breath sounds clear to auscultation       Cardiovascular Exercise Tolerance: Good METShypertension, Pt. on medications + angina  + CAD, + Past MI and + Cardiac Stents  (-) dysrhythmias  Rhythm:Regular Rate:Normal - Systolic murmurs Stent placed 4 weeks ago ; had pericarditis treated with colchicine, taken for cath at that time due to concern for ACS, found to have LAD disease which was intervened on a few days later.  Followed up with cardiologist outpatient after, cardiologist aware that patient was having this procedure done soon.   Neuro/Psych CVA, No Residual Symptoms  negative psych ROS   GI/Hepatic ,neg GERD  ,,(+)     (-) substance abuse    Endo/Other  neg diabetes    Renal/GU CRFRenal disease     Musculoskeletal   Abdominal   Peds  Hematology   Anesthesia Other Findings Past Medical History: No date: Acute pericarditis     Comment:  a. 05/2023-->colchicine x 3 mos. No date: CAD (coronary artery disease)     Comment:  a. 05/2023 PCI: LM nl, LAD 80p (2.75x12 Onyx DES), LCX               59m, RCA 70ost/p. No date: Hypercholesteremia No date: Hypertension No date: Mitral regurgitation     Comment:  a. 02/2023 Echo: EF 60-65%, no rwma, mild LVH, nl RV fxn,              RVSP 39.61mmHg, mod dil LA, mod MR, mild-mod TR, AoV               sclerosis. No date: Permanent atrial fibrillation (HCC)     Comment:  a.  CHA2DS2VASc = 6-->eliquis. No date: Pre-diabetes No date: Prostate cancer (HCC) No date: Stroke [facility])  Reproductive/Obstetrics                             Anesthesia Physical Anesthesia Plan  ASA: 3  Anesthesia Plan: General   Post-op Pain Management: Ofirmev IV (intra-op)*   Induction: Intravenous  PONV Risk Score and Plan: 2 and Ondansetron, Dexamethasone and Treatment may vary due to age or medical condition  Airway Management Planned: LMA  Additional Equipment: None  Intra-op Plan:   Post-operative Plan: Extubation in OR  Informed Consent: I have reviewed the patients History and Physical, chart, labs and discussed the procedure including the risks, benefits and alternatives for the proposed anesthesia with the patient or authorized representative who has indicated his/her understanding and acceptance.     Dental advisory given  Plan Discussed with: CRNA and Surgeon  Anesthesia Plan Comments: (Discussed risks of anesthesia with patient, including PONV, sore throat, lip/dental/eye damage. Rare risks discussed as well, such as cardiorespiratory and neurological sequelae, and allergic reactions. I did discuss higher cardiovascular risk given recent stent placement; however given that this radial artery aneurysm has continued growing over the past month, patient understands these risks and wishes to fix it. Discussed the role  of CRNA in patient's perioperative care. Patient understands.)        Anesthesia Quick Evaluation

## 2023-07-29 NOTE — Op Note (Signed)
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   Repair of right radial artery aneurysm with ligation proximally and distally and excision of the aneurysm  SURGEON: Festus Barren, MD  ASSISTANT(S): None  ANESTHESIA: General  ESTIMATED BLOOD LOSS: 10 cc  FINDING(S): 1.  Good ulnar artery flow with good hand perfusion with ligation of the radial artery. 2.  Large pseudoaneurysm of the right radial artery with mural thrombus present.  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  This has become quite large and has begun having skin threat and he was referred to our office.  He is brought in for repair of this aneurysm.  It was discussed with the patient if his ulnar perfusion was adequate, this may be ligated proximally and distally and not reconstructed.  Risks and benefits were discussed and the patient was agreeable to proceed..  DESCRIPTION: After obtaining full informed written consent, the patient was brought back to the operating room and placed supine upon the operating table.  The patient received IV antibiotics prior to induction.  After obtaining adequate anesthesia, the patient was prepped and draped in the standard fashion. I made an incision overlying the radial artery proximal to the aneurysm as well as the radial artery distal to the aneurysm.  Initially, I dissected out the radial artery proximal to the aneurysm and gained control about 2 cm proximal to the actual aneurysm.  I then occluded the radial artery at this location while listening to the ulnar artery with a Doppler.  There continues to be triphasic waveform in the ulnar artery and the fingers continue to be warm and pink  with good capillary refill even with occlusion of the radial artery.  I then ligated the radial artery with a 2-0 silk tie proximally.  I then dissected out the radial artery distal to the aneurysm.  This was 1 to 2 cm beyond the aneurysm itself.  I then ligated the radial artery at this location with a 2-0 silk tie.  I then connected the incisions overlying the aneurysm and on opening the skin a large amount of mural thrombus from the pseudoaneurysm was evacuated.  The pseudoaneurysm contents were then sent as a specimen.  There was a small amount of bleeding from sidebranches in the radial artery that was then oversewn with 5-0 Prolene sutures with complete hemostasis achieved.  The wound was then irrigated and closed with a 3-0 Vicryl and a 4-0 Monocryl. Dermabond was placed and a honeycomb dressing was placed over the dried Dermabond. The patient was taken to the recovery room in stable condition having tolerated the procedure well.  COMPLICATIONS: None  CONDITION: Stable   Festus Barren 07/29/2023 2:10 PM   This note was created with Dragon Medical transcription system. Any errors in dictation are purely unintentional.

## 2023-07-29 NOTE — Interval H&P Note (Signed)
History and Physical Interval Note:  07/29/2023 11:01 AM  Jonathan [name]  has presented today for surgery, with the diagnosis of PSEUDOANEURYSM OF VASCULAR ACCESS SITE.  The various methods of treatment have been discussed with the patient and family. After consideration of risks, benefits and other options for treatment, the patient has consented to  Procedure(s): REPAIR FALSE ANEURYSM (RADIAL ARTERY ANEURYSM REPAIR) (Right) as a surgical intervention.  The patient's history has been reviewed, patient examined, no change in status, stable for surgery.  I have reviewed the patient's chart and labs.  Questions were answered to the patient's satisfaction.     Festus Barren

## 2023-07-30 ENCOUNTER — Encounter: Payer: Self-pay | Admitting: Vascular Surgery

## 2023-07-30 LAB — SURGICAL PATHOLOGY

## 2023-07-30 NOTE — Anesthesia Postprocedure Evaluation (Signed)
 Anesthesia Post Note  Patient: Jonathan [name]  Procedure(s) Performed: REPAIR FALSE ANEURYSM (RADIAL ARTERY ANEURYSM REPAIR) (Right)  Patient location during evaluation: PACU Anesthesia Type: General Level of consciousness: awake and alert Pain management: pain level controlled Vital Signs Assessment: post-procedure vital signs reviewed and stable Respiratory status: spontaneous breathing, nonlabored ventilation, respiratory function stable and patient connected to nasal cannula oxygen Cardiovascular status: blood pressure returned to baseline and stable Postop Assessment: no apparent nausea or vomiting Anesthetic complications: no   No notable events documented.   Last Vitals:  Vitals:   07/29/23 1507 07/29/23 1519  BP: (!) 141/71 (!) 156/73  Pulse: 69 (!) 53  Resp: 16 16  Temp: (!) 36.2 C   SpO2: 98% 100%    Last Pain:  Vitals:   07/29/23 1519  TempSrc:   PainSc: 0-No pain                 Prentice Murphy

## 2023-08-12 ENCOUNTER — Encounter (INDEPENDENT_AMBULATORY_CARE_PROVIDER_SITE_OTHER): Payer: Self-pay | Admitting: Nurse Practitioner

## 2023-08-12 ENCOUNTER — Ambulatory Visit (INDEPENDENT_AMBULATORY_CARE_PROVIDER_SITE_OTHER): Payer: PPO | Admitting: Nurse Practitioner

## 2023-08-12 VITALS — BP 114/68 | HR 57 | Resp 18 | Ht 69.0 in | Wt 191.0 lb

## 2023-08-12 DIAGNOSIS — I729 Aneurysm of unspecified site: Secondary | ICD-10-CM

## 2023-08-13 NOTE — Progress Notes (Signed)
Subjective:    Patient ID: Tereasa Coop, male    DOB: 1933-12-16, 89 y.o.   MRN: [id number] Chief Complaint  Patient presents with   Follow-up    2 weeks (08/12/2023); For wound re-check    The patient is an 89 year old male who presents today following repair of a pseudoaneurysm of the right radial artery including:  PROCEDURE: 1.   Repair of right radial artery aneurysm with ligation proximally and distally and excision of the aneurysm   The patient is doing fairly well.  He denies any significant pain post intervention.  His hands are cold but he notes this is actually consistent for him given his use of Eliquis.  He denies any symptoms of steal syndrome.  There is no numbness of his thumb or forearm.  He has some scabbing over the incision but no significant bruising or swelling of the upper extremity.  Overall he is progressing very well.    Review of Systems  Hematological:  Bruises/bleeds easily.  All other systems reviewed and are negative.      Objective:   Physical Exam Vitals reviewed.  HENT:     Head: Normocephalic.  Cardiovascular:     Rate and Rhythm: Normal rate.  Pulmonary:     Effort: Pulmonary effort is normal.  Skin:    General: Skin is warm and dry.     Coloration: Skin is jaundiced.  Neurological:     Mental Status: He is alert and oriented to person, place, and time.  Psychiatric:        Mood and Affect: Mood normal.        Behavior: Behavior normal.        Thought Content: Thought content normal.        Judgment: Judgment normal.     BP 114/68   Pulse (!) 57   Resp 18   Ht 5\' 9"  (1.753 m)   Wt 191 lb (86.6 kg)   BMI 28.21 kg/m   Past Medical History:  Diagnosis Date   Acute pericarditis    a. 05/2023-->colchicine x 3 mos.   CAD (coronary artery disease)    a. 05/2023 PCI: LM nl, LAD 80p (2.75x12 Onyx DES), LCX 49m, RCA 70ost/p.   Hypercholesteremia    Hypertension    Mitral regurgitation    a. 02/2023 Echo: EF 60-65%, no  rwma, mild LVH, nl RV fxn, RVSP 39.43mmHg, mod dil LA, mod MR, mild-mod TR, AoV sclerosis.   Permanent atrial fibrillation (HCC)    a. CHA2DS2VASc = 6-->eliquis.   Pre-diabetes    Prostate cancer (HCC)    Stroke [facility])     Social History   Socioeconomic History   Marital status: Married    Spouse name: Arna Medici   Number of children: Not on file   Years of education: Not on file   Highest education level: Not on file  Occupational History   Not on file  Tobacco Use   Smoking status: Former    Types: Cigars    Quit date: 1994    Years since quitting: 31.1   Smokeless tobacco: Never  Vaping Use   Vaping status: Never Used  Substance and Sexual Activity   Alcohol use: Yes    Comment: occasional/2 beers a month   Drug use: Never   Sexual activity: Yes  Other Topics Concern   Not on file  Social History Narrative   Not on file   Social Drivers of Health   Financial Resource Strain: Low  Risk  (07/10/2023)   Received from [facility]   Overall Financial Resource Strain (CARDIA)    Difficulty of Paying Living Expenses: Not hard at all  Food Insecurity: No Food Insecurity (07/10/2023)   Received from [facility]   Hunger Vital Sign    Worried About Running Out of Food in the Last Year: Never true    Ran Out of Food in the Last Year: Never true  Transportation Needs: No Transportation Needs (07/10/2023)   Received from [facility]   PRAPARE - Transportation    Lack of Transportation (Non-Medical): No    In the past 12 months, has lack of transportation kept you from medical appointments or from getting medications?: No  Physical Activity: Not on file  Stress: Not on file  Social Connections: Moderately Integrated (06/25/2023)   Social Connection and Isolation Panel [NHANES]    Frequency of Communication with Friends and Family: More than three times a week    Frequency of Social Gatherings with Friends and Family: Three times  a week    Attends Religious Services: More than 4 times per year    Active Member of Clubs or Organizations: Yes    Attends Banker Meetings: More than 4 times per year    Marital Status: Widowed  Intimate Partner Violence: Not At Risk (06/22/2023)   Humiliation, Afraid, Rape, and Kick questionnaire    Fear of Current or Ex-Partner: No    Emotionally Abused: No    Physically Abused: No    Sexually Abused: No    Past Surgical History:  Procedure Laterality Date   CORONARY STENT INTERVENTION N/A 06/25/2023   Procedure: CORONARY STENT INTERVENTION;  Surgeon: Yvonne Kendall, MD;  Location: [facility] INVASIVE CV LAB;  Service: Cardiovascular;  Laterality: N/A;   CORONARY/GRAFT ACUTE MI REVASCULARIZATION N/A 06/22/2023   Procedure: Coronary/Graft Acute MI Revascularization;  Surgeon: Tonny Bollman, MD;  Location: [facility] INVASIVE CV LAB;  Service: Cardiovascular;  Laterality: N/A;   FALSE ANEURYSM REPAIR Right 07/29/2023   Procedure: REPAIR FALSE ANEURYSM (RADIAL ARTERY ANEURYSM REPAIR);  Surgeon: Annice Needy, MD;  Location: [facility] ORS;  Service: Vascular;  Laterality: Right;   LEFT HEART CATH AND CORONARY ANGIOGRAPHY N/A 06/22/2023   Procedure: LEFT HEART CATH AND CORONARY ANGIOGRAPHY;  Surgeon: Tonny Bollman, MD;  Location: [facility] INVASIVE CV LAB;  Service: Cardiovascular;  Laterality: N/A;   LEFT HEART CATH AND CORONARY ANGIOGRAPHY N/A 06/25/2023   Procedure: LEFT HEART CATH AND CORONARY ANGIOGRAPHY;  Surgeon: Yvonne Kendall, MD;  Location: [facility] INVASIVE CV LAB;  Service: Cardiovascular;  Laterality: N/A;   PROSTATE SURGERY     SKIN BIOPSY     basel cell    Family History  Problem Relation Age of Onset   Heart disease Maternal Uncle    Heart disease Maternal Grandfather     Allergies  Allergen Reactions   Penicillin V Potassium Other (See Comments)   Penicillins Rash       Latest Ref Rng & Units 06/26/2023    5:34 AM 06/25/2023    3:26 AM 06/24/2023    3:53 AM  CBC   WBC 4.0 - 10.5 K/uL 5.6  6.1  6.1   Hemoglobin 13.0 - 17.0 g/dL 16.1  09.6  04.5   Hematocrit 39.0 - 52.0 % 34.3  34.0  32.7   Platelets 150 - 400 K/uL 216  211  169       CMP     Component Value Date/Time  NA 140 06/26/2023 0534   NA 141 07/01/2017 0951   K 3.9 06/26/2023 0534   CL 110 06/26/2023 0534   CO2 21 (L) 06/26/2023 0534   GLUCOSE 105 (H) 06/26/2023 0534   BUN 20 06/26/2023 0534   BUN 12 07/01/2017 0951   CREATININE 1.38 (H) 06/26/2023 0534   CALCIUM 8.5 (L) 06/26/2023 0534   PROT 7.3 03/01/2023 1503   PROT 7.1 07/01/2017 0951   ALBUMIN 4.2 03/01/2023 1503   ALBUMIN 4.7 07/01/2017 0951   AST 16 03/01/2023 1503   ALT 14 03/01/2023 1503   ALKPHOS 99 03/01/2023 1503   BILITOT 1.4 (H) 03/01/2023 1503   BILITOT 0.7 07/01/2017 0951   GFRNONAA 49 (L) 06/26/2023 0534     No results found.     Assessment & Plan:   1. Pseudoaneurysm of vascular access site [facility]) (Primary) The patient's incision site is doing well.  There is a small area of Dermabond still left in place with some associated areas of scabbing.  There is no swelling.  Overall he is progressing well.  The patient is advised to contact us if he has any issues with ongoing wound healing otherwise he will follow-up as needed.   Current Outpatient Medications on File Prior to Visit  Medication Sig Dispense Refill   acetaminophen (TYLENOL) 500 MG tablet Take 1 tablet (500 mg total) by mouth every 6 (six) hours as needed. 30 tablet 0   atorvastatin (LIPITOR) 80 MG tablet Take 1 tablet (80 mg total) by mouth daily. 90 tablet 1   Cholecalciferol (VITAMIN D) 2000 units CAPS Take 1 capsule (2,000 Units total) by mouth daily. 30 capsule    clopidogrel (PLAVIX) 75 MG tablet Take 1 tablet (75 mg total) by mouth daily. 90 tablet 1   colchicine 0.6 MG tablet Take 1 tablet (0.6 mg total) by mouth daily. 90 tablet 0   Cyanocobalamin (B-12) 500 MCG TABS Take 1 tablet by mouth daily. 150 tablet     HYDROcodone-acetaminophen (NORCO/VICODIN) 5-325 MG tablet Take 2 tablets by mouth every 6 (six) hours as needed for moderate pain (pain score 4-6). 20 tablet 0   lisinopril (ZESTRIL) 20 MG tablet Take 1 tablet (20 mg total) by mouth daily. 90 tablet 3   metoprolol succinate (TOPROL XL) 25 MG 24 hr tablet Take 1 tablet (25 mg total) by mouth daily. 90 tablet 3   Rivaroxaban (XARELTO) 15 MG TABS tablet Take 1 tablet (15 mg total) by mouth daily with supper. 90 tablet 1   Triamcinolone Acetonide (NASACORT ALLERGY 24HR NA) Place into the nose.     No current facility-administered medications on file prior to visit.    There are no Patient Instructions on file for this visit. No follow-ups on file.   Georgiana Spinner, NP

## 2023-08-21 ENCOUNTER — Ambulatory Visit: Payer: PPO | Attending: Nurse Practitioner | Admitting: Nurse Practitioner

## 2023-08-21 ENCOUNTER — Encounter: Payer: Self-pay | Admitting: Nurse Practitioner

## 2023-08-21 VITALS — BP 130/62 | HR 59 | Ht 69.0 in | Wt 199.8 lb

## 2023-08-21 DIAGNOSIS — I300 Acute nonspecific idiopathic pericarditis: Secondary | ICD-10-CM | POA: Diagnosis not present

## 2023-08-21 DIAGNOSIS — I4821 Permanent atrial fibrillation: Secondary | ICD-10-CM

## 2023-08-21 DIAGNOSIS — E785 Hyperlipidemia, unspecified: Secondary | ICD-10-CM | POA: Diagnosis not present

## 2023-08-21 DIAGNOSIS — I10 Essential (primary) hypertension: Secondary | ICD-10-CM

## 2023-08-21 DIAGNOSIS — I2510 Atherosclerotic heart disease of native coronary artery without angina pectoris: Secondary | ICD-10-CM | POA: Diagnosis not present

## 2023-08-21 DIAGNOSIS — I721 Aneurysm of artery of upper extremity: Secondary | ICD-10-CM

## 2023-08-21 NOTE — Progress Notes (Addendum)
 Office Visit    Patient Name: Jonathan [name] Date of Encounter: 08/21/2023  Primary Care Provider:  Marina Goodell, MD Primary Cardiologist:  Debbe Odea, MD  Chief Complaint    89 y.o. male with a history of permanent atrial fibrillation, hypertension, hyperlipidemia, prediabetes, stroke, and CAD, who presents for follow-up after hospitalization in late December for pericarditis and CAD requiring PCI and pseudoaneurysm found on previous clinic visit.   Past Medical History  Subjective   Past Medical History:  Diagnosis Date   Acute pericarditis    a. 05/2023-->colchicine x 3 mos.   CAD (coronary artery disease)    a. 05/2023 PCI: LM nl, LAD 80p (2.75x12 Onyx DES), LCX 49m, RCA 70ost/p.   Hypercholesteremia    Hypertension    Mitral regurgitation    a. 02/2023 Echo: EF 60-65%, no rwma, mild LVH, nl RV fxn, RVSP 39.10mmHg, mod dil LA, mod MR, mild-mod TR, AoV sclerosis.   Permanent atrial fibrillation (HCC)    a. CHA2DS2VASc = 6-->eliquis.   Pre-diabetes    Prostate cancer (HCC)    Pseudoaneurysm of R Radial Artery    a. 06/2023 following PCI; b. 07/2023 s/p surgical repair.   Stroke [facility])    Past Surgical History:  Procedure Laterality Date   CORONARY STENT INTERVENTION N/A 06/25/2023   Procedure: CORONARY STENT INTERVENTION;  Surgeon: Yvonne Kendall, MD;  Location: [facility] INVASIVE CV LAB;  Service: Cardiovascular;  Laterality: N/A;   CORONARY/GRAFT ACUTE MI REVASCULARIZATION N/A 06/22/2023   Procedure: Coronary/Graft Acute MI Revascularization;  Surgeon: Tonny Bollman, MD;  Location: [facility] INVASIVE CV LAB;  Service: Cardiovascular;  Laterality: N/A;   FALSE ANEURYSM REPAIR Right 07/29/2023   Procedure: REPAIR FALSE ANEURYSM (RADIAL ARTERY ANEURYSM REPAIR);  Surgeon: Annice Needy, MD;  Location: [facility] ORS;  Service: Vascular;  Laterality: Right;   LEFT HEART CATH AND CORONARY ANGIOGRAPHY N/A 06/22/2023   Procedure: LEFT HEART CATH AND CORONARY ANGIOGRAPHY;   Surgeon: Tonny Bollman, MD;  Location: [facility] INVASIVE CV LAB;  Service: Cardiovascular;  Laterality: N/A;   LEFT HEART CATH AND CORONARY ANGIOGRAPHY N/A 06/25/2023   Procedure: LEFT HEART CATH AND CORONARY ANGIOGRAPHY;  Surgeon: Yvonne Kendall, MD;  Location: [facility] INVASIVE CV LAB;  Service: Cardiovascular;  Laterality: N/A;   PROSTATE SURGERY     SKIN BIOPSY     basel cell    Allergies  Allergies  Allergen Reactions   Penicillin V Potassium Other (See Comments)   Penicillins Rash      History of Present Illness      89 y.o. y/o male with a history of permanent atrial fibrillation, hypertension, hyperlipidemia, prediabetes, stroke, pericarditis, and CAD.  Patient was admitted to [facility] regional in late December 2024, with complaints of chest pain and ST segment elevation.  He underwent diagnostic catheterization which revealed severe proximal LAD and RCA disease without evidence of plaque rupture or thrombus.  Troponins also returned normal.  Presentation was more consistent with pericarditis, and intervention was deferred, and he was placed on colchicine therapy.  Echocardiogram showed normal LV function without effusion.  He had severe biatrial enlargement in the setting of permanent A-fib.  Chest pain improved on colchicine.  He underwent repeat catheterization and PCI on June 25, 2023, with drug-eluting stent placement to the proximal LAD.  The RCA was medically managed.  He was discharged home in January 1 on Plavix (at least 6 preferably 12 months) and Eliquis as well as colchicine x 3 months.  In January, he returned  to the office with a bulging over his right radial catheterization site.  Ultrasound showed a 2 cm x 1 cm pseudoaneurysm with a fairly large mouth.  He underwent vascular surgery on 2/4 without complications.      He reports that since his last visit he has been doing well. He does state to still have metallic taste in his mouth and his appetite has returned since  switching from Eliquis to Xarelto. The metallic taste is better but still not at baseline. He wants to switch back to eliquis if these issues don't completely resolve in 4 weeks. He also is eager to get active again. He wants to start back cardiac rehabilitation and going to the gym. He denies chest pain, dyspnea, palpitations, PND, orthopnea, dizziness, syncope, or early satiety.   Objective  Home Medications    Current Outpatient Medications  Medication Sig Dispense Refill   acetaminophen (TYLENOL) 500 MG tablet Take 1 tablet (500 mg total) by mouth every 6 (six) hours as needed. 30 tablet 0   atorvastatin (LIPITOR) 80 MG tablet Take 1 tablet (80 mg total) by mouth daily. 90 tablet 1   Cholecalciferol (VITAMIN D) 2000 units CAPS Take 1 capsule (2,000 Units total) by mouth daily. 30 capsule    clopidogrel (PLAVIX) 75 MG tablet Take 1 tablet (75 mg total) by mouth daily. 90 tablet 1   colchicine 0.6 MG tablet Take 1 tablet (0.6 mg total) by mouth daily. 90 tablet 0   Cyanocobalamin (B-12) 500 MCG TABS Take 1 tablet by mouth daily. 150 tablet    lisinopril (ZESTRIL) 20 MG tablet Take 1 tablet (20 mg total) by mouth daily. 90 tablet 3   metoprolol succinate (TOPROL XL) 25 MG 24 hr tablet Take 1 tablet (25 mg total) by mouth daily. 90 tablet 3   Rivaroxaban (XARELTO) 15 MG TABS tablet Take 1 tablet (15 mg total) by mouth daily with supper. 90 tablet 1   Triamcinolone Acetonide (NASACORT ALLERGY 24HR NA) Place into the nose.     No current facility-administered medications for this visit.     Physical Exam    VS:  BP 130/62   Pulse (!) 59   Ht 5\' 9"  (1.753 m)   Wt 199 lb 12.8 oz (90.6 kg)   SpO2 98%   BMI 29.51 kg/m  , BMI Body mass index is 29.51 kg/m.       Cardiac Rehabilitation Eligibility Assessment     GEN: Well nourished, well developed, in no acute distress. HEENT: normal. Neck: Supple, no JVD, carotid bruits, or masses. Cardiac: RRR, no murmurs, rubs, or gallops. No  clubbing or cyanosis.  R radial surgical site w/ well-healing incision.  No bleeding/bruit. Radial pulse nonpalpable. Trace B/L ankle edema. Respiratory:  Respirations regular and unlabored, clear to auscultation bilaterally. GI: Soft, nontender, nondistended, BS + x 4. MS: no deformity or atrophy. Skin: warm and dry, no rash. Neuro:  Strength and sensation are intact. Psych: Normal affect.  Accessory Clinical Findings   Lab Results  Component Value Date   WBC 5.6 06/26/2023   HGB 11.4 (L) 06/26/2023   HCT 34.3 (L) 06/26/2023   MCV 92.0 06/26/2023   PLT 216 06/26/2023   Lab Results  Component Value Date   CREATININE 1.38 (H) 06/26/2023   BUN 20 06/26/2023   NA 140 06/26/2023   K 3.9 06/26/2023   CL 110 06/26/2023   CO2 21 (L) 06/26/2023   Lab Results  Component Value Date   ALT 14  03/01/2023   AST 16 03/01/2023   ALKPHOS 99 03/01/2023   BILITOT 1.4 (H) 03/01/2023   Lab Results  Component Value Date   CHOL 152 06/22/2023   HDL 63 06/22/2023   LDLCALC 78 06/22/2023   TRIG 56 06/22/2023   CHOLHDL 2.4 06/22/2023    Lab Results  Component Value Date   HGBA1C 6.0 (H) 06/22/2023   Lab Results  Component Value Date   TSH 1.700 06/08/2016       Assessment & Plan    1.  Acute pericarditis: Patient admitted in December with chest pain and EKG abnormalities with diffuse ST segment elevation.  It was initially called as a STEMI.  Catheterization revealed severe proximal LAD and moderate RCA disease.  Though he subsequently underwent staged intervention of the LAD, he was treated for pericarditis with significant improvement in symptoms with colchicine therapy.  Echo during admission showed normal LV function without any effusion.  He has not had recurrent chest pain and remains on colchicine 0.6 mg daily.  He will complete a 53-month course. Has one more month remaining.    2. Coronary artery disease: As above, status post LAD stenting. He has residual proximal RCA disease.  He has not been having any chest pain. He remains on statin, Plavix, beta-blocker, and ACE inhibitor. No aspirin in the setting of DOAC.   3. Right radial artery pseudoaneurysm: Patient had pulsatile bulging over his right radial artery on last visit in January. Ultrasound in office showed 2 cm x 1 cm pseudoaneurysm with a fairly large mouth. Patient had vascular surgery on 2/4 without complications. Scar looked intact and without complications on visit today.   4. Permanent atrial fibrillation: He remains asymptomatic and well rate controlled on beta-blocker therapy. At last visit, patient switched to Xarelto 15mg , as Eliquis has altered his taste and made his life miserable. (creatinine clearance less than 50). Since, he still has an altered taste but it has gotten better. His appetite is back. Patient states to want to try to go back to Eliquis in 4 weeks if the symptoms don't fully get back to normal. He will reach back out to the office if he wants to change back.  5. Primary hypertension: Well-controlled on beta-blocker and ACE inhibitor therapy.   6. Hyperlipidemia: LDL of 78 in December. He is on atorvastatin 80 mg. Patient states to be getting his labs taken next week with primary care. Will let them make adjustments if needed.  7. Disposition: Patient to follow up here in 3 months or sooner if necessary.    Nicolasa Ducking, NP 08/21/2023, 10:00 AM

## 2023-08-21 NOTE — Patient Instructions (Signed)
 Medication Instructions:  No changes *If you need a refill on your cardiac medications before your next appointment, please call your pharmacy*   Lab Work: None ordered If you have labs (blood work) drawn today and your tests are completely normal, you will receive your results only by: MyChart Message (if you have MyChart) OR A paper copy in the mail If you have any lab test that is abnormal or we need to change your treatment, we will call you to review the results.   Testing/Procedures: None ordered   Follow-Up: At [facility], you and your health needs are our priority.  As part of our continuing mission to provide you with exceptional heart care, we have created designated Provider Care Teams.  These Care Teams include your primary Cardiologist (physician) and Advanced Practice Providers (APPs -  Physician Assistants and Nurse Practitioners) who all work together to provide you with the care you need, when you need it.  We recommend signing up for the patient portal called "MyChart".  Sign up information is provided on this After Visit Summary.  MyChart is used to connect with patients for Virtual Visits (Telemedicine).  Patients are able to view lab/test results, encounter notes, upcoming appointments, etc.  Non-urgent messages can be sent to your provider as well.   To learn more about what you can do with MyChart, go to [email or web address].    Your next appointment:   3 month(s)  Provider:   You may see Debbe Odea, MD or one of the following Advanced Practice Providers on your designated Care Team:   Nicolasa Ducking, NP

## 2023-08-27 DIAGNOSIS — E66811 Obesity, class 1: Secondary | ICD-10-CM | POA: Diagnosis not present

## 2023-08-27 DIAGNOSIS — I10 Essential (primary) hypertension: Secondary | ICD-10-CM | POA: Diagnosis not present

## 2023-08-27 DIAGNOSIS — E119 Type 2 diabetes mellitus without complications: Secondary | ICD-10-CM | POA: Diagnosis not present

## 2023-08-27 DIAGNOSIS — I2510 Atherosclerotic heart disease of native coronary artery without angina pectoris: Secondary | ICD-10-CM | POA: Diagnosis not present

## 2023-08-27 DIAGNOSIS — J309 Allergic rhinitis, unspecified: Secondary | ICD-10-CM | POA: Diagnosis not present

## 2023-08-27 DIAGNOSIS — N1831 Chronic kidney disease, stage 3a: Secondary | ICD-10-CM | POA: Diagnosis not present

## 2023-08-27 DIAGNOSIS — I4891 Unspecified atrial fibrillation: Secondary | ICD-10-CM | POA: Diagnosis not present

## 2023-08-27 DIAGNOSIS — E7800 Pure hypercholesterolemia, unspecified: Secondary | ICD-10-CM | POA: Diagnosis not present

## 2023-08-27 DIAGNOSIS — I309 Acute pericarditis, unspecified: Secondary | ICD-10-CM | POA: Diagnosis not present

## 2023-09-10 ENCOUNTER — Ambulatory Visit: Payer: PPO | Admitting: Cardiology

## 2023-09-11 DIAGNOSIS — R252 Cramp and spasm: Secondary | ICD-10-CM | POA: Diagnosis not present

## 2023-09-11 DIAGNOSIS — M79605 Pain in left leg: Secondary | ICD-10-CM | POA: Diagnosis not present

## 2023-09-30 DIAGNOSIS — R748 Abnormal levels of other serum enzymes: Secondary | ICD-10-CM | POA: Diagnosis not present

## 2023-10-22 ENCOUNTER — Other Ambulatory Visit (INDEPENDENT_AMBULATORY_CARE_PROVIDER_SITE_OTHER): Payer: Self-pay

## 2023-10-22 ENCOUNTER — Telehealth (INDEPENDENT_AMBULATORY_CARE_PROVIDER_SITE_OTHER): Payer: Self-pay | Admitting: Vascular Surgery

## 2023-10-22 NOTE — Telephone Encounter (Signed)
 Patient has been scheduled for bilateral GSV laser ablations with Dr. Vonna Guardian. The first is 6.13.25 and the second is 7.8.25. Patient will need the standard protocol RX sent to [facility] in [place]. Thank you!

## 2023-10-22 NOTE — Telephone Encounter (Signed)
 Sent!

## 2023-10-22 NOTE — Telephone Encounter (Signed)
 Xanax for laser on 12/06/23 and 12/31/23

## 2023-10-23 MED ORDER — ALPRAZOLAM 0.5 MG PO TABS: ORAL_TABLET | ORAL | 0 refills | Status: AC

## 2023-11-04 ENCOUNTER — Telehealth (INDEPENDENT_AMBULATORY_CARE_PROVIDER_SITE_OTHER): Payer: Self-pay | Admitting: Vascular Surgery

## 2023-11-04 NOTE — Telephone Encounter (Signed)
 LVM for pt advising that we will need to R/S the 7.8.25 laser ablation appt due to Dr. Vonna Guardian being out of the office on call. I will be calling [employer] to extend the prior auth expiration date.

## 2023-11-12 ENCOUNTER — Encounter (INDEPENDENT_AMBULATORY_CARE_PROVIDER_SITE_OTHER): Payer: Self-pay

## 2023-11-19 ENCOUNTER — Encounter: Payer: Self-pay | Admitting: Cardiology

## 2023-11-19 ENCOUNTER — Ambulatory Visit: Payer: Self-pay | Attending: Cardiology | Admitting: Cardiology

## 2023-11-19 VITALS — BP 130/70 | HR 57 | Ht 69.0 in | Wt 198.0 lb

## 2023-11-19 DIAGNOSIS — I2510 Atherosclerotic heart disease of native coronary artery without angina pectoris: Secondary | ICD-10-CM | POA: Diagnosis not present

## 2023-11-19 DIAGNOSIS — I10 Essential (primary) hypertension: Secondary | ICD-10-CM | POA: Diagnosis not present

## 2023-11-19 DIAGNOSIS — I4821 Permanent atrial fibrillation: Secondary | ICD-10-CM | POA: Diagnosis not present

## 2023-11-19 NOTE — Progress Notes (Signed)
 Cardiology Office Note:    Date:  11/19/2023   ID:  Jonathan [name], DOB 1933/06/29, MRN [id number]  PCP:  Lorrie Rothman, MD   [facility] Providers Cardiologist:  Constancia Delton, MD     Referring MD: Lorrie Rothman, MD   Chief Complaint  Patient presents with   6 month follow up     "Doing well."     History of Present Illness:    Jonathan [name] is a 89 y.o. male with a hx of CAD s/p PCI to proximal LAD 05/2023 (70% proximal RCA, 40% mid Lcx), hypertension, hyperlipidemia, CVA, prostate cancer presenting for follow-up.  Endorses easy bruisability while on Xarelto .  Xarelto  switched to Eliquis  with improvement in bruising.  Denies chest pain.  Very active with playing golf, walking, also goes to the gym.  Compliant with meds as prescribed.  Endorse having a metallic taste since having a stroke in January 2025.  Denies palpitations.  Prior notes/testing Echocardiogram 02/2023 showed EF 60 to 65%, moderately dilated left atrium. Carotid ultrasound showed moderate right and left carotid atherosclerosis, no hemodynamic significant stenosis.  Past Medical History:  Diagnosis Date   Acute pericarditis    a. 05/2023-->colchicine  x 3 mos.   CAD (coronary artery disease)    a. 05/2023 PCI: LM nl, LAD 80p (2.75x12 Onyx DES), LCX 75m, RCA 70ost/p.   Hypercholesteremia    Hypertension    Mitral regurgitation    a. 02/2023 Echo: EF 60-65%, no rwma, mild LVH, nl RV fxn, RVSP 39.51mmHg, mod dil LA, mod MR, mild-mod TR, AoV sclerosis.   Permanent atrial fibrillation (HCC)    a. CHA2DS2VASc = 6-->eliquis .   Pre-diabetes    Prostate cancer (HCC)    Pseudoaneurysm of R Radial Artery    a. 06/2023 following PCI; b. 07/2023 s/p surgical repair.   Stroke [facility])     Past Surgical History:  Procedure Laterality Date   CORONARY STENT INTERVENTION N/A 06/25/2023   Procedure: CORONARY STENT INTERVENTION;  Surgeon: Sammy Crisp, MD;  Location: [facility] INVASIVE CV  LAB;  Service: Cardiovascular;  Laterality: N/A;   CORONARY/GRAFT ACUTE MI REVASCULARIZATION N/A 06/22/2023   Procedure: Coronary/Graft Acute MI Revascularization;  Surgeon: Arnoldo Lapping, MD;  Location: [facility] INVASIVE CV LAB;  Service: Cardiovascular;  Laterality: N/A;   FALSE ANEURYSM REPAIR Right 07/29/2023   Procedure: REPAIR FALSE ANEURYSM (RADIAL ARTERY ANEURYSM REPAIR);  Surgeon: Celso College, MD;  Location: [facility] ORS;  Service: Vascular;  Laterality: Right;   LEFT HEART CATH AND CORONARY ANGIOGRAPHY N/A 06/22/2023   Procedure: LEFT HEART CATH AND CORONARY ANGIOGRAPHY;  Surgeon: Arnoldo Lapping, MD;  Location: [facility] INVASIVE CV LAB;  Service: Cardiovascular;  Laterality: N/A;   LEFT HEART CATH AND CORONARY ANGIOGRAPHY N/A 06/25/2023   Procedure: LEFT HEART CATH AND CORONARY ANGIOGRAPHY;  Surgeon: Sammy Crisp, MD;  Location: [facility] INVASIVE CV LAB;  Service: Cardiovascular;  Laterality: N/A;   PROSTATE SURGERY     SKIN BIOPSY     basel cell    Current Medications: Current Meds  Medication Sig   acetaminophen  (TYLENOL ) 500 MG tablet Take 1 tablet (500 mg total) by mouth every 6 (six) hours as needed.   atorvastatin  (LIPITOR ) 40 MG tablet Take 40 mg by mouth daily.   Cholecalciferol (VITAMIN D ) 2000 units CAPS Take 1 capsule (2,000 Units total) by mouth daily.   clopidogrel  (PLAVIX ) 75 MG tablet Take 1 tablet (75 mg total) by mouth daily.   Cyanocobalamin (B-12) 500 MCG TABS Take 1  tablet by mouth daily.   ELIQUIS  5 MG TABS tablet Take 5 mg by mouth 2 (two) times daily.   lisinopril  (ZESTRIL ) 20 MG tablet Take 1 tablet (20 mg total) by mouth daily.   metoprolol  succinate (TOPROL  XL) 25 MG 24 hr tablet Take 1 tablet (25 mg total) by mouth daily.   Triamcinolone Acetonide (NASACORT ALLERGY 24HR NA) Place into the nose.     Allergies:   Penicillin v potassium and Penicillins   Social History   Socioeconomic History   Marital status: Married    Spouse name: Nicholes Barks   Number of  children: Not on file   Years of education: Not on file   Highest education level: Not on file  Occupational History   Not on file  Tobacco Use   Smoking status: Former    Types: Cigars    Quit date: 1994    Years since quitting: 31.4   Smokeless tobacco: Never  Vaping Use   Vaping status: Never Used  Substance and Sexual Activity   Alcohol use: Yes    Comment: occasional/2 beers a month   Drug use: Never   Sexual activity: Yes  Other Topics Concern   Not on file  Social History Narrative   Not on file   Social Drivers of Health   Financial Resource Strain: Low Risk  (07/10/2023)   [facility]   Overall Financial Resource Strain (CARDIA)    Difficulty of Paying Living Expenses: Not hard at all  Food Insecurity: No Food Insecurity (07/10/2023)   Received from [facility]   Hunger Vital Sign    Worried About Running Out of Food in the Last Year: Never true    Ran Out of Food in the Last Year: Never true  Transportation Needs: No Transportation Needs (07/10/2023)   Received from [facility]   PRAPARE - Transportation    Lack of Transportation (Non-Medical): No    In the past 12 months, has lack of transportation kept you from medical appointments or from getting medications?: No  Physical Activity: Not on file  Stress: Not on file  Social Connections: Moderately Integrated (06/25/2023)   Social Connection and Isolation Panel [NHANES]    Frequency of Communication with Friends and Family: More than three times a week    Frequency of Social Gatherings with Friends and Family: Three times a week    Attends Religious Services: More than 4 times per year    Active Member of Clubs or Organizations: Yes    Attends Banker Meetings: More than 4 times per year    Marital Status: Widowed     Family History: The patient's family history includes Heart disease in his maternal grandfather and maternal  uncle.  ROS:   Please see the history of present illness.     All other systems reviewed and are negative.  EKGs/Labs/Other Studies Reviewed:    The following studies were reviewed today:  EKG Interpretation Date/Time:  Tuesday Nov 19 2023 08:19:58 EDT Ventricular Rate:  57 PR Interval:    QRS Duration:  106 QT Interval:  438 QTC Calculation: 426 R Axis:   -14  Text Interpretation: Atrial fibrillation with slow ventricular response Incomplete right bundle branch block Confirmed by Constancia Delton ([id number]) on 11/19/2023 8:36:46 AM    Recent Labs: 03/01/2023: ALT 14 06/26/2023: BUN 20; Creatinine, Ser 1.38; Hemoglobin 11.4; Magnesium 2.0; Platelets 216; Potassium 3.9; Sodium 140  Recent Lipid Panel  Component Value Date/Time   CHOL 152 06/22/2023 0940   CHOL 250 (H) 07/01/2017 0951   TRIG 56 06/22/2023 0940   HDL 63 06/22/2023 0940   HDL 50 07/01/2017 0951   CHOLHDL 2.4 06/22/2023 0940   VLDL 11 06/22/2023 0940   LDLCALC 78 06/22/2023 0940   LDLCALC 166 (H) 07/01/2017 0951     Risk Assessment/Calculations:             Physical Exam:    VS:  BP 130/70 (BP Location: Left Arm, Patient Position: Sitting, Cuff Size: Normal)   Pulse (!) 57   Ht 5\' 9"  (1.753 m)   Wt 198 lb (89.8 kg)   SpO2 98%   BMI 29.24 kg/m     Wt Readings from Last 3 Encounters:  11/19/23 198 lb (89.8 kg)  08/21/23 199 lb 12.8 oz (90.6 kg)  08/12/23 191 lb (86.6 kg)     GEN:  Well nourished, well developed in no acute distress HEENT: Normal NECK: No JVD; No carotid bruits CARDIAC: RRR, no murmurs, rubs, gallops RESPIRATORY:  Clear to auscultation without rales, wheezing or rhonchi  ABDOMEN: Soft, non-tender, non-distended MUSCULOSKELETAL:  trace edema; No deformity  SKIN: Warm and dry NEUROLOGIC:  Alert and oriented x 3 PSYCHIATRIC:  Normal affect   ASSESSMENT:    1. Coronary artery disease involving native coronary artery of native heart, unspecified whether angina present   2.  Permanent atrial fibrillation (HCC)   3. Primary hypertension    PLAN:    In order of problems listed above:  CAD s/p PCI proximal LAD 12/24.  Proximal RCA 70% stenosed, not intervened, high risk.  Denies chest pain.  Continue Plavix , Eliquis , Lipitor  40. Paroxysmal atrial fibrillation, recent stroke.  Continue Eliquis ,Toprol -XL 25 mg daily.  Hypertension, BP controlled, continue lisinopril  20 mg daily, Toprol -XL 25 mg daily.  EF 60%, moderate LAE.  Follow-up in 12 months    Cardiac Rehabilitation Eligibility Assessment      Medication Adjustments/Labs and Tests Ordered: Current medicines are reviewed at length with the patient today.  Concerns regarding medicines are outlined above.  Orders Placed This Encounter  Procedures   EKG 12-Lead   No orders of the defined types were placed in this encounter.   Patient Instructions  Medication Instructions:  Your Physician recommend you continue on your current medication as directed.    *If you need a refill on your cardiac medications before your next appointment, please call your pharmacy*  Lab Work: No labs ordered today  If you have labs (blood work) drawn today and your tests are completely normal, you will receive your results only by: MyChart Message (if you have MyChart) OR A paper copy in the mail If you have any lab test that is abnormal or we need to change your treatment, we will call you to review the results.  Testing/Procedures: No test ordered today   Follow-Up: At [facility], you and your health needs are our priority.  As part of our continuing mission to provide you with exceptional heart care, our providers are all part of one team.  This team includes your primary Cardiologist (physician) and Advanced Practice Providers or APPs (Physician Assistants and Nurse Practitioners) who all work together to provide you with the care you need, when you need it.  Your next appointment:   1  year(s)  Provider:   You may see Constancia Delton, MD or one of the following Advanced Practice Providers on your designated Care Team:  Laneta Pintos, NP Gildardo Labrador, PA-C Varney Gentleman, PA-C Cadence Gennaro Khat, PA-C Ronald Cockayne, NP Morey Ar, NP    We recommend signing up for the patient portal called "MyChart".  Sign up information is provided on this After Visit Summary.  MyChart is used to connect with patients for Virtual Visits (Telemedicine).  Patients are able to view lab/test results, encounter notes, upcoming appointments, etc.  Non-urgent messages can be sent to your provider as well.   To learn more about what you can do with MyChart, go to [email or web address].         Signed, Constancia Delton, MD  11/19/2023 10:16 AM    [facility]

## 2023-11-19 NOTE — Patient Instructions (Signed)

## 2023-11-28 DIAGNOSIS — I4891 Unspecified atrial fibrillation: Secondary | ICD-10-CM | POA: Diagnosis not present

## 2023-11-28 DIAGNOSIS — E119 Type 2 diabetes mellitus without complications: Secondary | ICD-10-CM | POA: Diagnosis not present

## 2023-11-28 DIAGNOSIS — I10 Essential (primary) hypertension: Secondary | ICD-10-CM | POA: Diagnosis not present

## 2023-11-28 DIAGNOSIS — I2510 Atherosclerotic heart disease of native coronary artery without angina pectoris: Secondary | ICD-10-CM | POA: Diagnosis not present

## 2023-11-28 DIAGNOSIS — N1831 Chronic kidney disease, stage 3a: Secondary | ICD-10-CM | POA: Diagnosis not present

## 2023-11-28 DIAGNOSIS — E7800 Pure hypercholesterolemia, unspecified: Secondary | ICD-10-CM | POA: Diagnosis not present

## 2023-12-06 ENCOUNTER — Encounter (INDEPENDENT_AMBULATORY_CARE_PROVIDER_SITE_OTHER): Payer: Self-pay | Admitting: Vascular Surgery

## 2023-12-06 ENCOUNTER — Other Ambulatory Visit: Payer: Self-pay | Admitting: Obstetrics and Gynecology

## 2023-12-06 ENCOUNTER — Ambulatory Visit (INDEPENDENT_AMBULATORY_CARE_PROVIDER_SITE_OTHER): Admitting: Vascular Surgery

## 2023-12-06 VITALS — BP 116/65 | HR 53 | Resp 18 | Ht 69.0 in | Wt 193.6 lb

## 2023-12-06 DIAGNOSIS — I83891 Varicose veins of right lower extremities with other complications: Secondary | ICD-10-CM

## 2023-12-06 NOTE — Progress Notes (Signed)
 Jonathan [name] is a 89 y.o. male who presents with symptomatic venous reflux  Past Medical History:  Diagnosis Date   Acute pericarditis    a. 05/2023-->colchicine  x 3 mos.   CAD (coronary artery disease)    a. 05/2023 PCI: LM nl, LAD 80p (2.75x12 Onyx DES), LCX 32m, RCA 70ost/p.   Hypercholesteremia    Hypertension    Mitral regurgitation    a. 02/2023 Echo: EF 60-65%, no rwma, mild LVH, nl RV fxn, RVSP 39.70mmHg, mod dil LA, mod MR, mild-mod TR, AoV sclerosis.   Permanent atrial fibrillation (HCC)    a. CHA2DS2VASc = 6-->eliquis .   Pre-diabetes    Prostate cancer (HCC)    Pseudoaneurysm of R Radial Artery    a. 06/2023 following PCI; b. 07/2023 s/p surgical repair.   Stroke [facility])     Past Surgical History:  Procedure Laterality Date   CORONARY STENT INTERVENTION N/A 06/25/2023   Procedure: CORONARY STENT INTERVENTION;  Surgeon: Sammy Crisp, MD;  Location: [facility] INVASIVE CV LAB;  Service: Cardiovascular;  Laterality: N/A;   CORONARY/GRAFT ACUTE MI REVASCULARIZATION N/A 06/22/2023   Procedure: Coronary/Graft Acute MI Revascularization;  Surgeon: Arnoldo Lapping, MD;  Location: [facility] INVASIVE CV LAB;  Service: Cardiovascular;  Laterality: N/A;   FALSE ANEURYSM REPAIR Right 07/29/2023   Procedure: REPAIR FALSE ANEURYSM (RADIAL ARTERY ANEURYSM REPAIR);  Surgeon: Celso College, MD;  Location: [facility] ORS;  Service: Vascular;  Laterality: Right;   LEFT HEART CATH AND CORONARY ANGIOGRAPHY N/A 06/22/2023   Procedure: LEFT HEART CATH AND CORONARY ANGIOGRAPHY;  Surgeon: Arnoldo Lapping, MD;  Location: [facility] INVASIVE CV LAB;  Service: Cardiovascular;  Laterality: N/A;   LEFT HEART CATH AND CORONARY ANGIOGRAPHY N/A 06/25/2023   Procedure: LEFT HEART CATH AND CORONARY ANGIOGRAPHY;  Surgeon: Sammy Crisp, MD;  Location: [facility] INVASIVE CV LAB;  Service: Cardiovascular;  Laterality: N/A;   PROSTATE SURGERY     SKIN BIOPSY     basel cell     Current Outpatient Medications:     acetaminophen  (TYLENOL ) 500 MG tablet, Take 1 tablet (500 mg total) by mouth every 6 (six) hours as needed., Disp: 30 tablet, Rfl: 0   ALPRAZolam  (XANAX ) 0.5 MG tablet, Take 1 tablet 1 hour prior to appointment & 1 tablet when arriving at the office., Disp: 2 tablet, Rfl: 0   [START ON 12/31/2023] ALPRAZolam  (XANAX ) 0.5 MG tablet, Take 1 tablet 1 hour prior to appointment & 1 tablet when arriving at the office., Disp: 2 tablet, Rfl: 0   atorvastatin  (LIPITOR ) 40 MG tablet, Take 40 mg by mouth daily., Disp: , Rfl:    Cholecalciferol (VITAMIN D ) 2000 units CAPS, Take 1 capsule (2,000 Units total) by mouth daily., Disp: 30 capsule, Rfl:    clopidogrel  (PLAVIX ) 75 MG tablet, Take 1 tablet (75 mg total) by mouth daily., Disp: 90 tablet, Rfl: 1   colchicine  0.6 MG tablet, Take 1 tablet (0.6 mg total) by mouth daily., Disp: 90 tablet, Rfl: 0   Cyanocobalamin (B-12) 500 MCG TABS, Take 1 tablet by mouth daily., Disp: 150 tablet, Rfl:    ELIQUIS  5 MG TABS tablet, Take 5 mg by mouth 2 (two) times daily., Disp: , Rfl:    lisinopril  (ZESTRIL ) 20 MG tablet, Take 1 tablet (20 mg total) by mouth daily., Disp: 90 tablet, Rfl: 3   metoprolol  succinate (TOPROL  XL) 25 MG 24 hr tablet, Take 1 tablet (25 mg total) by mouth daily., Disp: 90 tablet, Rfl: 3   Triamcinolone Acetonide (NASACORT ALLERGY 24HR NA),  Place into the nose., Disp: , Rfl:   Allergies  Allergen Reactions   Penicillin V Potassium Other (See Comments)   Penicillins Rash     Varicose veins of leg with edema, right     PLAN: The patient's right lower extremity was sterilely prepped and draped. The ultrasound machine was used to visualize the saphenous vein throughout its course. A segment in the mid to upper calf was selected for access. The saphenous vein was accessed without difficulty using ultrasound guidance with a micropuncture needle. A 0.018 wire was then placed and the needle was removed. The 65 cm sheath was then placed over the wire and the  wire and dilator were removed. The laser fiber was then placed through the sheath and its tip was placed up to the mid to proximal thigh where there were a cluster of superficial veins in the native saphenous vein dove into these veins that were very tortuous. Tumescent anesthesia was then created with a dilute lidocaine  solution. Laser energy was then delivered with constant withdrawal of the sheath and laser fiber. Approximately 895 joules of energy were delivered over a length of 21 centimeters using a 1470 Hz VenaCure machine at 7 W. Sterile dressings were placed. The patient tolerated the procedure well without obvious complications.   Follow-up in 1 week with post-laser duplex.

## 2023-12-10 DIAGNOSIS — H43813 Vitreous degeneration, bilateral: Secondary | ICD-10-CM | POA: Diagnosis not present

## 2023-12-10 DIAGNOSIS — H353131 Nonexudative age-related macular degeneration, bilateral, early dry stage: Secondary | ICD-10-CM | POA: Diagnosis not present

## 2023-12-10 DIAGNOSIS — Z961 Presence of intraocular lens: Secondary | ICD-10-CM | POA: Diagnosis not present

## 2023-12-13 ENCOUNTER — Other Ambulatory Visit (INDEPENDENT_AMBULATORY_CARE_PROVIDER_SITE_OTHER)

## 2023-12-13 DIAGNOSIS — I83891 Varicose veins of right lower extremities with other complications: Secondary | ICD-10-CM | POA: Diagnosis not present

## 2023-12-31 ENCOUNTER — Other Ambulatory Visit (INDEPENDENT_AMBULATORY_CARE_PROVIDER_SITE_OTHER): Admitting: Vascular Surgery

## 2024-01-07 ENCOUNTER — Encounter (INDEPENDENT_AMBULATORY_CARE_PROVIDER_SITE_OTHER)

## 2024-01-14 ENCOUNTER — Ambulatory Visit (INDEPENDENT_AMBULATORY_CARE_PROVIDER_SITE_OTHER): Admitting: Vascular Surgery

## 2024-01-14 ENCOUNTER — Encounter (INDEPENDENT_AMBULATORY_CARE_PROVIDER_SITE_OTHER): Payer: Self-pay | Admitting: Vascular Surgery

## 2024-01-14 VITALS — BP 132/61 | HR 64 | Resp 16 | Ht 69.0 in | Wt 197.8 lb

## 2024-01-14 DIAGNOSIS — I83893 Varicose veins of bilateral lower extremities with other complications: Secondary | ICD-10-CM | POA: Diagnosis not present

## 2024-01-14 NOTE — Progress Notes (Signed)
 Jonathan [name] is a 89 y.o. male who presents with symptomatic venous reflux  Past Medical History:  Diagnosis Date   Acute pericarditis    a. 05/2023-->colchicine  x 3 mos.   CAD (coronary artery disease)    a. 05/2023 PCI: LM nl, LAD 80p (2.75x12 Onyx DES), LCX 39m, RCA 70ost/p.   Hypercholesteremia    Hypertension    Mitral regurgitation    a. 02/2023 Echo: EF 60-65%, no rwma, mild LVH, nl RV fxn, RVSP 39.100mmHg, mod dil LA, mod MR, mild-mod TR, AoV sclerosis.   Permanent atrial fibrillation (HCC)    a. CHA2DS2VASc = 6-->eliquis .   Pre-diabetes    Prostate cancer (HCC)    Pseudoaneurysm of R Radial Artery    a. 06/2023 following PCI; b. 07/2023 s/p surgical repair.   Stroke [facility])     Past Surgical History:  Procedure Laterality Date   CORONARY STENT INTERVENTION N/A 06/25/2023   Procedure: CORONARY STENT INTERVENTION;  Surgeon: Mady Bruckner, MD;  Location: [facility] INVASIVE CV LAB;  Service: Cardiovascular;  Laterality: N/A;   CORONARY/GRAFT ACUTE MI REVASCULARIZATION N/A 06/22/2023   Procedure: Coronary/Graft Acute MI Revascularization;  Surgeon: Wonda Sharper, MD;  Location: [facility] INVASIVE CV LAB;  Service: Cardiovascular;  Laterality: N/A;   FALSE ANEURYSM REPAIR Right 07/29/2023   Procedure: REPAIR FALSE ANEURYSM (RADIAL ARTERY ANEURYSM REPAIR);  Surgeon: Marea Selinda RAMAN, MD;  Location: [facility] ORS;  Service: Vascular;  Laterality: Right;   LEFT HEART CATH AND CORONARY ANGIOGRAPHY N/A 06/22/2023   Procedure: LEFT HEART CATH AND CORONARY ANGIOGRAPHY;  Surgeon: Wonda Sharper, MD;  Location: [facility] INVASIVE CV LAB;  Service: Cardiovascular;  Laterality: N/A;   LEFT HEART CATH AND CORONARY ANGIOGRAPHY N/A 06/25/2023   Procedure: LEFT HEART CATH AND CORONARY ANGIOGRAPHY;  Surgeon: Mady Bruckner, MD;  Location: [facility] INVASIVE CV LAB;  Service: Cardiovascular;  Laterality: N/A;   PROSTATE SURGERY     SKIN BIOPSY     basel cell     Current Outpatient Medications:     acetaminophen  (TYLENOL ) 500 MG tablet, Take 1 tablet (500 mg total) by mouth every 6 (six) hours as needed., Disp: 30 tablet, Rfl: 0   ALPRAZolam  (XANAX ) 0.5 MG tablet, Take 1 tablet 1 hour prior to appointment & 1 tablet when arriving at the office., Disp: 2 tablet, Rfl: 0   atorvastatin  (LIPITOR ) 40 MG tablet, Take 40 mg by mouth daily., Disp: , Rfl:    Cholecalciferol (VITAMIN D ) 2000 units CAPS, Take 1 capsule (2,000 Units total) by mouth daily., Disp: 30 capsule, Rfl:    clopidogrel  (PLAVIX ) 75 MG tablet, Take 1 tablet (75 mg total) by mouth daily., Disp: 90 tablet, Rfl: 1   colchicine  0.6 MG tablet, Take 1 tablet (0.6 mg total) by mouth daily., Disp: 90 tablet, Rfl: 0   Cyanocobalamin (B-12) 500 MCG TABS, Take 1 tablet by mouth daily., Disp: 150 tablet, Rfl:    ELIQUIS  5 MG TABS tablet, Take 5 mg by mouth 2 (two) times daily., Disp: , Rfl:    lisinopril  (ZESTRIL ) 20 MG tablet, Take 1 tablet (20 mg total) by mouth daily., Disp: 90 tablet, Rfl: 3   metoprolol  succinate (TOPROL  XL) 25 MG 24 hr tablet, Take 1 tablet (25 mg total) by mouth daily., Disp: 90 tablet, Rfl: 3   Triamcinolone Acetonide (NASACORT ALLERGY 24HR NA), Place into the nose., Disp: , Rfl:   Allergies  Allergen Reactions   Penicillin V Potassium Other (See Comments)   Penicillins Rash     Varicose veins  of leg with edema, bilateral     PLAN: The patient's left lower extremity was sterilely prepped and draped. The ultrasound machine was used to visualize the saphenous vein throughout its course. A segment in the mid to upper calf was selected for access. The saphenous vein was accessed without difficulty using ultrasound guidance with a micropuncture needle. A 0.018 wire was then placed beyond the saphenofemoral junction and the needle was removed. The 65 cm sheath was then placed over the wire and the wire and dilator were removed. The laser fiber was then placed through the sheath and its tip was placed approximately 4-5  centimeters below the saphenofemoral junction. Tumescent anesthesia was then created with a dilute lidocaine  solution. Laser energy was then delivered with constant withdrawal of the sheath and laser fiber. Approximately 1630 joules of energy were delivered over a length of 36 centimeters using a 1470 Hz VenaCure machine at 7 W. Sterile dressings were placed. The patient tolerated the procedure well without obvious complications.   Follow-up in 1 week with post-laser duplex.

## 2024-01-21 ENCOUNTER — Encounter (INDEPENDENT_AMBULATORY_CARE_PROVIDER_SITE_OTHER)

## 2024-01-23 ENCOUNTER — Other Ambulatory Visit (INDEPENDENT_AMBULATORY_CARE_PROVIDER_SITE_OTHER)

## 2024-01-23 DIAGNOSIS — I83893 Varicose veins of bilateral lower extremities with other complications: Secondary | ICD-10-CM

## 2024-01-28 ENCOUNTER — Ambulatory Visit (INDEPENDENT_AMBULATORY_CARE_PROVIDER_SITE_OTHER): Admitting: Nurse Practitioner

## 2024-01-29 DIAGNOSIS — Z993 Dependence on wheelchair: Secondary | ICD-10-CM | POA: Diagnosis not present

## 2024-01-29 DIAGNOSIS — R4189 Other symptoms and signs involving cognitive functions and awareness: Secondary | ICD-10-CM | POA: Diagnosis not present

## 2024-01-29 DIAGNOSIS — R531 Weakness: Secondary | ICD-10-CM | POA: Diagnosis not present

## 2024-01-29 DIAGNOSIS — Z8673 Personal history of transient ischemic attack (TIA), and cerebral infarction without residual deficits: Secondary | ICD-10-CM | POA: Diagnosis not present

## 2024-01-29 DIAGNOSIS — E538 Deficiency of other specified B group vitamins: Secondary | ICD-10-CM | POA: Diagnosis not present

## 2024-02-07 ENCOUNTER — Ambulatory Visit: Payer: Medicare HMO | Admitting: Urology

## 2024-02-07 ENCOUNTER — Other Ambulatory Visit: Payer: Self-pay

## 2024-02-07 DIAGNOSIS — Z8546 Personal history of malignant neoplasm of prostate: Secondary | ICD-10-CM

## 2024-02-10 ENCOUNTER — Other Ambulatory Visit

## 2024-02-10 ENCOUNTER — Other Ambulatory Visit: Payer: Self-pay

## 2024-02-11 ENCOUNTER — Other Ambulatory Visit

## 2024-02-11 ENCOUNTER — Ambulatory Visit (INDEPENDENT_AMBULATORY_CARE_PROVIDER_SITE_OTHER): Admitting: Nurse Practitioner

## 2024-02-11 ENCOUNTER — Encounter (INDEPENDENT_AMBULATORY_CARE_PROVIDER_SITE_OTHER): Payer: Self-pay | Admitting: Nurse Practitioner

## 2024-02-11 VITALS — BP 137/81 | HR 57 | Ht 69.0 in | Wt 200.8 lb

## 2024-02-11 DIAGNOSIS — I83893 Varicose veins of bilateral lower extremities with other complications: Secondary | ICD-10-CM | POA: Diagnosis not present

## 2024-02-11 DIAGNOSIS — Z8546 Personal history of malignant neoplasm of prostate: Secondary | ICD-10-CM

## 2024-02-11 DIAGNOSIS — I10 Essential (primary) hypertension: Secondary | ICD-10-CM | POA: Diagnosis not present

## 2024-02-11 NOTE — Progress Notes (Signed)
 Subjective:    Patient ID: Jonathan [name], male    DOB: 07-16-1933, 89 y.o.   MRN: [id number] Chief Complaint  Patient presents with   Follow-up     4 and 8 week post bilateral GSV lasers. see jd/fb     The patient returns to the office for followup status post laser ablation of the left and right great saphenous vein on 12/06/2023 and 07/22/2025and . The patient notes multiple residual varicosities bilaterally which continued to hurt with dependent positions and remained tender to palpation. The patient's swelling is unchanged from preoperative status. The patient continues to wear graduated compression stockings on a daily basis but these are not eliminating the pain and discomfort. The patient continues to use over-the-counter anti-inflammatory medications to treat the pain and related symptoms but this has not given the patient relief. The patient notes the pain in the lower extremities is causing problems with daily exercise, problems at work and even with household activities such as preparing meals and doing dishes.  The patient is otherwise done well and there have been no complications related to the laser procedure or interval changes in the patient's overall   Venous ultrasound post laser shows successful laser ablation of the bilateral gsv, no DVT identified.    Review of Systems  Cardiovascular:  Positive for leg swelling.  All other systems reviewed and are negative.      Objective:   Physical Exam Vitals reviewed.  HENT:     Head: Normocephalic.  Cardiovascular:     Rate and Rhythm: Normal rate.  Pulmonary:     Effort: Pulmonary effort is normal.  Musculoskeletal:     Right lower leg: Edema present.     Left lower leg: Edema present.  Skin:    General: Skin is warm and dry.  Neurological:     Mental Status: He is alert and oriented to person, place, and time.  Psychiatric:        Mood and Affect: Mood normal.        Behavior: Behavior normal.         Thought Content: Thought content normal.        Judgment: Judgment normal.     BP 137/81   Pulse (!) 57   Ht 5' 9 (1.753 m)   Wt 200 lb 12.8 oz (91.1 kg)   BMI 29.65 kg/m   Past Medical History:  Diagnosis Date   Acute pericarditis    a. 05/2023-->colchicine  x 3 mos.   CAD (coronary artery disease)    a. 05/2023 PCI: LM nl, LAD 80p (2.75x12 Onyx DES), LCX 59m, RCA 70ost/p.   Hypercholesteremia    Hypertension    Mitral regurgitation    a. 02/2023 Echo: EF 60-65%, no rwma, mild LVH, nl RV fxn, RVSP 39.88mmHg, mod dil LA, mod MR, mild-mod TR, AoV sclerosis.   Permanent atrial fibrillation (HCC)    a. CHA2DS2VASc = 6-->eliquis .   Pre-diabetes    Prostate cancer (HCC)    Pseudoaneurysm of R Radial Artery    a. 06/2023 following PCI; b. 07/2023 s/p surgical repair.   Stroke [facility])     Social History   Socioeconomic History   Marital status: Married    Spouse name: Altamese   Number of children: Not on file   Years of education: Not on file   Highest education level: Not on file  Occupational History   Not on file  Tobacco Use   Smoking status: Former    Types:  Cigars    Quit date: 73    Years since quitting: 31.6   Smokeless tobacco: Never  Vaping Use   Vaping status: Never Used  Substance and Sexual Activity   Alcohol use: Yes    Comment: occasional/2 beers a month   Drug use: Never   Sexual activity: Yes  Other Topics Concern   Not on file  Social History Narrative   Not on file   Social Drivers of Health   Financial Resource Strain: Low Risk  (07/10/2023)   Received from [facility]   Overall Financial Resource Strain (CARDIA)    Difficulty of Paying Living Expenses: Not hard at all  Food Insecurity: No Food Insecurity (07/10/2023)   Received from [facility]   Hunger Vital Sign    Within the past 12 months, you worried that your food would run out before you got the money to buy more.: Never true    Within the past 12  months, the food you bought just didn't last and you didn't have money to get more.: Never true  Transportation Needs: No Transportation Needs (07/10/2023)   Received from [facility]   PRAPARE - Transportation    Lack of Transportation (Non-Medical): No    In the past 12 months, has lack of transportation kept you from medical appointments or from getting medications?: No  Physical Activity: Not on file  Stress: Not on file  Social Connections: Moderately Integrated (06/25/2023)   Social Connection and Isolation Panel    Frequency of Communication with Friends and Family: More than three times a week    Frequency of Social Gatherings with Friends and Family: Three times a week    Attends Religious Services: More than 4 times per year    Active Member of Clubs or Organizations: Yes    Attends Banker Meetings: More than 4 times per year    Marital Status: Widowed  Intimate Partner Violence: Not At Risk (06/22/2023)   Humiliation, Afraid, Rape, and Kick questionnaire    Fear of Current or Ex-Partner: No    Emotionally Abused: No    Physically Abused: No    Sexually Abused: No    Past Surgical History:  Procedure Laterality Date   CORONARY STENT INTERVENTION N/A 06/25/2023   Procedure: CORONARY STENT INTERVENTION;  Surgeon: Mady Bruckner, MD;  Location: [facility] INVASIVE CV LAB;  Service: Cardiovascular;  Laterality: N/A;   CORONARY/GRAFT ACUTE MI REVASCULARIZATION N/A 06/22/2023   Procedure: Coronary/Graft Acute MI Revascularization;  Surgeon: Wonda Sharper, MD;  Location: [facility] INVASIVE CV LAB;  Service: Cardiovascular;  Laterality: N/A;   FALSE ANEURYSM REPAIR Right 07/29/2023   Procedure: REPAIR FALSE ANEURYSM (RADIAL ARTERY ANEURYSM REPAIR);  Surgeon: Marea Selinda RAMAN, MD;  Location: [facility] ORS;  Service: Vascular;  Laterality: Right;   LEFT HEART CATH AND CORONARY ANGIOGRAPHY N/A 06/22/2023   Procedure: LEFT HEART CATH AND CORONARY ANGIOGRAPHY;  Surgeon:  Wonda Sharper, MD;  Location: [facility] INVASIVE CV LAB;  Service: Cardiovascular;  Laterality: N/A;   LEFT HEART CATH AND CORONARY ANGIOGRAPHY N/A 06/25/2023   Procedure: LEFT HEART CATH AND CORONARY ANGIOGRAPHY;  Surgeon: Mady Bruckner, MD;  Location: [facility] INVASIVE CV LAB;  Service: Cardiovascular;  Laterality: N/A;   PROSTATE SURGERY     SKIN BIOPSY     basel cell    Family History  Problem Relation Age of Onset   Heart disease Maternal Uncle    Heart disease Maternal Grandfather  Allergies  Allergen Reactions   Penicillin V Potassium Other (See Comments)   Penicillins Rash       Latest Ref Rng & Units 06/26/2023    5:34 AM 06/25/2023    3:26 AM 06/24/2023    3:53 AM  CBC  WBC 4.0 - 10.5 K/uL 5.6  6.1  6.1   Hemoglobin 13.0 - 17.0 g/dL 88.5  88.7  89.3   Hematocrit 39.0 - 52.0 % 34.3  34.0  32.7   Platelets 150 - 400 K/uL 216  211  169       CMP     Component Value Date/Time   NA 140 06/26/2023 0534   NA 141 07/01/2017 0951   K 3.9 06/26/2023 0534   CL 110 06/26/2023 0534   CO2 21 (L) 06/26/2023 0534   GLUCOSE 105 (H) 06/26/2023 0534   BUN 20 06/26/2023 0534   BUN 12 07/01/2017 0951   CREATININE 1.38 (H) 06/26/2023 0534   CALCIUM  8.5 (L) 06/26/2023 0534   PROT 7.3 03/01/2023 1503   PROT 7.1 07/01/2017 0951   ALBUMIN 4.2 03/01/2023 1503   ALBUMIN 4.7 07/01/2017 0951   AST 16 03/01/2023 1503   ALT 14 03/01/2023 1503   ALKPHOS 99 03/01/2023 1503   BILITOT 1.4 (H) 03/01/2023 1503   BILITOT 0.7 07/01/2017 0951   GFRNONAA 49 (L) 06/26/2023 0534     No results found.     Assessment & Plan:   1. Varicose veins of leg with edema, bilateral (Primary) Recommend:  The patient has had successful ablation of the previously incompetent saphenous venous system but still has persistent symptoms of pain and swelling that are having a negative impact on daily life and daily activities.  Patient should undergo injection sclerotherapy to treat the residual  varicosities.  The risks, benefits and alternative therapies were reviewed in detail with the patient.  All questions were answered.  The patient agrees to proceed with sclerotherapy at their convenience.  The patient will continue wearing the graduated compression stockings and using the over-the-counter pain medications to treat her symptoms.      2. Benign hypertension Continue antihypertensive medications as already ordered, these medications have been reviewed and there are no changes at this time.   Current Outpatient Medications on File Prior to Visit  Medication Sig Dispense Refill   acetaminophen  (TYLENOL ) 500 MG tablet Take 1 tablet (500 mg total) by mouth every 6 (six) hours as needed. 30 tablet 0   ALPRAZolam  (XANAX ) 0.5 MG tablet Take 1 tablet 1 hour prior to appointment & 1 tablet when arriving at the office. 2 tablet 0   atorvastatin  (LIPITOR ) 40 MG tablet Take 40 mg by mouth daily.     Cholecalciferol (VITAMIN D ) 2000 units CAPS Take 1 capsule (2,000 Units total) by mouth daily. 30 capsule    clopidogrel  (PLAVIX ) 75 MG tablet Take 1 tablet (75 mg total) by mouth daily. 90 tablet 1   Cyanocobalamin (B-12) 500 MCG TABS Take 1 tablet by mouth daily. 150 tablet    ELIQUIS  5 MG TABS tablet Take 5 mg by mouth 2 (two) times daily.     lisinopril  (ZESTRIL ) 20 MG tablet Take 1 tablet (20 mg total) by mouth daily. 90 tablet 3   metoprolol  succinate (TOPROL  XL) 25 MG 24 hr tablet Take 1 tablet (25 mg total) by mouth daily. 90 tablet 3   Triamcinolone Acetonide (NASACORT ALLERGY 24HR NA) Place into the nose.     colchicine  0.6 MG tablet Take  1 tablet (0.6 mg total) by mouth daily. (Patient not taking: Reported on 02/11/2024) 90 tablet 0   No current facility-administered medications on file prior to visit.    There are no Patient Instructions on file for this visit. No follow-ups on file.   Masami Plata E Jacson Rapaport, NP

## 2024-02-12 LAB — PSA: Prostate Specific Ag, Serum: 0.6 ng/mL (ref 0.0–4.0)

## 2024-02-14 ENCOUNTER — Ambulatory Visit: Payer: Self-pay | Admitting: Urology

## 2024-02-14 VITALS — BP 148/75 | HR 58 | Ht 69.0 in | Wt 192.0 lb

## 2024-02-14 DIAGNOSIS — Z8546 Personal history of malignant neoplasm of prostate: Secondary | ICD-10-CM

## 2024-02-14 NOTE — Progress Notes (Signed)
 02/14/2024 11:11 AM   [name] [facility] October 04, 1933 [id number]  Referring provider: Jeffie Cheryl BRAVO, MD [address] [place],  [place] [address]  Chief Complaint  Patient presents with   History of prostate cancer    Urologic history:  1. T1c adenocarcinoma the prostate  low risk; dx May 2007, tx radiation-IMRT, PSA 6.5   2. Microhematuria  found to have a bulb stricture and underwent dilation.  HPI: Jonathan [name] is a 89 y.o. male presents for annual follow-up.  Since last visit had a CVA, MI with stent and pericarditis Occasional urinary urgency and decreased stream Denies dysuria, gross hematuria No flank, abdominal or pelvic pain PSA 02/11/2024 was 0.6  PSA trend    Prostate Specific Ag, Serum  Latest Ref Rng 0.0 - 4.0 ng/mL  01/19/2019 0.3   01/27/2020 0.7   08/01/2020 0.5   01/31/2021 0.8   08/08/2021 0.8   01/30/2022 0.7   02/05/2023 0.8   02/11/2024 0.6     PMH: Past Medical History:  Diagnosis Date   Acute pericarditis    a. 05/2023-->colchicine  x 3 mos.   CAD (coronary artery disease)    a. 05/2023 PCI: LM nl, LAD 80p (2.75x12 Onyx DES), LCX 63m, RCA 70ost/p.   Hypercholesteremia    Hypertension    Mitral regurgitation    a. 02/2023 Echo: EF 60-65%, no rwma, mild LVH, nl RV fxn, RVSP 39.47mmHg, mod dil LA, mod MR, mild-mod TR, AoV sclerosis.   Permanent atrial fibrillation (HCC)    a. CHA2DS2VASc = 6-->eliquis .   Pre-diabetes    Prostate cancer (HCC)    Pseudoaneurysm of R Radial Artery    a. 06/2023 following PCI; b. 07/2023 s/p surgical repair.   Stroke [facility])     Surgical History: Past Surgical History:  Procedure Laterality Date   CORONARY STENT INTERVENTION N/A 06/25/2023   Procedure: CORONARY STENT INTERVENTION;  Surgeon: Mady Bruckner, MD;  Location: [facility] INVASIVE CV LAB;  Service: Cardiovascular;  Laterality: N/A;   CORONARY/GRAFT ACUTE MI REVASCULARIZATION N/A 06/22/2023   Procedure: Coronary/Graft Acute MI Revascularization;   Surgeon: Wonda Sharper, MD;  Location: [facility] INVASIVE CV LAB;  Service: Cardiovascular;  Laterality: N/A;   FALSE ANEURYSM REPAIR Right 07/29/2023   Procedure: REPAIR FALSE ANEURYSM (RADIAL ARTERY ANEURYSM REPAIR);  Surgeon: Marea Selinda RAMAN, MD;  Location: [facility] ORS;  Service: Vascular;  Laterality: Right;   LEFT HEART CATH AND CORONARY ANGIOGRAPHY N/A 06/22/2023   Procedure: LEFT HEART CATH AND CORONARY ANGIOGRAPHY;  Surgeon: Wonda Sharper, MD;  Location: [facility] INVASIVE CV LAB;  Service: Cardiovascular;  Laterality: N/A;   LEFT HEART CATH AND CORONARY ANGIOGRAPHY N/A 06/25/2023   Procedure: LEFT HEART CATH AND CORONARY ANGIOGRAPHY;  Surgeon: Mady Bruckner, MD;  Location: [facility] INVASIVE CV LAB;  Service: Cardiovascular;  Laterality: N/A;   PROSTATE SURGERY     SKIN BIOPSY     basel cell    Home Medications:  Allergies as of 02/14/2024       Reactions   Penicillin V Potassium Other (See Comments)   Penicillins Rash        Medication List        Accurate as of February 14, 2024 11:11 AM. If you have any questions, ask your nurse or doctor.          acetaminophen  500 MG tablet Commonly known as: TYLENOL  Take 1 tablet (500 mg total) by mouth every 6 (six) hours as needed.   ALPRAZolam  0.5 MG tablet Commonly known as: Xanax   Take 1 tablet 1 hour prior to appointment & 1 tablet when arriving at the office.   atorvastatin  40 MG tablet Commonly known as: LIPITOR  Take 40 mg by mouth daily.   B-12 500 MCG Tabs Take 1 tablet by mouth daily.   clopidogrel  75 MG tablet Commonly known as: PLAVIX  Take 1 tablet (75 mg total) by mouth daily.   colchicine  0.6 MG tablet Take 1 tablet (0.6 mg total) by mouth daily.   Eliquis  5 MG Tabs tablet Generic drug: apixaban  Take 5 mg by mouth 2 (two) times daily.   lisinopril  20 MG tablet Commonly known as: ZESTRIL  Take 1 tablet (20 mg total) by mouth daily.   metoprolol  succinate 25 MG 24 hr tablet Commonly known as: Toprol  XL Take 1  tablet (25 mg total) by mouth daily.   NASACORT ALLERGY 24HR NA Place into the nose.   Vitamin D  50 MCG (2000 UT) Caps Take 1 capsule (2,000 Units total) by mouth daily.        Allergies:  Allergies  Allergen Reactions   Penicillin V Potassium Other (See Comments)   Penicillins Rash    Family History: Family History  Problem Relation Age of Onset   Heart disease Maternal Uncle    Heart disease Maternal Grandfather     Social History:  reports that he quit smoking about 31 years ago. His smoking use included cigars. He has never used smokeless tobacco. He reports current alcohol use. He reports that he does not use drugs.   Physical Exam: BP (!) 148/75 (BP Location: Left Arm, Patient Position: Sitting, Cuff Size: Normal)   Pulse (!) 58   Ht 5' 9 (1.753 m)   Wt 192 lb (87.1 kg)   SpO2 98%   BMI 28.35 kg/m   Constitutional:  Alert, No acute distress. HEENT: [place] AT Respiratory: Normal respiratory effort, no increased work of breathing.  Psychiatric: Normal mood and affect.   Assessment & Plan:    1.  History prostate cancer Stable PSA At this point would be okay to stop PSA checks.    [name], MD  [facility] [address] [place], [place] [address] [phone]

## 2024-02-15 ENCOUNTER — Encounter: Payer: Self-pay | Admitting: Urology

## 2024-02-16 ENCOUNTER — Other Ambulatory Visit: Payer: Self-pay | Admitting: Cardiology

## 2024-02-17 NOTE — Telephone Encounter (Signed)
 Prescription refill request for Eliquis  received. Indication:afib Last office visit:5/25 Scr:1.4  3/25 Age: 89 Weight:87.1  kg  Prescription refilled

## 2024-02-27 DIAGNOSIS — E119 Type 2 diabetes mellitus without complications: Secondary | ICD-10-CM | POA: Diagnosis not present

## 2024-02-27 DIAGNOSIS — J309 Allergic rhinitis, unspecified: Secondary | ICD-10-CM | POA: Diagnosis not present

## 2024-02-27 DIAGNOSIS — Z0000 Encounter for general adult medical examination without abnormal findings: Secondary | ICD-10-CM | POA: Diagnosis not present

## 2024-02-27 DIAGNOSIS — N1831 Chronic kidney disease, stage 3a: Secondary | ICD-10-CM | POA: Diagnosis not present

## 2024-02-27 DIAGNOSIS — E7800 Pure hypercholesterolemia, unspecified: Secondary | ICD-10-CM | POA: Diagnosis not present

## 2024-02-27 DIAGNOSIS — I4891 Unspecified atrial fibrillation: Secondary | ICD-10-CM | POA: Diagnosis not present

## 2024-02-27 DIAGNOSIS — I309 Acute pericarditis, unspecified: Secondary | ICD-10-CM | POA: Diagnosis not present

## 2024-02-27 DIAGNOSIS — I10 Essential (primary) hypertension: Secondary | ICD-10-CM | POA: Diagnosis not present

## 2024-02-27 DIAGNOSIS — I2510 Atherosclerotic heart disease of native coronary artery without angina pectoris: Secondary | ICD-10-CM | POA: Diagnosis not present

## 2024-03-02 ENCOUNTER — Other Ambulatory Visit: Payer: Self-pay | Admitting: Cardiology

## 2024-03-05 ENCOUNTER — Telehealth (INDEPENDENT_AMBULATORY_CARE_PROVIDER_SITE_OTHER): Payer: Self-pay | Admitting: Nurse Practitioner

## 2024-03-05 NOTE — Telephone Encounter (Signed)
 LVM for pt on cell phone to return call to schedule for SALINE sclero with Orvin Daring, NP. Spoke with wife on home phone and she states that she will have him call to schedule.   bilateral SALINE sclero. see fb. shara #[id number]. exp: 9.2.25 - 12.1.25 - 6 units

## 2024-03-13 DIAGNOSIS — R17 Unspecified jaundice: Secondary | ICD-10-CM | POA: Diagnosis not present

## 2024-03-16 DIAGNOSIS — H6983 Other specified disorders of Eustachian tube, bilateral: Secondary | ICD-10-CM | POA: Diagnosis not present

## 2024-03-16 DIAGNOSIS — R438 Other disturbances of smell and taste: Secondary | ICD-10-CM | POA: Diagnosis not present

## 2024-03-16 DIAGNOSIS — H93293 Other abnormal auditory perceptions, bilateral: Secondary | ICD-10-CM | POA: Diagnosis not present

## 2024-03-16 DIAGNOSIS — H6123 Impacted cerumen, bilateral: Secondary | ICD-10-CM | POA: Diagnosis not present

## 2024-03-16 DIAGNOSIS — J310 Chronic rhinitis: Secondary | ICD-10-CM | POA: Diagnosis not present

## 2024-03-18 DIAGNOSIS — E7800 Pure hypercholesterolemia, unspecified: Secondary | ICD-10-CM | POA: Diagnosis not present

## 2024-03-18 DIAGNOSIS — I2510 Atherosclerotic heart disease of native coronary artery without angina pectoris: Secondary | ICD-10-CM | POA: Diagnosis not present

## 2024-03-18 DIAGNOSIS — N1831 Chronic kidney disease, stage 3a: Secondary | ICD-10-CM | POA: Diagnosis not present

## 2024-03-18 DIAGNOSIS — E119 Type 2 diabetes mellitus without complications: Secondary | ICD-10-CM | POA: Diagnosis not present

## 2024-03-18 DIAGNOSIS — I10 Essential (primary) hypertension: Secondary | ICD-10-CM | POA: Diagnosis not present

## 2024-03-18 DIAGNOSIS — I4891 Unspecified atrial fibrillation: Secondary | ICD-10-CM | POA: Diagnosis not present

## 2024-03-27 ENCOUNTER — Encounter (INDEPENDENT_AMBULATORY_CARE_PROVIDER_SITE_OTHER): Payer: Self-pay | Admitting: Nurse Practitioner

## 2024-03-27 ENCOUNTER — Ambulatory Visit (INDEPENDENT_AMBULATORY_CARE_PROVIDER_SITE_OTHER): Admitting: Nurse Practitioner

## 2024-03-27 VITALS — BP 137/78 | HR 54 | Resp 16 | Ht 69.0 in | Wt 202.6 lb

## 2024-03-27 DIAGNOSIS — I83893 Varicose veins of bilateral lower extremities with other complications: Secondary | ICD-10-CM

## 2024-03-27 NOTE — Progress Notes (Signed)
 Varicose veins of bilateral  lower extremity with inflammation (454.1  I83.10) Current Plans   Indication: Patient presents with symptomatic varicose veins of the bilateral  lower extremity.   Procedure: Sclerotherapy using hypertonic saline mixed with 1% Lidocaine was performed on the bilateral lower extremity. Compression wraps were placed. The patient tolerated the procedure well.

## 2024-04-17 DIAGNOSIS — R7989 Other specified abnormal findings of blood chemistry: Secondary | ICD-10-CM | POA: Diagnosis not present

## 2024-04-28 ENCOUNTER — Other Ambulatory Visit: Payer: Self-pay | Admitting: Family Medicine

## 2024-04-28 DIAGNOSIS — R7989 Other specified abnormal findings of blood chemistry: Secondary | ICD-10-CM

## 2024-04-28 DIAGNOSIS — R17 Unspecified jaundice: Secondary | ICD-10-CM

## 2024-04-29 ENCOUNTER — Ambulatory Visit (INDEPENDENT_AMBULATORY_CARE_PROVIDER_SITE_OTHER): Admitting: Nurse Practitioner

## 2024-04-29 ENCOUNTER — Encounter (INDEPENDENT_AMBULATORY_CARE_PROVIDER_SITE_OTHER): Payer: Self-pay | Admitting: Nurse Practitioner

## 2024-04-29 VITALS — BP 124/74 | HR 62 | Resp 16 | Ht 69.0 in | Wt 199.8 lb

## 2024-04-29 DIAGNOSIS — I83893 Varicose veins of bilateral lower extremities with other complications: Secondary | ICD-10-CM | POA: Diagnosis not present

## 2024-04-29 NOTE — Progress Notes (Signed)
 Varicose veins of bilateral  lower extremity with inflammation (454.1  I83.10) Current Plans   Indication: Patient presents with symptomatic varicose veins of the bilateral  lower extremity.   Procedure: Sclerotherapy using hypertonic saline mixed with 1% Lidocaine was performed on the bilateral lower extremity. Compression wraps were placed. The patient tolerated the procedure well.

## 2024-04-30 ENCOUNTER — Ambulatory Visit
Admission: RE | Admit: 2024-04-30 | Discharge: 2024-04-30 | Disposition: A | Discharge status: Home / Self Care | Source: Ambulatory Visit | Attending: Family Medicine | Admitting: Family Medicine

## 2024-04-30 DIAGNOSIS — R7989 Other specified abnormal findings of blood chemistry: Secondary | ICD-10-CM | POA: Insufficient documentation

## 2024-04-30 DIAGNOSIS — N281 Cyst of kidney, acquired: Secondary | ICD-10-CM | POA: Diagnosis not present

## 2024-04-30 DIAGNOSIS — R17 Unspecified jaundice: Secondary | ICD-10-CM | POA: Insufficient documentation

## 2024-04-30 DIAGNOSIS — K8020 Calculus of gallbladder without cholecystitis without obstruction: Secondary | ICD-10-CM | POA: Diagnosis not present

## 2024-05-08 ENCOUNTER — Other Ambulatory Visit: Payer: Self-pay | Admitting: Family Medicine

## 2024-05-08 DIAGNOSIS — R7989 Other specified abnormal findings of blood chemistry: Secondary | ICD-10-CM

## 2024-05-08 DIAGNOSIS — R932 Abnormal findings on diagnostic imaging of liver and biliary tract: Secondary | ICD-10-CM

## 2024-05-08 DIAGNOSIS — K8020 Calculus of gallbladder without cholecystitis without obstruction: Secondary | ICD-10-CM

## 2024-05-13 ENCOUNTER — Encounter: Payer: Self-pay | Admitting: Family Medicine

## 2024-05-13 DIAGNOSIS — K8020 Calculus of gallbladder without cholecystitis without obstruction: Secondary | ICD-10-CM

## 2024-05-13 DIAGNOSIS — R7989 Other specified abnormal findings of blood chemistry: Secondary | ICD-10-CM

## 2024-05-13 DIAGNOSIS — R932 Abnormal findings on diagnostic imaging of liver and biliary tract: Secondary | ICD-10-CM

## 2024-05-20 ENCOUNTER — Ambulatory Visit (INDEPENDENT_AMBULATORY_CARE_PROVIDER_SITE_OTHER): Admitting: Nurse Practitioner

## 2024-05-26 DIAGNOSIS — R17 Unspecified jaundice: Secondary | ICD-10-CM | POA: Diagnosis not present

## 2024-05-26 DIAGNOSIS — R7989 Other specified abnormal findings of blood chemistry: Secondary | ICD-10-CM | POA: Diagnosis not present

## 2024-05-28 DIAGNOSIS — L570 Actinic keratosis: Secondary | ICD-10-CM | POA: Diagnosis not present

## 2024-05-28 DIAGNOSIS — Z85828 Personal history of other malignant neoplasm of skin: Secondary | ICD-10-CM | POA: Diagnosis not present

## 2024-05-28 DIAGNOSIS — Z872 Personal history of diseases of the skin and subcutaneous tissue: Secondary | ICD-10-CM | POA: Diagnosis not present

## 2024-05-28 DIAGNOSIS — L578 Other skin changes due to chronic exposure to nonionizing radiation: Secondary | ICD-10-CM | POA: Diagnosis not present

## 2024-05-28 DIAGNOSIS — Z859 Personal history of malignant neoplasm, unspecified: Secondary | ICD-10-CM | POA: Diagnosis not present

## 2024-05-28 DIAGNOSIS — Z86018 Personal history of other benign neoplasm: Secondary | ICD-10-CM | POA: Diagnosis not present

## 2024-05-29 ENCOUNTER — Ambulatory Visit (INDEPENDENT_AMBULATORY_CARE_PROVIDER_SITE_OTHER): Admitting: Nurse Practitioner

## 2024-06-04 ENCOUNTER — Other Ambulatory Visit

## 2024-06-09 ENCOUNTER — Ambulatory Visit
Admission: RE | Admit: 2024-06-09 | Discharge: 2024-06-09 | Disposition: A | Source: Ambulatory Visit | Attending: Family Medicine | Admitting: Family Medicine

## 2024-06-09 DIAGNOSIS — K8020 Calculus of gallbladder without cholecystitis without obstruction: Secondary | ICD-10-CM

## 2024-06-09 DIAGNOSIS — R7989 Other specified abnormal findings of blood chemistry: Secondary | ICD-10-CM

## 2024-06-09 DIAGNOSIS — R932 Abnormal findings on diagnostic imaging of liver and biliary tract: Secondary | ICD-10-CM

## 2024-06-24 ENCOUNTER — Other Ambulatory Visit (INDEPENDENT_AMBULATORY_CARE_PROVIDER_SITE_OTHER): Payer: Self-pay | Admitting: Vascular Surgery

## 2024-06-24 DIAGNOSIS — I6523 Occlusion and stenosis of bilateral carotid arteries: Secondary | ICD-10-CM

## 2024-07-03 ENCOUNTER — Ambulatory Visit (INDEPENDENT_AMBULATORY_CARE_PROVIDER_SITE_OTHER): Payer: Medicare HMO

## 2024-07-03 ENCOUNTER — Ambulatory Visit (INDEPENDENT_AMBULATORY_CARE_PROVIDER_SITE_OTHER): Payer: Medicare HMO | Admitting: Vascular Surgery

## 2024-07-03 DIAGNOSIS — I6523 Occlusion and stenosis of bilateral carotid arteries: Secondary | ICD-10-CM | POA: Diagnosis not present

## 2024-07-21 ENCOUNTER — Encounter (INDEPENDENT_AMBULATORY_CARE_PROVIDER_SITE_OTHER): Payer: Self-pay | Admitting: Vascular Surgery

## 2024-07-21 ENCOUNTER — Ambulatory Visit (INDEPENDENT_AMBULATORY_CARE_PROVIDER_SITE_OTHER): Admitting: Vascular Surgery

## 2024-07-21 VITALS — BP 139/81 | HR 60 | Resp 18 | Wt 205.0 lb

## 2024-07-21 DIAGNOSIS — E785 Hyperlipidemia, unspecified: Secondary | ICD-10-CM

## 2024-07-21 DIAGNOSIS — I6523 Occlusion and stenosis of bilateral carotid arteries: Secondary | ICD-10-CM | POA: Diagnosis not present

## 2024-07-21 DIAGNOSIS — I10 Essential (primary) hypertension: Secondary | ICD-10-CM | POA: Diagnosis not present

## 2024-07-21 DIAGNOSIS — I83893 Varicose veins of bilateral lower extremities with other complications: Secondary | ICD-10-CM | POA: Diagnosis not present

## 2024-07-21 NOTE — Assessment & Plan Note (Signed)
 blood pressure control important in reducing the progression of atherosclerotic disease. On appropriate oral medications.

## 2024-07-21 NOTE — Progress Notes (Signed)
 "     MRN : [id number]  Jonathan [name] is a 89 y.o. (Mar 01, 1934) male who presents with chief complaint of  Chief Complaint  Patient presents with   Follow-up    11yr carotid results  .  History of Present Illness:   Discussed the use of AI scribe software for clinical note transcription with the patient, who gave verbal consent to proceed.  History of Present Illness Jonathan [name] is an 89 year old male with bilateral lower extremity varicose veins and prior vascular interventions who presents for vascular surgery follow-up.  His lower extremity symptoms have improved after endovenous ablation and sclerotherapy, with no new pain, swelling, or skin changes. He notes increased warmth in his feet since vascular stent placement, while his hands remain cold.  He has ongoing back pain that he attributes to cold exposure rather than a vascular cause. No other musculoskeletal symptoms.  He recently had a carotid artery ultrasound performed.  This showed some mild intimal thickening with no significant stenosis and nearly normal findings.    Results Diagnostic Carotid artery ultrasound: Near normal with mild intimal-medial thickening, no significant stenosis or abnormal findings  Current Outpatient Medications  Medication Sig Dispense Refill   acetaminophen  (TYLENOL ) 500 MG tablet Take 1 tablet (500 mg total) by mouth every 6 (six) hours as needed. 30 tablet 0   ALPRAZolam  (XANAX ) 0.5 MG tablet Take 1 tablet 1 hour prior to appointment & 1 tablet when arriving at the office. 2 tablet 0   atorvastatin  (LIPITOR ) 40 MG tablet Take 40 mg by mouth daily.     Cholecalciferol (VITAMIN D ) 2000 units CAPS Take 1 capsule (2,000 Units total) by mouth daily. 30 capsule    Cyanocobalamin (B-12) 500 MCG TABS Take 1 tablet by mouth daily. 150 tablet    ELIQUIS  5 MG TABS tablet TAKE 1 TABLET BY MOUTH TWICE A DAY 120 tablet 3   lisinopril  (ZESTRIL ) 20 MG tablet TAKE 1 TABLET BY MOUTH EVERY DAY 90 tablet  2   metoprolol  succinate (TOPROL  XL) 25 MG 24 hr tablet Take 1 tablet (25 mg total) by mouth daily. 90 tablet 3   Triamcinolone Acetonide (NASACORT ALLERGY 24HR NA) Place into the nose.     clopidogrel  (PLAVIX ) 75 MG tablet Take 1 tablet (75 mg total) by mouth daily. 90 tablet 1   colchicine  0.6 MG tablet Take 1 tablet (0.6 mg total) by mouth daily. (Patient not taking: Reported on 07/21/2024) 90 tablet 0   No current facility-administered medications for this visit.    Past Medical History:  Diagnosis Date   Acute pericarditis    a. 05/2023-->colchicine  x 3 mos.   CAD (coronary artery disease)    a. 05/2023 PCI: LM nl, LAD 80p (2.75x12 Onyx DES), LCX 69m, RCA 70ost/p.   Hypercholesteremia    Hypertension    Mitral regurgitation    a. 02/2023 Echo: EF 60-65%, no rwma, mild LVH, nl RV fxn, RVSP 39.95mmHg, mod dil LA, mod MR, mild-mod TR, AoV sclerosis.   Permanent atrial fibrillation (HCC)    a. CHA2DS2VASc = 6-->eliquis .   Pre-diabetes    Prostate cancer (HCC)    Pseudoaneurysm of R Radial Artery    a. 06/2023 following PCI; b. 07/2023 s/p surgical repair.   Stroke [facility])     Past Surgical History:  Procedure Laterality Date   CORONARY STENT INTERVENTION N/A 06/25/2023   Procedure: CORONARY STENT INTERVENTION;  Surgeon: Mady Bruckner, MD;  Location: [facility] INVASIVE CV LAB;  Service: Cardiovascular;  Laterality: N/A;   CORONARY/GRAFT ACUTE MI REVASCULARIZATION N/A 06/22/2023   Procedure: Coronary/Graft Acute MI Revascularization;  Surgeon: Wonda Sharper, MD;  Location[facility] INVASIVE CV LAB;  Service: Cardiovascular;  Laterality: N/A;   FALSE ANEURYSM REPAIR Right 07/29/2023   Procedure: REPAIR FALSE ANEURYSM (RADIAL ARTERY ANEURYSM REPAIR);  Surgeon: Marea Selinda RAMAN, MD;  Location: [facility] ORS;  Service: Vascular;  Laterality: Right;   LEFT HEART CATH AND CORONARY ANGIOGRAPHY N/A 06/22/2023   Procedure: LEFT HEART CATH AND CORONARY ANGIOGRAPHY;  Surgeon: Wonda Sharper, MD;  Location: [facility]  INVASIVE CV LAB;  Service: Cardiovascular;  Laterality: N/A;   LEFT HEART CATH AND CORONARY ANGIOGRAPHY N/A 06/25/2023   Procedure: LEFT HEART CATH AND CORONARY ANGIOGRAPHY;  Surgeon: Mady Bruckner, MD;  Location: [facility] INVASIVE CV LAB;  Service: Cardiovascular;  Laterality: N/A;   PROSTATE SURGERY     SKIN BIOPSY     basel cell     Social History[1]    Family History  Problem Relation Age of Onset   Heart disease Maternal Uncle    Heart disease Maternal Grandfather      Allergies[2]   REVIEW OF SYSTEMS (Negative unless checked)  Constitutional: [] Weight loss  [] Fever  [] Chills Cardiac: [] Chest pain   [] Chest pressure   [] Palpitations   [] Shortness of breath when laying flat   [] Shortness of breath at rest   [] Shortness of breath with exertion. Vascular:  [] Pain in legs with walking   [] Pain in legs at rest   [] Pain in legs when laying flat   [] Claudication   [] Pain in feet when walking  [] Pain in feet at rest  [] Pain in feet when laying flat   [] History of DVT   [] Phlebitis   [x] Swelling in legs   [x] Varicose veins   [] Non-healing ulcers Pulmonary:   [] Uses home oxygen   [] Productive cough   [] Hemoptysis   [] Wheeze  [] COPD   [] Asthma Neurologic:  [] Dizziness  [] Blackouts   [] Seizures   [] History of stroke   [] History of TIA  [] Aphasia   [] Temporary blindness   [] Dysphagia   [] Weakness or numbness in arms   [] Weakness or numbness in legs Musculoskeletal:  [] Arthritis   [] Joint swelling   [x] Joint pain   [] Low back pain Hematologic:  [] Easy bruising  [] Easy bleeding   [] Hypercoagulable state   [] Anemic   Gastrointestinal:  [] Blood in stool   [] Vomiting blood  [] Gastroesophageal reflux/heartburn   [] Abdominal pain Genitourinary:  [] Chronic kidney disease   [] Difficult urination  [] Frequent urination  [] Burning with urination   [] Hematuria Skin:  [] Rashes   [] Ulcers   [] Wounds Psychological:  [] History of anxiety   []  History of major depression.  Physical Examination  BP 139/81  (BP Location: Left Arm)   Pulse 60   Resp 18   Wt 205 lb (93 kg)   BMI 30.27 kg/m  Gen:  WD/WN, NAD.  Appears markedly younger than stated age Head: [place]/AT, No temporalis wasting. Ear/Nose/Throat: Hearing grossly intact, nares w/o erythema or drainage Eyes: Conjunctiva clear. Sclera non-icteric Neck: Supple.  Trachea midline Pulmonary:  Good air movement, no use of accessory muscles.  Cardiac: RRR, no JVD Vascular:  Vessel Right Left  Radial Palpable Palpable                          PT Palpable Palpable  DP Palpable Palpable   Gastrointestinal: soft, non-tender/non-distended. No guarding/reflex.  Musculoskeletal: M/S 5/5 throughout.  No deformity or atrophy.  Trace lower extremity  edema. Neurologic: Sensation grossly intact in extremities.  Symmetrical.  Speech is fluent.  Psychiatric: Judgment intact, Mood & affect appropriate for pt's clinical situation. Dermatologic: No rashes or ulcers noted.  No cellulitis or open wounds.  Physical Exam     Labs No results found for this or any previous visit (from the past 2160 hours).  Radiology VAS US  CAROTID Result Date: 07/06/2024 Carotid Arterial Duplex Study Patient Name:  MARICUS TANZI  Date of Exam:   07/03/2024 Medical Rec #: [id number]       Accession #:    [id number] Date of Birth: [birth year]       Patient Gender: M Patient Age:   89 years Exam Location:  [facility] Procedure:      VAS US  CAROTID Referring Phys: SELINDA GU --------------------------------------------------------------------------------  Indications: Carotid artery disease. Performing Technologist: Elsie Churn RT, RDMS, RVT  Examination Guidelines: A complete evaluation includes B-mode imaging, spectral Doppler, color Doppler, and power Doppler as needed of all accessible portions of each vessel. Bilateral testing is considered an integral part of a complete examination. Limited examinations for reoccurring indications may be performed as noted.   Right Carotid Findings: +----------+--------+--------+--------+------------------+------------------+           PSV cm/sEDV cm/sStenosisPlaque DescriptionComments           +----------+--------+--------+--------+------------------+------------------+ CCA Prox  86      14                                                   +----------+--------+--------+--------+------------------+------------------+ CCA Mid   86      14                                                   +----------+--------+--------+--------+------------------+------------------+ CCA Distal79      15                                intimal thickening +----------+--------+--------+--------+------------------+------------------+ ICA Prox  58      12      1-39%   heterogenous                         +----------+--------+--------+--------+------------------+------------------+ ICA Mid   81      21                                                   +----------+--------+--------+--------+------------------+------------------+ ICA Distal75      20                                                   +----------+--------+--------+--------+------------------+------------------+ ECA       116     9               homogeneous                          +----------+--------+--------+--------+------------------+------------------+ +----------+--------+-------+----------------+-------------------+  PSV cm/sEDV cmsDescribe        Arm Pressure (mmHG) +----------+--------+-------+----------------+-------------------+ [id number]             Multiphasic, WNL                    +----------+--------+-------+----------------+-------------------+ +---------+--------+--+--------+---------+ VertebralPSV cm/s34EDV cm/sAntegrade +---------+--------+--+--------+---------+ Left Carotid Findings: +----------+--------+--------+--------+------------------+--------+           PSV cm/sEDV  cm/sStenosisPlaque DescriptionComments +----------+--------+--------+--------+------------------+--------+ CCA Prox  104     16                                         +----------+--------+--------+--------+------------------+--------+ CCA Mid   81      13                                         +----------+--------+--------+--------+------------------+--------+ CCA Distal74      14              heterogenous               +----------+--------+--------+--------+------------------+--------+ ICA Prox  59      13      1-39%   heterogenous               +----------+--------+--------+--------+------------------+--------+ ICA Mid   75      18                                         +----------+--------+--------+--------+------------------+--------+ ICA Distal71      16                                         +----------+--------+--------+--------+------------------+--------+ ECA       74                                                 +----------+--------+--------+--------+------------------+--------+ +----------+--------+--------+----------------+-------------------+           PSV cm/sEDV cm/sDescribe        Arm Pressure (mmHG) +----------+--------+--------+----------------+-------------------+ [id number]              Multiphasic, WNL                    +----------+--------+--------+----------------+-------------------+ +---------+--------+--+--------+---------+ VertebralPSV cm/s46EDV cm/sAntegrade +---------+--------+--+--------+---------+  Summary: Right Carotid: The extracranial vessels were near-normal with only minimal wall                thickening or plaque. Left Carotid: The extracranial vessels were near-normal with only minimal wall               thickening or plaque. No significant change when compared to the previous exam on 07/02/23. *See table(s) above for measurements and observations.  Electronically signed by Selinda Gu MD on 07/06/2024  at 8:28:53 AM.    Final     Assessment/Plan Assessment & Plan Varicose veins of bilateral lower extremities with edema Bilateral lower extremity varicose veins with edema are well-controlled post-ablation and sclerotherapy. No new symptoms or complications. - Reviewed  progress and confirmed symptom control. - Provided information on compression stocking measurement and purchase locations.  Carotid artery disease Duplex showed nearly normal findings with mild intimal thickening and no significant stenosis.  In a patient approaching 89, I do not think this is something we will likely need to follow ongoing.  The likelihood of progression to a high-grade range in the next decade is quite small.  Benign hypertension blood pressure control important in reducing the progression of atherosclerotic disease. On appropriate oral medications.   Hyperlipidemia lipid control important in reducing the progression of atherosclerotic disease. Continue statin therapy  Selinda Gu, MD  07/21/2024 9:44 AM    This note was created with Dragon medical transcription system.  Any errors from dictation are purely unintentional     [1]  Social History Tobacco Use   Smoking status: Former    Types: Cigars    Quit date: 1994    Years since quitting: 32.0   Smokeless tobacco: Never  Vaping Use   Vaping status: Never Used  Substance Use Topics   Alcohol use: Yes    Comment: occasional/2 beers a month   Drug use: Never  [2]  Allergies Allergen Reactions   Penicillin V Potassium Other (See Comments)   Penicillins Rash   "

## 2024-07-21 NOTE — Assessment & Plan Note (Signed)
 lipid control important in reducing the progression of atherosclerotic disease. Continue statin therapy

## 2025-02-12 ENCOUNTER — Other Ambulatory Visit

## 2025-02-16 ENCOUNTER — Ambulatory Visit: Admitting: Urology
